# Patient Record
Sex: Female | Born: 1990 | Race: Black or African American | Hispanic: No | Marital: Single | Smoking: Light tobacco smoker
Health system: Southern US, Community
[De-identification: ages and names within clinical notes are randomized; demographics above are authoritative.]

## PROBLEM LIST (undated history)

## (undated) ENCOUNTER — Inpatient Hospital Stay (HOSPITAL_COMMUNITY): Payer: Self-pay

## (undated) DIAGNOSIS — J45909 Unspecified asthma, uncomplicated: Secondary | ICD-10-CM

## (undated) DIAGNOSIS — O1490 Unspecified pre-eclampsia, unspecified trimester: Secondary | ICD-10-CM

## (undated) HISTORY — PX: TONSILLECTOMY: SUR1361

---
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
## 2010-03-16 ENCOUNTER — Ambulatory Visit: Payer: Self-pay | Admitting: Obstetrics and Gynecology

## 2010-03-17 ENCOUNTER — Encounter: Payer: Self-pay | Admitting: Obstetrics and Gynecology

## 2010-06-14 ENCOUNTER — Ambulatory Visit: Payer: Self-pay | Admitting: Obstetrics and Gynecology

## 2010-06-14 ENCOUNTER — Inpatient Hospital Stay (HOSPITAL_COMMUNITY): Admission: AD | Admit: 2010-06-14 | Discharge: 2010-06-14 | Payer: Self-pay | Admitting: Obstetrics & Gynecology

## 2011-02-19 LAB — GC/CHLAMYDIA PROBE AMP, GENITAL
Chlamydia, DNA Probe: NEGATIVE
GC Probe Amp, Genital: NEGATIVE

## 2011-02-19 LAB — URINALYSIS, ROUTINE W REFLEX MICROSCOPIC
Glucose, UA: NEGATIVE mg/dL
Specific Gravity, Urine: <1.005 — ABNORMAL LOW (ref 1.005–1.030)
pH: 5.5 (ref 5.0–8.0)

## 2011-02-19 LAB — POCT PREGNANCY, URINE: Preg Test, Ur: NEGATIVE

## 2011-02-19 LAB — WET PREP, GENITAL: Yeast Wet Prep HPF POC: NONE SEEN

## 2011-03-10 LAB — POCT URINALYSIS DIP (DEVICE)
Ketones, ur: NEGATIVE mg/dL
Protein, ur: 30 mg/dL — AB
Specific Gravity, Urine: 1.015 (ref 1.005–1.030)
Urobilinogen, UA: 1 mg/dL (ref 0.0–1.0)

## 2011-03-10 LAB — POCT I-STAT, CHEM 8
BUN: 7 mg/dL (ref 6–23)
Creatinine, Ser: 0.9 mg/dL (ref 0.4–1.2)
Glucose, Bld: 68 mg/dL — ABNORMAL LOW (ref 70–99)
Hemoglobin: 15.3 g/dL — ABNORMAL HIGH (ref 12.0–15.0)
Potassium: 3.8 mEq/L (ref 3.5–5.1)
TCO2: 28 mmol/L (ref 0–100)

## 2011-03-10 LAB — URINE CULTURE

## 2011-04-10 ENCOUNTER — Inpatient Hospital Stay (INDEPENDENT_AMBULATORY_CARE_PROVIDER_SITE_OTHER)
Admission: RE | Admit: 2011-04-10 | Discharge: 2011-04-10 | Disposition: A | Discharge status: Home / Self Care | Payer: Medicaid Other | Source: Ambulatory Visit | Attending: Emergency Medicine | Admitting: Emergency Medicine

## 2011-04-10 DIAGNOSIS — L989 Disorder of the skin and subcutaneous tissue, unspecified: Secondary | ICD-10-CM

## 2011-04-11 LAB — RPR: RPR Ser Ql: NONREACTIVE

## 2011-04-18 NOTE — Discharge Summary (Signed)
Stacy Christian, Stacy Christian            ACCOUNT NO.:  [id number]   MEDICAL RECORD NO.:  [id number]          PATIENT TYPE:  INP   LOCATION:  9303                          [facility]   PHYSICIAN:  Tanya S. Shawnie Pons, M.D.   DATE OF BIRTH:  12-16-1990   DATE OF ADMISSION:  06/26/2007  DATE OF DISCHARGE:  06/29/2007                               DISCHARGE SUMMARY   ATTENDING AT DISCHARGE:  Tinnie Gens.   REASON FOR ADMISSION:  Complete cervical dilation at 23 and 5/7 weeks  and pregnancy.   COURSE AND TREATMENT RENDERED:  Ms. Stacy Christian is a 20 year old G1 at 22  and 5 weeks admitted for complete cervical dilatation.  She was in town  visiting from [place], [place]. and was noted to have some pink  discharge.  She proceeded to go to [facility] where she was  checked and thought to have open cervical os and thus was transferred to  [facility].  When she arrived she was found to be in  complete dilation with breech presentation.  Baby proceeded to partially  progress into the vagina.  Options for delivery were discussed with  patient including breech delivery vs. primary low vertical C-section.  Patient chose to proceed with primary low vertical C-section.  NICU team  was notified.  Patient delivered by primary low vertical C-section on  June 26, 2007.  A C-section was performed by Dr. Blima Rich.  Patient was found to have a 40% concealed abruptio placenta at delivery.  EBL for the procedure was 600 mL.  The patient was under spinal  anesthesia for the procedure.  The patient delivered a viable female  with Apgars 1 and 6 at one and five minutes respectively.  Cord pH for  the infant was 7.32.  Infant's weight was 1 pound 7 ounces, length 12  inches.   MATERNAL LABS:  Blood type A positive, antibody screen negative, HIV  negative, RPR nonreactive, Rubella immune.  Postpartum hemoglobin and  hematocrit on June 27, 2007, were 8.8 and 24.8 respectively.   FINAL  DIAGNOSIS:  A 20 year old gravida 1 status post primary low  vertical cesarean section on June 26, 2007, with delivery of viable  female.   CONDITION ON DISCHARGE:  Good.   INSTRUCTIONS TO PATIENT:  1. Activity.  No heavy lifting or anything in vagina for the next 6      weeks.  2. Diet.  Routine.   MEDICATIONS AT DISCHARGE:  1. Motrin 600 mg one tab p.o. q.6h. p.r.n. pain.  2. Colace 100 mg, one tab p.o. b.i.d. p.r.n. pain.  3. Percocet 5/325, 1-2 tabs p.o. q.4-6h. p.r.n. pain.   Patient is to follow up in approximately 6 weeks at [facility] in [place], [place] where she received her prenatal care. She is  never to labor again.      Ancil Boozer, MD      Shelbie Proctor. Shawnie Pons, M.D.  Electronically Signed    SA/MEDQ  D:  06/29/2007  T:  06/30/2007  Job:  [id number]

## 2011-04-18 NOTE — Group Therapy Note (Signed)
Stacy Christian, Stacy Christian NO.:  [id number]   MEDICAL RECORD NO.:  [id number]          PATIENT TYPE:  WOC   LOCATION:  [facility]                   FACILITY:  [facility]   PHYSICIAN:  [name], MD        DATE OF BIRTH:  01/09/1991   DATE OF SERVICE:  07/08/2008                                  CLINIC NOTE   REASON FOR VISIT:  Desires birth control.   HISTORY:  This is a 20 year old G1, P 0-1-0-0 who had a preterm birth a  year ago around 66 to 49 weeks, uncertain gestational age.  However, the  weight was 1 pound 7.  There was an early neonatal death at 2 weeks,  transferred from NICU here to [facility].  Of note, she had  spontaneous onset of labor and uncomplicated pregnancy.  History  consistent with cervical weakness.  She did have a C-section.  She did  have a Depo shot after her delivery and had a Pap smear in October of  2008, which was normal.  This was done in [place] where she had gone up to  live with her grandmother.  She is currently not sexually active, does  not have a boyfriend at present.  She did have some lower pelvic  pressure with urination a couple of days ago, which spontaneously  resolved.  She has no irritative vaginal symptoms and has no other  concerns.   ALLERGIES:  None.   IMMUNIZATIONS:  Usual childhood immunizations.   MENSTRUAL HISTORY:  12 x 28 x 5, medium flow, mild dysmenorrhea.  She  did have some intermenstrual bleeding after Depo, but not at present.  She has never been on any other contraception except a Depo shot  postpartum and she is unhappy with that method.  She has had two  partners, never had STIs.   OBSTETRICAL HISTORY:  As above.   GYN HISTORY:  Never had abnormal Pap smear.   SURGERIES:  C-section June 26, 2007.   FAMILY HISTORY:  Is significant for diabetes, hypertension in both  parents.   PAST MEDICAL HISTORY:  She has not had pyelonephritis, but has had what  sounds like asymptomatic bacteriuria during  pregnancy.  She has a  history of asthma.   SOCIAL HISTORY:  Lives with mother.  Is a Chief Strategy Officer at [employer].  Nonsmoker.  Does not drink alcohol.  Drinks 3 caffeinated  beverages a day.  Denies sexual abuse history.   REVIEW OF SYSTEMS:  Negative except as in HPI.  She has had no sudden  change in weight, but has had a longstanding problem of being  overweight.   PHYSICAL EXAM:  Temperature 98.9, pulse 86, blood pressure 127/80,  weight 200 pounds, height 5 feet 4-1/2 inches.  GENERAL:  Well developed, well nourished.  Obese. NAD.  HEENT:  Good dentition.  Normocephalic.  NECK/THYROID:  NSST.  HEART:  Regular rate, rhythm without murmur.  LUNGS:  Clear to auscultation bilateral.  BREASTS:  Soft, symmetric, no discrete masses.  Nipples flat, no nipple  discharge.  No lymphadenopathy.  ABDOMEN:  Protuberant, soft, nontender.  No organomegaly.  Good muscle  tone.  PELVIC:  Normal external female genitalia.  Tanner five.  Vagina pink,  well rugated.  Physiologic discharge.  Cervix parous os.  No lesions.  Mid position.  BIMANUAL:  Uterus NSST.  Adnexa without tenderness sor masses.  EXTREMITIES:  No edema.  Pulses full and equal.   ASSESSMENT:  Needs contraception.  Poor obstetrical history consistent  with cervical weakness,  previous C-section, obesity.   PLAN:  As far as health care maintenance, discussed diet and weight loss  extensively.  She is urged to decrease empty calorie foods and increase  exercise, especially walking as much as possible.  She is given a  prescription today for Sprintec contraception to begin the first Sunday  after menses.  She is also told that if pregnancy occurs in the future,  she needs to come in for very early prenatal care due to her obstetrical  history.  Also discussed seatbelt use and the risks, benefits of  Gardasil.  She will receive Gardasil this visit and return in 2 months  to see how she is doing on the Sprintec and for  her second Gardasil.  She is also given full  SDI testing today including  GC, chlamydia, RPR,  HIV and hep-C.     ______________________________  Caren Griffins, CNM    ______________________________  [name], MD    DP/MEDQ  D:  07/08/2008  T:  07/08/2008  Job:  [id number]

## 2011-04-18 NOTE — Op Note (Signed)
[name], Christian            ACCOUNT NO.:  [id number]   MEDICAL RECORD NO.:  [id number]          PATIENT TYPE:  INP   LOCATION:  9303                          [facility]   PHYSICIAN:  Ginger Carne, MD  DATE OF BIRTH:  1991/02/13   DATE OF PROCEDURE:  06/26/2007  DATE OF DISCHARGE:                               OPERATIVE REPORT   PREOPERATIVE DIAGNOSIS:  23-4/[redacted] weeks gestation, preterm labor, breech  presentation, full dilation.   POSTOPERATIVE DIAGNOSIS:  23-4/[redacted] weeks gestation, preterm labor, breech  presentation, full dilation. 40% concealed abruptio placenta.  Preterm  viable delivery of female infant.   PROCEDURE:  Primary low vertical cesarean section.   SURGEON:  Ginger Carne, M.D.   ASSISTANT:  None.   COMPLICATIONS:  None immediate.   ESTIMATED BLOOD LOSS:  600 mL.   ANESTHESIA:  Spinal.   SPECIMEN:  Cord pH, cord blood and placenta to pathology.   OPERATIVE FINDINGS:  Is a preterm frank breech presentation fetus  female, no gross abnormalities.  Baby did not cry spontaneously at  delivery.  Amniotic fluid was meconium stained.  Apgar 01/06, cord pH  7.32, weight pending.  The placenta revealed 40% concealed abruption.  Three-vessel cord central insertion, complete placenta.  Uterus, tubes  and ovaries showed normal decidual changes of pregnancy.   OPERATIVE PROCEDURE:  The patient prepped and draped in usual fashion  and placed in the left lateral supine position.  Betadine solution used  for antiseptic and the patient was catheterized prior to procedure.  After adequate spinal analgesia a Pfannenstiel incision was made and the  abdomen opened.  A low vertical incision was made in the lower uterine  segment but part extended into the fundal portion of the uterus to  effect safe delivery of the head.  The bladder flap was dissected  appropriately, baby delivered as a full breech extraction.  Cord clamped  and cut and infant given to the  pediatric staff after bulb suctioning.  Placenta removed manually.  Uterus inspected. Closure of the incision  one layer was 0 Vicryl running interlocking suture.  Bleeding points  hemostatically checked.  Blood  clots removed.  Closure of the fascia in one layer with 0 Vicryl running  suture.  The skin staples for the skin edges. Instrument and sponge  count were correct.  The patient tolerated the procedure well, returned  to the post anesthesia recovery room in excellent condition.      Ginger Carne, MD  Electronically Signed     SHB/MEDQ  D:  06/26/2007  T:  06/26/2007  Job:  [id number]

## 2011-09-01 LAB — POCT URINALYSIS DIP (DEVICE)
Bilirubin Urine: NEGATIVE
Hgb urine dipstick: NEGATIVE
Nitrite: NEGATIVE
Urobilinogen, UA: 1
pH: 8.5 — ABNORMAL HIGH

## 2011-09-18 LAB — URINALYSIS, ROUTINE W REFLEX MICROSCOPIC
Bilirubin Urine: NEGATIVE
Ketones, ur: >80 — AB
Nitrite: NEGATIVE
Protein, ur: NEGATIVE
Specific Gravity, Urine: >1.03 — ABNORMAL HIGH
Urobilinogen, UA: 0.2

## 2011-09-18 LAB — CBC
HCT: 24.8 — ABNORMAL LOW
Hemoglobin: 8.8 — ABNORMAL LOW
MCHC: 35.2
MCV: 84.2
Platelets: 264
RBC: 2.93 — ABNORMAL LOW
RBC: 3.71 — ABNORMAL LOW
RDW: 13.6
WBC: 22.2 — ABNORMAL HIGH

## 2011-09-18 LAB — DIFFERENTIAL
Basophils Absolute: 0
Basophils Relative: 0
Eosinophils Absolute: 0
Monocytes Relative: 3
Neutro Abs: 17.6 — ABNORMAL HIGH
Neutrophils Relative %: 92 — ABNORMAL HIGH

## 2011-09-18 LAB — STREP B DNA PROBE

## 2011-09-18 LAB — COMPREHENSIVE METABOLIC PANEL
ALT: 12
AST: 18
Alkaline Phosphatase: 77
CO2: 22
Calcium: 8.5
Potassium: 3.4 — ABNORMAL LOW
Sodium: 134 — ABNORMAL LOW
Total Protein: 6.8

## 2011-09-18 LAB — RAPID URINE DRUG SCREEN, HOSP PERFORMED
Amphetamines: NOT DETECTED
Barbiturates: NOT DETECTED
Benzodiazepines: NOT DETECTED
Cocaine: NOT DETECTED
Opiates: NOT DETECTED

## 2011-09-18 LAB — TYPE AND SCREEN: ABO/RH(D): A POS

## 2011-09-18 LAB — URINE MICROSCOPIC-ADD ON

## 2011-09-18 LAB — RPR: RPR Ser Ql: NONREACTIVE

## 2011-09-18 LAB — ABO/RH: ABO/RH(D): A POS

## 2011-09-18 LAB — RAPID HIV SCREEN (WH-MAU): Rapid HIV Screen: NONREACTIVE

## 2012-09-08 ENCOUNTER — Encounter (HOSPITAL_COMMUNITY): Payer: Self-pay | Admitting: Emergency Medicine

## 2012-09-08 ENCOUNTER — Emergency Department (HOSPITAL_COMMUNITY)
Admission: EM | Admit: 2012-09-08 | Discharge: 2012-09-08 | Disposition: A | Discharge status: Home / Self Care | Payer: Self-pay | Attending: Emergency Medicine | Admitting: Emergency Medicine

## 2012-09-08 DIAGNOSIS — K089 Disorder of teeth and supporting structures, unspecified: Secondary | ICD-10-CM | POA: Insufficient documentation

## 2012-09-08 DIAGNOSIS — K0889 Other specified disorders of teeth and supporting structures: Secondary | ICD-10-CM

## 2012-09-08 DIAGNOSIS — F17200 Nicotine dependence, unspecified, uncomplicated: Secondary | ICD-10-CM | POA: Insufficient documentation

## 2012-09-08 HISTORY — DX: Unspecified asthma, uncomplicated: J45.909

## 2012-09-08 MED ORDER — PENICILLIN V POTASSIUM 500 MG PO TABS: 1000.0000 mg | ORAL_TABLET | Freq: Two times a day (BID) | ORAL | Status: DC

## 2012-09-08 MED ORDER — OXYCODONE-ACETAMINOPHEN 5-325 MG PO TABS: 2.0000 | ORAL_TABLET | Freq: Three times a day (TID) | ORAL | Status: DC | PRN

## 2012-09-08 NOTE — ED Provider Notes (Signed)
History  This chart was scribed for Stacy Horn, MD by Erskine Emery. This patient was seen in room TR07C/TR07C and the patient's care was started at 18:55.   CSN: [id number]  Arrival date & time 09/08/12  1555   None     Chief Complaint  Patient presents with  . Dental Pain    right lower tooth    (Consider location/radiation/quality/duration/timing/severity/associated sxs/prior treatment) The history is provided by the patient. No language interpreter was used.  Stacy Christian is a 21 y.o. female who presents to the Emergency Department complaining of right lower jaw pain that shoots up the side of her face with associated gum swelling for the last several days. Pt has not yet seen someone for the complaint. Pt denies any associated chest pain, abdominal pain, SOB, or extremity pain. Pt has had a temporary filling in that area for about a year since a previous filling in that tooth fell out. Pt has a h/o asthma but is otherwise healthy.  Pt doesn't have a dentist.  Past Medical History  Diagnosis Date  . Asthma     Past Surgical History  Procedure Date  . Cesarean section   . Tonsillectomy     History reviewed. No pertinent family history.  History  Substance Use Topics  . Smoking status: Current Every Day Smoker  . Smokeless tobacco: Not on file  . Alcohol Use: No    OB History    Grav Para Term Preterm Abortions TAB SAB Ect Mult Living                  Review of Systems 10 Systems reviewed and are negative for acute change except as noted in the HPI.   Allergies  Review of patient's allergies indicates no known allergies.  Home Medications   Current Outpatient Rx  Name Route Sig Dispense Refill  . EXCEDRIN PO Oral Take 2 tablets by mouth every 12 (twelve) hours as needed. For pain    . BENZOCAINE 10 % MT GEL Mouth/Throat Use as directed 1 application in the mouth or throat 3 (three) times daily as needed. For pain    . IBUPROFEN 200 MG PO TABS  Oral Take 200 mg by mouth every 6 (six) hours as needed. For pain    . OXYCODONE-ACETAMINOPHEN 5-325 MG PO TABS Oral Take 2 tablets by mouth every 8 (eight) hours as needed for pain. 10 tablet 0  . PENICILLIN V POTASSIUM 500 MG PO TABS Oral Take 2 tablets (1,000 mg total) by mouth 2 (two) times daily. X 7 days 28 tablet 0    BP 136/88  Pulse 62  Temp 99.2 F (37.3 C) (Oral)  Resp 16  SpO2 100%  LMP 09/05/2012  Physical Exam  Nursing note and vitals reviewed. Constitutional:       Awake, alert, nontoxic appearance.  HENT:  Head: Atraumatic.  Right Ear: External ear normal.  Left Ear: External ear normal.  Mouth/Throat: Oropharynx is clear and moist. No oropharyngeal exudate.       Back of throat is normal. Tooth #30: mild tenderness to percussion with mild localized gingival tenderness, no fluctuants or swelling. Tooth #30 has a small temporary filling.  Eyes: Right eye exhibits no discharge. Left eye exhibits no discharge.  Neck: Neck supple.  Cardiovascular: Normal rate and regular rhythm.   Pulmonary/Chest: Effort normal. No respiratory distress. She has no wheezes. She exhibits no tenderness.  Abdominal: Soft. There is no tenderness. There is no rebound.  Musculoskeletal: She exhibits no tenderness.       Baseline ROM, no obvious new focal weakness.  Lymphadenopathy:    She has no cervical adenopathy.  Neurological:       Mental status and motor strength appears baseline for patient and situation.  Skin: No rash noted.  Psychiatric: She has a normal mood and affect.    ED Course  Procedures (including critical care time) DIAGNOSTIC STUDIES: Oxygen Saturation is 100% on room air, normal by my interpretation.    COORDINATION OF CARE: 18:55--Patient / Family / Caregiver informed of clinical course, understand medical decision-making process, and agree with plan.   Labs Reviewed - No data to display No results found.   1. Pain, dental       MDM   Patient /  Family / Caregiver informed of clinical course, understand medical decision-making process, and agree with plan. I personally performed the services described in this documentation, which was scribed in my presence. The recorded information has been reviewed and considered. I doubt any other EMC precluding discharge at this time.  Stacy Horn, MD 09/10/12 [phone]

## 2012-09-08 NOTE — ED Notes (Signed)
Pt c/o right lower back tooth pain with gum swelling. Pt reports has temporary filling in tooth in front.

## 2013-03-10 ENCOUNTER — Encounter (HOSPITAL_COMMUNITY): Payer: Self-pay | Admitting: *Deleted

## 2013-03-10 ENCOUNTER — Emergency Department (INDEPENDENT_AMBULATORY_CARE_PROVIDER_SITE_OTHER)
Admission: EM | Admit: 2013-03-10 | Discharge: 2013-03-10 | Disposition: A | Discharge status: Home / Self Care | Payer: Self-pay | Source: Home / Self Care | Attending: Family Medicine | Admitting: Family Medicine

## 2013-03-10 DIAGNOSIS — K0889 Other specified disorders of teeth and supporting structures: Secondary | ICD-10-CM

## 2013-03-10 DIAGNOSIS — K089 Disorder of teeth and supporting structures, unspecified: Secondary | ICD-10-CM

## 2013-03-10 MED ORDER — OXYCODONE-ACETAMINOPHEN 5-325 MG PO TABS: 1.0000 | ORAL_TABLET | Freq: Four times a day (QID) | ORAL | Status: DC | PRN

## 2013-03-10 MED ORDER — PENICILLIN V POTASSIUM 500 MG PO TABS: 500.0000 mg | ORAL_TABLET | Freq: Four times a day (QID) | ORAL | Status: AC

## 2013-03-10 NOTE — ED Provider Notes (Signed)
History     CSN: [id number]  Arrival date & time 03/10/13  1640   First MD Initiated Contact with Patient 03/10/13 1836      Chief Complaint  Patient presents with  . Dental Pain    (Consider location/radiation/quality/duration/timing/severity/associated sxs/prior treatment) HPI Comments: Pt seen in ER for same last fall.  Has not seen dentist.    Patient is a 22 y.o. female presenting with tooth pain. The history is provided by the patient.  Dental PainPrimary symptoms do not include fever. Primary symptoms comment: R lower tooth pain. Episode onset: 5 days ago. The symptoms are unchanged. The symptoms are recurrent. The symptoms occur constantly.  Additional symptoms include: dental sensitivity to temperature, gum swelling and gum tenderness.    Past Medical History  Diagnosis Date  . Asthma     Past Surgical History  Procedure Laterality Date  . Cesarean section    . Tonsillectomy      History reviewed. No pertinent family history.  History  Substance Use Topics  . Smoking status: Current Every Day Smoker  . Smokeless tobacco: Not on file  . Alcohol Use: No    OB History   Grav Para Term Preterm Abortions TAB SAB Ect Mult Living                  Review of Systems  Constitutional: Negative for fever and chills.  HENT: Positive for dental problem.     Allergies  Review of patient's allergies indicates no known allergies.  Home Medications   Current Outpatient Rx  Name  Route  Sig  Dispense  Refill  . Aspirin-Acetaminophen-Caffeine (EXCEDRIN PO)   Oral   Take 2 tablets by mouth every 12 (twelve) hours as needed. For pain         . benzocaine (ORAJEL) 10 % mucosal gel   Mouth/Throat   Use as directed 1 application in the mouth or throat 3 (three) times daily as needed. For pain         . ibuprofen (ADVIL,MOTRIN) 200 MG tablet   Oral   Take 200 mg by mouth every 6 (six) hours as needed. For pain         . oxyCODONE-acetaminophen  (PERCOCET/ROXICET) 5-325 MG per tablet   Oral   Take 1-2 tablets by mouth every 6 (six) hours as needed for pain.   6 tablet   0   . penicillin v potassium (VEETID) 500 MG tablet   Oral   Take 1 tablet (500 mg total) by mouth 4 (four) times daily.   40 tablet   0     BP 142/80  Pulse 81  Temp(Src) 99.4 F (37.4 C) (Oral)  Resp 16  SpO2 99%  LMP 02/10/2013  Physical Exam  Constitutional: She appears well-developed and well-nourished. No distress.  HENT:  Mouth/Throat:    No swelling under tongue. Mild tenderness to palp gums adjacent to problem tooth. No evidence abscess.   Lymphadenopathy:       Head (right side): No submental, no submandibular and no tonsillar adenopathy present.       Head (left side): No submental, no submandibular and no tonsillar adenopathy present.    ED Course  Procedures (including critical care time)  Labs Reviewed - No data to display No results found.   1. Pain, dental       MDM          Cathlyn Parsons, NP 03/10/13 [phone]

## 2013-03-10 NOTE — ED Notes (Signed)
Patient complains of lower right jaw pain x 5 days. Patient states swelling but has been using goody's powder to reduce swelling.

## 2013-03-12 NOTE — ED Provider Notes (Signed)
Medical screening examination/treatment/procedure(s) were performed by resident physician or non-physician practitioner and as supervising physician I was immediately available for consultation/collaboration.   KINDL,JAMES DOUGLAS MD.   James D Kindl, MD 03/12/13 1524 

## 2014-05-05 ENCOUNTER — Ambulatory Visit: Payer: Self-pay | Admitting: Internal Medicine

## 2014-07-23 ENCOUNTER — Emergency Department (INDEPENDENT_AMBULATORY_CARE_PROVIDER_SITE_OTHER)
Admission: EM | Admit: 2014-07-23 | Discharge: 2014-07-23 | Disposition: A | Discharge status: Home / Self Care | Payer: Self-pay | Source: Home / Self Care | Attending: Family Medicine | Admitting: Family Medicine

## 2014-07-23 ENCOUNTER — Encounter (HOSPITAL_COMMUNITY): Payer: Self-pay | Admitting: Family Medicine

## 2014-07-23 DIAGNOSIS — K089 Disorder of teeth and supporting structures, unspecified: Secondary | ICD-10-CM

## 2014-07-23 DIAGNOSIS — J45901 Unspecified asthma with (acute) exacerbation: Secondary | ICD-10-CM

## 2014-07-23 DIAGNOSIS — K0889 Other specified disorders of teeth and supporting structures: Secondary | ICD-10-CM

## 2014-07-23 DIAGNOSIS — K0530 Chronic periodontitis, unspecified: Secondary | ICD-10-CM

## 2014-07-23 MED ORDER — PREDNISONE 50 MG PO TABS: ORAL_TABLET | ORAL | Status: DC

## 2014-07-23 MED ORDER — METHYLPREDNISOLONE SODIUM SUCC 125 MG IJ SOLR
INTRAMUSCULAR | Status: AC
  Filled 2014-07-23: qty 2

## 2014-07-23 MED ORDER — CLINDAMYCIN HCL 300 MG PO CAPS: 300.0000 mg | ORAL_CAPSULE | Freq: Three times a day (TID) | ORAL | Status: DC

## 2014-07-23 MED ORDER — IPRATROPIUM BROMIDE 0.02 % IN SOLN
RESPIRATORY_TRACT | Status: AC
  Filled 2014-07-23: qty 2.5

## 2014-07-23 MED ORDER — METHYLPREDNISOLONE SODIUM SUCC 125 MG IJ SOLR: 125.0000 mg | Freq: Once | INTRAMUSCULAR | Status: DC

## 2014-07-23 MED ORDER — IPRATROPIUM-ALBUTEROL 0.5-2.5 (3) MG/3ML IN SOLN
3.0000 mL | RESPIRATORY_TRACT | Status: DC
  Administered 2014-07-23: 3 mL via RESPIRATORY_TRACT

## 2014-07-23 MED ORDER — CHLORHEXIDINE GLUCONATE 0.12 % MT SOLN: 15.0000 mL | Freq: Two times a day (BID) | OROMUCOSAL | Status: DC

## 2014-07-23 MED ORDER — ALBUTEROL SULFATE (2.5 MG/3ML) 0.083% IN NEBU
INHALATION_SOLUTION | RESPIRATORY_TRACT | Status: AC
  Filled 2014-07-23: qty 3

## 2014-07-23 MED ORDER — METHYLPREDNISOLONE SODIUM SUCC 125 MG IJ SOLR
125.0000 mg | Freq: Once | INTRAMUSCULAR | Status: AC
  Administered 2014-07-23: 125 mg via INTRAMUSCULAR

## 2014-07-23 NOTE — Discharge Instructions (Signed)
You are experiencing a dental infection that will need antibiotics to cure Please start the clindamycin and take it until it is complete Please start the mouthwash from time to time to prevent this from happening Please follow up with a dentist Please start the prednisone and use your albuterol every 4 hours for the next 1-2 days for your asthma

## 2014-07-23 NOTE — ED Provider Notes (Signed)
CSN: [id number]     Arrival date & time 07/23/14  1054 History   First MD Initiated Contact with Patient 07/23/14 1125     Chief Complaint  Patient presents with  . Dental Problem   (Consider location/radiation/quality/duration/timing/severity/associated sxs/prior Treatment) HPI  R mandible tooth pain. Cavity in tooth came out last year. Pain off and on. But current episode started last night. Woke pt up. Tried goody powder in the tooth w/o benefit. Warm salt water and vanilla extract w/ some benefit. Unable to eat on R side. Jaw feels tight, and facial swelling. Deneis fevers, CP, sob, palpitations, rash, no airway compromise. GUms bleeding easily when brushing.    Past Medical History  Diagnosis Date  . Asthma    Past Surgical History  Procedure Laterality Date  . Cesarean section    . Tonsillectomy     History reviewed. No pertinent family history. History  Substance Use Topics  . Smoking status: Current Every Day Smoker -- 0.15 packs/day  . Smokeless tobacco: Not on file  . Alcohol Use: No   OB History   Grav Para Term Preterm Abortions TAB SAB Ect Mult Living                 Review of Systems Per HPI with all other pertinent systems negative.   Allergies  Review of patient's allergies indicates no known allergies.  Home Medications   Prior to Admission medications   Medication Sig Start Date End Date Taking? Authorizing Provider  Aspirin-Acetaminophen-Caffeine (EXCEDRIN PO) Take 2 tablets by mouth every 12 (twelve) hours as needed. For pain    Historical Provider, MD  benzocaine (ORAJEL) 10 % mucosal gel Use as directed 1 application in the mouth or throat 3 (three) times daily as needed. For pain    Historical Provider, MD  chlorhexidine (PERIDEX) 0.12 % solution Use as directed 15 mLs in the mouth or throat 2 (two) times daily. 07/23/14   Ozella Rocksavid J Elon Lomeli, MD  clindamycin (CLEOCIN) 300 MG capsule Take 1 capsule (300 mg total) by mouth 3 (three) times daily. 07/23/14    Ozella Rocksavid J Elinda Bunten, MD  ibuprofen (ADVIL,MOTRIN) 200 MG tablet Take 200 mg by mouth every 6 (six) hours as needed. For pain    Historical Provider, MD  oxyCODONE-acetaminophen (PERCOCET/ROXICET) 5-325 MG per tablet Take 1-2 tablets by mouth every 6 (six) hours as needed for pain. 03/10/13   Cathlyn ParsonsAngela M Kabbe, NP  predniSONE (DELTASONE) 50 MG tablet Take daily with breakfast 07/23/14   Ozella Rocksavid J Theona Muhs, MD   BP 151/88  Pulse 72  Temp(Src) 99.4 F (37.4 C) (Oral)  Resp 18  SpO2 99% Physical Exam  Constitutional: She is oriented to person, place, and time. She appears well-developed and well-nourished. No distress.  HENT:  Head: Normocephalic and atraumatic.  R mandibular molar (2nd from back) w/ large cavitary lesion on the lateral aspect. No purulence or bloody discharge. No fluctuance to gums but very ttp.   Eyes: EOM are normal. Pupils are equal, round, and reactive to light.  Neck: Normal range of motion.  Cardiovascular: Normal rate, normal heart sounds and intact distal pulses.   Pulmonary/Chest: Effort normal. She has wheezes. She has no rales. She exhibits no tenderness.  Abdominal: She exhibits no distension.  Musculoskeletal: Normal range of motion. She exhibits no edema and no tenderness.  Lymphadenopathy:    She has no cervical adenopathy.  Neurological: She is alert and oriented to person, place, and time. No cranial nerve deficit.  Skin:  Skin is warm and dry. No rash noted. She is not diaphoretic. No erythema. No pallor.  Psychiatric: She has a normal mood and affect. Her behavior is normal. Thought content normal.    ED Course  Procedures (including critical care time) Labs Review Labs Reviewed - No data to display  Imaging Review No results found.   MDM   1. Asthma exacerbation   2. Dentalgia   3. Periodontitis    Asthma: duoneb adn solumedrol 125 in office. Start prednisone at home and albuterol Q4 x 24-48 hrs  Dental infection: start clinda and Peridex. Pt given  dental resources int he community. Deferring dental block - tylneol prn pain, steroids as above    Precautions given and all questions answered  Shelly Flatten, MD Family Medicine 07/23/2014, 11:40 AM   Ozella Rocks, MD 07/23/14 1141

## 2014-07-23 NOTE — ED Notes (Signed)
C/o dental pain since last PM; minimal relief w OTC medications

## 2014-09-02 ENCOUNTER — Ambulatory Visit: Payer: Self-pay

## 2014-12-23 ENCOUNTER — Inpatient Hospital Stay (HOSPITAL_COMMUNITY)
Admission: AD | Admit: 2014-12-23 | Discharge: 2014-12-23 | Disposition: A | Discharge status: Home / Self Care | Payer: Self-pay | Source: Ambulatory Visit | Attending: Obstetrics & Gynecology | Admitting: Obstetrics & Gynecology

## 2014-12-23 ENCOUNTER — Encounter (HOSPITAL_COMMUNITY): Payer: Self-pay | Admitting: *Deleted

## 2014-12-23 ENCOUNTER — Inpatient Hospital Stay (HOSPITAL_COMMUNITY): Payer: Self-pay

## 2014-12-23 DIAGNOSIS — O4690 Antepartum hemorrhage, unspecified, unspecified trimester: Secondary | ICD-10-CM

## 2014-12-23 DIAGNOSIS — O034 Incomplete spontaneous abortion without complication: Secondary | ICD-10-CM

## 2014-12-23 DIAGNOSIS — O99331 Smoking (tobacco) complicating pregnancy, first trimester: Secondary | ICD-10-CM | POA: Insufficient documentation

## 2014-12-23 DIAGNOSIS — Z3A11 11 weeks gestation of pregnancy: Secondary | ICD-10-CM | POA: Insufficient documentation

## 2014-12-23 LAB — URINE MICROSCOPIC-ADD ON

## 2014-12-23 LAB — URINALYSIS, ROUTINE W REFLEX MICROSCOPIC
Bilirubin Urine: NEGATIVE
GLUCOSE, UA: NEGATIVE mg/dL
KETONES UR: NEGATIVE mg/dL
LEUKOCYTES UA: NEGATIVE
NITRITE: NEGATIVE
Protein, ur: NEGATIVE mg/dL
SPECIFIC GRAVITY, URINE: 1.015 (ref 1.005–1.030)
UROBILINOGEN UA: 0.2 mg/dL (ref 0.0–1.0)
pH: 7 (ref 5.0–8.0)

## 2014-12-23 LAB — WET PREP, GENITAL
Trich, Wet Prep: NONE SEEN
Yeast Wet Prep HPF POC: NONE SEEN

## 2014-12-23 LAB — RAPID HIV SCREEN (WH-MAU): SUDS RAPID HIV SCREEN: NONREACTIVE

## 2014-12-23 LAB — POCT PREGNANCY, URINE: Preg Test, Ur: POSITIVE — AB

## 2014-12-23 MED ORDER — PROMETHAZINE HCL 25 MG PO TABS: 12.5000 mg | ORAL_TABLET | Freq: Four times a day (QID) | ORAL | Status: DC | PRN

## 2014-12-23 MED ORDER — OXYCODONE-ACETAMINOPHEN 5-325 MG PO TABS: 1.0000 | ORAL_TABLET | ORAL | Status: DC | PRN

## 2014-12-23 NOTE — MAU Note (Signed)
Pos UPT @ [facility] on 11/17/14.  Started spotting last night, again this a.m.  Was having lower abd pain earlier this morning, not now.  Started having diarrhea this morning - 1 episode.  Denies vomiting.

## 2014-12-23 NOTE — Progress Notes (Signed)
Called to ED by nurse who said pt was told baby did not have a heartbeat and was tearful. She also explained pt was not accepting the news and was still holding out on hope. In addition she explained baby stopped growing at 7 weeks. Pt was lying in bed and very tearful when I arrived. Pt told me part of what nurse told her. I asked pt questions to also verify information nurse had given me and pt articulated additional information based on questions. Pt said she does not want to accept what she has been told. She believes it could still happen and her baby could live. Pt articulated that nurse told her the baby stopped growing at 7 wks according to tests. Pt still decided to wait two weeks. She said she wanted to wait and let her body and nature take its course. Pt's boyfriend arrived and he immediately said his faith was too strong and he was not going to give up. He said a family member was told the same thing and they just couldn't find a heart beat. The nurse again explained the baby stopped growing at 7 wks. The couple still made the decision to wait. Based on their belief and faith, I supported the couple emotionally and told them however God chooses, it is a miracle. It is a miracle if he takes this baby from the mother's womb to Himself and it is a miracle if a heartbeat is found later. They accepted and seemed to appreciate both aspects as miracles. We talked about how God has knitted this child in its mother's womb and it is up to Him as to when the work is done. Pt said our visit was very helpful and her boyfriend was very Adult nurseappreciative. Their faith was very apparent and too strong to be ignored or discounted as were the results of the ultrasound. After prayer, they still wanted to wait two weeks. Pt asked nurse what she might expect over the next two weeks should the baby not come to term and the nurse explained the process as nature takes its course in the loss. Pt and boyfriend were very appreciative.  Had prayer with couple. They left open to accept the will of God vs not accepting a loss at all.  Marjory Liesamela Carrington Holder Chaplain   12/23/14 1700  Clinical Encounter Type  Visited With Patient;Patient and family together

## 2014-12-23 NOTE — MAU Provider Note (Signed)
History     CSN: [id number]  Arrival date and time: 12/23/14 1122   First Provider Initiated Contact with Patient 12/23/14 1244      Chief Complaint  Patient presents with  . Vaginal Bleeding   HPI  CT is a 24 y.o. G2P0100 3121w3d presenting with vaginal bleeding. The vaginal bleeding started last night when she went to wipe after voiding and noticed a light pink color on the toilet tissue. This morning she noticed spotting. She put on a panty liner this morning and has not soaked through the panty liner. Of note, she is also experiencing intermittent abdominal cramping in the suprapubic region. Onset was also last night. When cramping the pain is a 6-7/10. She is concerned because she has a h/o of PTL with her first pregnancy. Furthermore, she was also late to prenatal care with her first pregnancy. Her first pregnancy was at 24yo she went into PTL and gave birth to a viable female at 6 months GA via CS secondary to breech presentation. Daughter expired at 2 weeks and 1 day. H/o of borderline high blood sugars during first pregnancy. Denies h/o cervical insufficiency or incompetence.   Denies fever, chills, night sweat, nausea, vomiting  Sexually active x 1 partner, no protection during intercourse. Prior h/o chlamydia, trichomonas 3 years ago.  Scheduled for her first prenatal visit at the [facility] for Thursday 12/24/14.   H/o HTN no medication.    OB History    Gravida Para Term Preterm AB TAB SAB Ectopic Multiple Living   2 1  1             Past Medical History  Diagnosis Date  . Asthma     Past Surgical History  Procedure Laterality Date  . Cesarean section    . Tonsillectomy      History reviewed. No pertinent family history.  History  Substance Use Topics  . Smoking status: Current Every Day Smoker -- 0.15 packs/day  . Smokeless tobacco: Not on file  . Alcohol Use: No    Allergies: No Known Allergies  Prescriptions prior to admission  Medication Sig Dispense Refill  Last Dose  . Prenatal Vit-Fe Fumarate-FA (PRENATAL MULTIVITAMIN) TABS tablet Take 1 tablet by mouth daily at 12 noon.   12/23/2014 at Unknown time  . benzocaine (ORAJEL) 10 % mucosal gel Use as directed 1 application in the mouth or throat 3 (three) times daily as needed. For pain   Unknown at Unknown time  . chlorhexidine (PERIDEX) 0.12 % solution Use as directed 15 mLs in the mouth or throat 2 (two) times daily. (Patient not taking: Reported on 12/23/2014) 120 mL 0   . clindamycin (CLEOCIN) 300 MG capsule Take 1 capsule (300 mg total) by mouth 3 (three) times daily. (Patient not taking: Reported on 12/23/2014) 30 capsule 0   . oxyCODONE-acetaminophen (PERCOCET/ROXICET) 5-325 MG per tablet Take 1-2 tablets by mouth every 6 (six) hours as needed for pain. (Patient not taking: Reported on 12/23/2014) 6 tablet 0 Unknown at Unknown time  . predniSONE (DELTASONE) 50 MG tablet Take daily with breakfast (Patient not taking: Reported on 12/23/2014) 5 tablet 0     Review of Systems  Constitutional: Positive for malaise/fatigue and diaphoresis. Negative for fever and chills.  Eyes: Negative for blurred vision.  Respiratory: Negative for cough and shortness of breath.   Cardiovascular: Negative for chest pain and palpitations.  Gastrointestinal: Positive for abdominal pain and diarrhea (This morning - consistency between diarrhea and loose stool. No blood. ).  Negative for nausea, vomiting, blood in stool and melena.  Genitourinary: Positive for hematuria. Negative for dysuria and flank pain.       Malodorous  Neurological: Negative for dizziness and headaches.  Psychiatric/Behavioral: Negative for depression. The patient is nervous/anxious.    Physical Exam   Blood pressure 135/74, pulse 84, temperature 99.5 F (37.5 C), temperature source Oral, resp. rate 18, height  (1.676 m), weight 70.761 kg (156 lb), last menstrual period 09/03/2014.  Physical Exam  Constitutional: She is oriented to person,  place, and time. She appears well-developed and well-nourished. No distress.  HENT:  Head: Normocephalic and atraumatic.  Neck: Normal range of motion.  Cardiovascular: Normal heart sounds and intact distal pulses.   Respiratory: Effort normal and breath sounds normal.  GI: Soft. Bowel sounds are normal. There is no tenderness. There is no guarding.  Genitourinary: Pelvic exam was performed with patient prone. There is no rash, tenderness or lesion on the right labia. There is no rash, tenderness or lesion on the left labia. Cervix exhibits discharge (small amount of blood). Cervix exhibits no motion tenderness and no friability. Right adnexum displays no mass and no tenderness. Left adnexum displays no mass and no tenderness. There is bleeding (small amount of blood) in the vagina. No erythema or tenderness in the vagina. No foreign body around the vagina. No signs of injury around the vagina.  Musculoskeletal: Normal range of motion.  Neurological: She is alert and oriented to person, place, and time.  Skin: Skin is warm and dry.  Psychiatric: Her behavior is normal. Judgment normal.   Results for orders placed or performed during the hospital encounter of 12/23/14 (from the past 24 hour(s))  Urinalysis, Routine w reflex microscopic     Status: Abnormal   Collection Time: 12/23/14 11:50 AM  Result Value Ref Range   Color, Urine YELLOW YELLOW   APPearance CLEAR CLEAR   Specific Gravity, Urine 1.015 1.005 - 1.030   pH 7.0 5.0 - 8.0   Glucose, UA NEGATIVE NEGATIVE mg/dL   Hgb urine dipstick SMALL (A) NEGATIVE   Bilirubin Urine NEGATIVE NEGATIVE   Ketones, ur NEGATIVE NEGATIVE mg/dL   Protein, ur NEGATIVE NEGATIVE mg/dL   Urobilinogen, UA 0.2 0.0 - 1.0 mg/dL   Nitrite NEGATIVE NEGATIVE   Leukocytes, UA NEGATIVE NEGATIVE  Urine microscopic-add on     Status: Abnormal   Collection Time: 12/23/14 11:50 AM  Result Value Ref Range   Squamous Epithelial / LPF FEW (A) RARE   WBC, UA 0-2 <3  WBC/hpf   RBC / HPF 3-6 <3 RBC/hpf   Bacteria, UA FEW (A) RARE   Urine-Other AMORPHOUS URATES/PHOSPHATES   Pregnancy, urine POC     Status: Abnormal   Collection Time: 12/23/14 12:02 PM  Result Value Ref Range   Preg Test, Ur POSITIVE (A) NEGATIVE  Rapid HIV screen     Status: None   Collection Time: 12/23/14  1:48 PM  Result Value Ref Range   SUDS Rapid HIV Screen NON REACTIVE NON REACTIVE   CLINICAL DATA: Vaginal bleeding in first trimester pregnancy. Gestational age by LMP of 11 weeks 3 days  EXAM: OBSTETRIC <14 WK [place] AND TRANSVAGINAL OB US  TECHNIQUE: Both transabdominal and transvaginal ultrasound examinations were performed for complete evaluation of the gestation as well as the maternal uterus, adnexal regions, and pelvic cul-de-sac. Transvaginal technique was performed to assess early pregnancy.  COMPARISON: None.  FINDINGS: Intrauterine gestational sac: Visualized/normal in shape.  Yolk sac: Not visualized  Embryo: Visualized  Cardiac Activity: Absent  CRL: 12 mm 7 w 4 d [place] EDC: 08/07/2015  Maternal uterus/adnexae: Both ovaries are normal in appearance. No mass or free fluid identified.  IMPRESSION: Findings meet definitive criteria for failed pregnancy. This follows SRU consensus guidelines: Diagnostic Criteria for Nonviable Pregnancy Early in the First Trimester. [facility] Mis J Med [phone].   Electronically Signed  By: Myles Rosenthal M.D.  On: 12/23/2014 15:07  MAU Course  Procedures  MDM Ordered GC/Chlamydia, HIV, wet prep, and <14wk OB US complete. US showed embryo, but absent fetal cardiac activity and no yolk sac. Impression per radiology was failed pregnancy. Discussed results with patient and the following management options: expectant management, cytotec, and D&C. Patient chose expectant management.   A positive blood type    Assessment and Plan  Assessment: Incomplete SAB  Plan 1. Discharge  home 2. Expectant management per patient decision.  3. Refer to GYN clinic for follow up in 1-2 weeks; referral sent  4. Return to MAU if abdominal cramping worsens or significant bleeding occurs (>2 pads/hr) 5. RX: Percocet, Phenergan  6. Support offered    Deborha Payment PA-S 12/23/2014, 3:27 PM   Evaluation and management procedures were performed by the PA student under my supervision and collaboration. I have reviewed the note and chart, and I agree with the management and plan.  Iona Hansen Rasch, NP 12/23/2014 4:23 PM

## 2014-12-23 NOTE — Discharge Instructions (Signed)
Miscarriage °A miscarriage is the loss of an unborn baby (fetus) before the 20th week of pregnancy. The cause is often unknown.  °HOME CARE °· You may need to stay in bed (bed rest), or you may be able to do light activity. Go about activity as told by your doctor. °· Have help at home. °· Write down how many pads you use each day. Write down how soaked they are. °· Do not use tampons. Do not wash out your vagina (douche) or have sex (intercourse) until your doctor approves. °· Only take medicine as told by your doctor. °· Do not take aspirin. °· Keep all doctor visits as told. °· If you or your partner have problems with grieving, talk to your doctor. You can also try counseling. Give yourself time to grieve before trying to get pregnant again. °GET HELP RIGHT AWAY IF: °· You have bad cramps or pain in your back or belly (abdomen). °· You have a fever. °· You pass large clumps of blood (clots) from your vagina that are walnut-sized or larger. Save the clumps for your doctor to see. °· You pass large amounts of tissue from your vagina. Save the tissue for your doctor to see. °· You have more bleeding. °· You have thick, bad-smelling fluid (discharge) coming from the vagina. °· You get lightheaded, weak, or you pass out (faint). °· You have chills. °MAKE SURE YOU: °· Understand these instructions. °· Will watch your condition. °· Will get help right away if you are not doing well or get worse. °Document Released: 02/12/2012 Document Reviewed: 02/12/2012 °ExitCare® Patient Information ©2015 [employer]. This information is not intended to replace advice given to you by your health care provider. Make sure you discuss any questions you have with your health care provider. ° °

## 2014-12-24 ENCOUNTER — Inpatient Hospital Stay (HOSPITAL_COMMUNITY)
Admission: AD | Admit: 2014-12-24 | Discharge: 2014-12-24 | Disposition: A | Discharge status: Home / Self Care | Payer: Self-pay | Source: Ambulatory Visit | Attending: Obstetrics & Gynecology | Admitting: Obstetrics & Gynecology

## 2014-12-24 DIAGNOSIS — O039 Complete or unspecified spontaneous abortion without complication: Secondary | ICD-10-CM | POA: Insufficient documentation

## 2014-12-24 DIAGNOSIS — Z3A00 Weeks of gestation of pregnancy not specified: Secondary | ICD-10-CM | POA: Insufficient documentation

## 2014-12-24 DIAGNOSIS — F17210 Nicotine dependence, cigarettes, uncomplicated: Secondary | ICD-10-CM | POA: Insufficient documentation

## 2014-12-24 LAB — CBC
HCT: 34.9 % — ABNORMAL LOW (ref 36.0–46.0)
Hemoglobin: 12.4 g/dL (ref 12.0–15.0)
MCH: 29.7 pg (ref 26.0–34.0)
MCHC: 35.5 g/dL (ref 30.0–36.0)
MCV: 83.7 fL (ref 78.0–100.0)
PLATELETS: 246 10*3/uL (ref 150–400)
RBC: 4.17 MIL/uL (ref 3.87–5.11)
RDW: 12.1 % (ref 11.5–15.5)
WBC: 6.8 10*3/uL (ref 4.0–10.5)

## 2014-12-24 LAB — GC/CHLAMYDIA PROBE AMP (~~LOC~~) NOT AT ARMC
CHLAMYDIA, DNA PROBE: NEGATIVE
NEISSERIA GONORRHEA: NEGATIVE

## 2014-12-24 LAB — HCG, QUANTITATIVE, PREGNANCY: hCG, Beta Chain, Quant, S: 4418 m[IU]/mL — ABNORMAL HIGH (ref <5)

## 2014-12-24 NOTE — MAU Provider Note (Signed)
History     CSN: [id number]  Arrival date and time: 12/24/14 1755   First Provider Initiated Contact with Patient 12/24/14 1832      Chief Complaint  Patient presents with  . Vaginal Bleeding  . Abdominal Pain   HPI  Gweneth DimitriCharmya E Hoaglin is a 24 y.o. G2P0100 who presents today with vaginal bleeding. She was seen yesterday and dx with a missed AB. Patient was in denial at the time, and wanted to go home and "see what happened". She was holding out hope that she would be able to continue this pregnancy. This morning she states that she started to have bleeding like a period. She has not passed any clots or tissue at this time. She has had some cramping as well.   Past Medical History  Diagnosis Date  . Asthma     Past Surgical History  Procedure Laterality Date  . Cesarean section    . Tonsillectomy      No family history on file.  History  Substance Use Topics  . Smoking status: Current Every Day Smoker -- 0.15 packs/day  . Smokeless tobacco: Not on file  . Alcohol Use: No    Allergies: No Known Allergies  Prescriptions prior to admission  Medication Sig Dispense Refill Last Dose  . Prenatal Vit-Fe Fumarate-FA (PRENATAL MULTIVITAMIN) TABS tablet Take 1 tablet by mouth daily at 12 noon.   12/24/2014 at Unknown time  . oxyCODONE-acetaminophen (PERCOCET/ROXICET) 5-325 MG per tablet Take 1-2 tablets by mouth every 4 (four) hours as needed for severe pain. (Patient not taking: Reported on 12/24/2014) 6 tablet 0 Not Taking at Unknown time  . promethazine (PHENERGAN) 25 MG tablet Take 0.5 tablets (12.5 mg total) by mouth every 6 (six) hours as needed for nausea or vomiting. (Patient not taking: Reported on 12/24/2014) 10 tablet 0 Not Taking at Unknown time    ROS Physical Exam   Blood pressure 120/81, pulse 80, temperature 99.6 F (37.6 C), temperature source Oral, resp. rate 16, last menstrual period 09/03/2014, unknown if currently breastfeeding.  Physical Exam  Nursing  note and vitals reviewed. Constitutional: She is oriented to person, place, and time. She appears well-developed and well-nourished. No distress.  Cardiovascular: Normal rate.   Respiratory: Effort normal.  GI: Soft. There is no tenderness. There is no rebound.  Genitourinary:   Moderate amount of BRB on pad.   Neurological: She is alert and oriented to person, place, and time.  Skin: Skin is warm and dry.  Psychiatric: She has a normal mood and affect.    MAU Course  Procedures  Results for orders placed or performed during the hospital encounter of 12/24/14 (from the past 24 hour(s))  CBC     Status: Abnormal   Collection Time: 12/24/14  6:30 PM  Result Value Ref Range   WBC 6.8 4.0 - 10.5 K/uL   RBC 4.17 3.87 - 5.11 MIL/uL   Hemoglobin 12.4 12.0 - 15.0 g/dL   HCT 62.134.9 (L) 30.836.0 - 65.746.0 %   MCV 83.7 78.0 - 100.0 fL   MCH 29.7 26.0 - 34.0 pg   MCHC 35.5 30.0 - 36.0 g/dL   RDW 84.612.1 96.211.5 - 95.215.5 %   Platelets 246 150 - 400 K/uL     Assessment and Plan   1. SAB (spontaneous abortion)     D/W patient at length about the bleeding patterns with a SAB, and what to expect.  Reviewed bleeding danger signs  Comfort measures reviewed  Normal grieving  patterns reviewed Encouraged patient to contact heartstrings support group   Tawnya Crook 12/24/2014, 6:48 PM

## 2014-12-24 NOTE — MAU Note (Signed)
Pt  Has had abdominal cramping and bleeding that has increased today x2 day, pt was seen here yesterday and was told that there was" no heart beat  her baby stopped growning at 7wks"

## 2014-12-24 NOTE — Discharge Instructions (Signed)
Miscarriage A miscarriage is the sudden loss of an unborn baby (fetus) before the 20th week of pregnancy. Most miscarriages happen in the first 3 months of pregnancy. Sometimes, it happens before a woman even knows she is pregnant. A miscarriage is also called a "spontaneous miscarriage" or "early pregnancy loss." Having a miscarriage can be an emotional experience. Talk with your caregiver about any questions you may have about miscarrying, the grieving process, and your future pregnancy plans. CAUSES   Problems with the fetal chromosomes that make it impossible for the baby to develop normally. Problems with the baby's genes or chromosomes are most often the result of errors that occur, by chance, as the embryo divides and grows. The problems are not inherited from the parents.  Infection of the cervix or uterus.   Hormone problems.   Problems with the cervix, such as having an incompetent cervix. This is when the tissue in the cervix is not strong enough to hold the pregnancy.   Problems with the uterus, such as an abnormally shaped uterus, uterine fibroids, or congenital abnormalities.   Certain medical conditions.   Smoking, drinking alcohol, or taking illegal drugs.   Trauma.  Often, the cause of a miscarriage is unknown.  SYMPTOMS   Vaginal bleeding or spotting, with or without cramps or pain.  Pain or cramping in the abdomen or lower back.  Passing fluid, tissue, or blood clots from the vagina. DIAGNOSIS  Your caregiver will perform a physical exam. You may also have an ultrasound to confirm the miscarriage. Blood or urine tests may also be ordered. TREATMENT   Sometimes, treatment is not necessary if you naturally pass all the fetal tissue that was in the uterus. If some of the fetus or placenta remains in the body (incomplete miscarriage), tissue left behind may become infected and must be removed. Usually, a dilation and curettage (D and C) procedure is performed.  During a D and C procedure, the cervix is widened (dilated) and any remaining fetal or placental tissue is gently removed from the uterus.  Antibiotic medicines are prescribed if there is an infection. Other medicines may be given to reduce the size of the uterus (contract) if there is a lot of bleeding.  If you have Rh negative blood and your baby was Rh positive, you will need a Rh immunoglobulin shot. This shot will protect any future baby from having Rh blood problems in future pregnancies. HOME CARE INSTRUCTIONS   Your caregiver may order bed rest or may allow you to continue light activity. Resume activity as directed by your caregiver.  Have someone help with home and family responsibilities during this time.   Keep track of the number of sanitary pads you use each day and how soaked (saturated) they are. Write down this information.   Do not use tampons. Do not douche or have sexual intercourse until approved by your caregiver.   Only take over-the-counter or prescription medicines for pain or discomfort as directed by your caregiver.   Do not take aspirin. Aspirin can cause bleeding.   Keep all follow-up appointments with your caregiver.   If you or your partner have problems with grieving, talk to your caregiver or seek counseling to help cope with the pregnancy loss. Allow enough time to grieve before trying to get pregnant again.  SEEK IMMEDIATE MEDICAL CARE IF:   You have severe cramps or pain in your back or abdomen.  You have a fever.  You pass large blood clots (walnut-sized   or larger) ortissue from your vagina. Save any tissue for your caregiver to inspect.   Your bleeding increases.   You have a thick, bad-smelling vaginal discharge.  You become lightheaded, weak, or you faint.   You have chills.  MAKE SURE YOU:  Understand these instructions.  Will watch your condition.  Will get help right away if you are not doing well or get  worse. Document Released: 05/16/2001 Document Revised: 03/17/2013 Document Reviewed: 01/09/2012 ExitCare Patient Information 2015 [employer]. This information is not intended to replace advice given to you by your health care provider. Make sure you discuss any questions you have with your health care provider.  

## 2015-01-04 ENCOUNTER — Ambulatory Visit (INDEPENDENT_AMBULATORY_CARE_PROVIDER_SITE_OTHER): Payer: Medicaid Other | Admitting: Family Medicine

## 2015-01-04 ENCOUNTER — Encounter: Payer: Self-pay | Admitting: Family Medicine

## 2015-01-04 VITALS — BP 132/88 | HR 72 | Ht 66.0 in | Wt 165.4 lb

## 2015-01-04 DIAGNOSIS — O039 Complete or unspecified spontaneous abortion without complication: Secondary | ICD-10-CM

## 2015-01-04 NOTE — Progress Notes (Signed)
   Subjective:    Patient ID: Stacy Christian, female    DOB: 03/06/1991, 24 y.o.   MRN: [id number]  HPI Follow up from MAU for failed pregnancy.  Desired expectant management.  Passed clots and tissue about 10 days ago.  Stopped bleeding.   Review of Systems  Constitutional: Negative for fever, chills and fatigue.  Gastrointestinal: Negative for nausea, vomiting, abdominal pain, diarrhea and constipation.       Objective:   Physical Exam  Constitutional: She is oriented to person, place, and time. She appears well-developed and well-nourished.  Abdominal: Soft. Bowel sounds are normal. She exhibits no distension and no mass. There is no tenderness. There is no rebound and no guarding.  Genitourinary: No labial fusion. There is no rash, tenderness, lesion or injury on the right labia. There is no rash, tenderness, lesion or injury on the left labia. Cervix exhibits no motion tenderness, no discharge and no friability. Right adnexum displays no mass, no tenderness and no fullness. Left adnexum displays no mass, no tenderness and no fullness. No erythema, tenderness or bleeding in the vagina. No foreign body around the vagina. No signs of injury around the vagina. No vaginal discharge found.  Neurological: She is alert and oriented to person, place, and time.  Skin: Skin is warm and dry.  Psychiatric: She has a normal mood and affect. Her behavior is normal. Judgment and thought content normal.      Assessment & Plan:   Problem List Items Addressed This Visit    None    Visit Diagnoses    SAB (spontaneous abortion)    -  Primary    Relevant Orders    hCG, quantitative, pregnancy      Will get bHCG.  If decreasing, no need for followup.

## 2015-01-05 LAB — HCG, QUANTITATIVE, PREGNANCY: hCG, Beta Chain, Quant, S: 215.6 m[IU]/mL

## 2015-01-06 ENCOUNTER — Telehealth: Payer: Self-pay

## 2015-01-06 NOTE — Telephone Encounter (Signed)
-----   Message from Levie HeritageJacob J Stinson, DO sent at 01/06/2015  1:20 PM EST ----- Has had substantial drop.  Only needs to follow up if hasn't had period in 2 months.

## 2015-01-06 NOTE — Telephone Encounter (Signed)
Attempted to contact patient. No answer. Left message stating we are calling with results, please call clinic.  

## 2015-01-07 ENCOUNTER — Ambulatory Visit: Payer: Self-pay | Admitting: Obstetrics and Gynecology

## 2015-01-07 NOTE — Telephone Encounter (Signed)
Called patient and informed her of results and recommendations. Patient asked if she could be intimate with her boyfriend. Informed patient it is recommended that she use protection to prevent pregnancy for a couple of months to allow for her body to heal. Patient verbalized understanding and gratitude. No further questions or concerns.

## 2015-02-05 ENCOUNTER — Telehealth: Payer: Self-pay

## 2015-02-05 NOTE — Telephone Encounter (Signed)
Patient's mother came in today to schedule appointment for her daughter; patient was scheduled for 4:15 for next Friday with Vikki PortsValerie however needs to be rescheduled; at the time I was unaware but patient was scheduled, the limit for new patients had already been reached; please let the patient know I apologize and that they can make the appointment any morning at 9am;

## 2015-02-12 ENCOUNTER — Ambulatory Visit: Payer: Self-pay | Admitting: Internal Medicine

## 2015-05-20 ENCOUNTER — Inpatient Hospital Stay (HOSPITAL_COMMUNITY)
Admission: AD | Admit: 2015-05-20 | Discharge: 2015-05-20 | Disposition: A | Discharge status: Home / Self Care | Payer: Self-pay | Source: Ambulatory Visit | Attending: Obstetrics & Gynecology | Admitting: Obstetrics & Gynecology

## 2015-05-20 ENCOUNTER — Encounter (HOSPITAL_COMMUNITY): Payer: Self-pay | Admitting: *Deleted

## 2015-05-20 DIAGNOSIS — R519 Headache, unspecified: Secondary | ICD-10-CM

## 2015-05-20 DIAGNOSIS — N760 Acute vaginitis: Secondary | ICD-10-CM | POA: Insufficient documentation

## 2015-05-20 DIAGNOSIS — B9689 Other specified bacterial agents as the cause of diseases classified elsewhere: Secondary | ICD-10-CM

## 2015-05-20 DIAGNOSIS — R51 Headache: Secondary | ICD-10-CM | POA: Insufficient documentation

## 2015-05-20 LAB — URINALYSIS, ROUTINE W REFLEX MICROSCOPIC
GLUCOSE, UA: NEGATIVE mg/dL
Ketones, ur: 15 mg/dL — AB
Leukocytes, UA: NEGATIVE
Nitrite: NEGATIVE
PH: 7 (ref 5.0–8.0)
Protein, ur: NEGATIVE mg/dL
SPECIFIC GRAVITY, URINE: 1.015 (ref 1.005–1.030)
Urobilinogen, UA: 1 mg/dL (ref 0.0–1.0)

## 2015-05-20 LAB — URINE MICROSCOPIC-ADD ON

## 2015-05-20 LAB — WET PREP, GENITAL
TRICH WET PREP: NONE SEEN
Yeast Wet Prep HPF POC: NONE SEEN

## 2015-05-20 LAB — POCT PREGNANCY, URINE: PREG TEST UR: NEGATIVE

## 2015-05-20 MED ORDER — BUTALBITAL-APAP-CAFFEINE 50-325-40 MG PO TABS
1.0000 | ORAL_TABLET | Freq: Once | ORAL | Status: AC
  Administered 2015-05-20: 1 via ORAL
  Filled 2015-05-20: qty 1

## 2015-05-20 MED ORDER — METRONIDAZOLE 500 MG PO TABS: 500.0000 mg | ORAL_TABLET | Freq: Two times a day (BID) | ORAL | Status: DC
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
## 2015-05-20 NOTE — MAU Note (Signed)
Pt reports having a headache since yesterday. Had taken several thing for it tylenol, Excedrin and goody powder (not all at once). Worried her b/p migh be elevated. Also c/o spotting yesterday. Not due for her period.

## 2015-05-20 NOTE — Discharge Instructions (Signed)
Acetaminophen; Aspirin, ASA; Caffeine oral powder What is this medicine? ACETAMINOPHEN; ASPIRIN; CAFFEINE (a set a MEE noe fen; AS pir in; KAF een) is a pain reliever. It is used to treat mild aches and pains. This medicine may help with arthritis, colds, headache (including migraine), muscle aches, menstrual cramps, sinusitis, and toothache. This medicine may be used for other purposes; ask your health care provider or pharmacist if you have questions. COMMON BRAND NAME(S): Goody's Cool [employer], Goody's Extra Strength Headache What should I tell my health care provider before I take this medicine? They need to know if you have any of these conditions: -anemia -anxiety or panic attacks -asthma -bleeding problems -child with chickenpox, the flu, or other viral infection -diabetes -gout -heart disease -high blood pressure -if you often drink alcohol -kidney disease -liver disease -low level of vitamin K -lupus -smoke tobacco -stomach ulcers or other problems -trouble sleeping -an unusual or allergic reaction to acetaminophen, aspirin, caffeine, other medicines, foods, dyes, or preservatives -pregnant or trying to get pregnant -breast-feeding How should I use this medicine? Take this medicine by mouth. Place one packet of powder on tongue and follow with plenty of liquid, or stir powder into a glass of water or other liquid. Follow the directions on the package or prescription label. You can take this medicine with or without food. If it upsets your stomach, take it with food. Take your medicine at regular intervals. Do not take your medicine more often than directed. Talk to your pediatrician regarding the use of this medicine in children. Special care may be needed. Patients over 103 years old may have a stronger reaction and need a smaller dose. Overdosage: If you think you have taken too much of this medicine contact a poison control center or emergency room at  once. NOTE: This medicine is only for you. Do not share this medicine with others. What if I miss a dose? If you miss a dose, take it as soon as you can. If it is almost time for your next dose, take only that dose. Do not take double or extra doses. What may interact with this medicine? Do not take this medicine with any of the following medications: -MAOIs like Carbex, Eldepryl, Marplan, Nardil, and Parnate -methotrexate -other medicines with acetaminophen -probenecid This medicine may also interact with the following medications: -alcohol -alendronate -bismuth subsalicylate -clozapine -flavocoxid -grapefruit juice -herbal supplements like feverfew, garlic, ginger, ginkgo biloba, horse chestnut -isoniazid -lithium -medicines for diabetes or glaucoma like acetazolamide, methazolamide -medicines for gout -medicines that stimulate or keep you awake -medicines that treat or prevent blood clots like enoxaparin, heparin, ticlopidine, warfarin -NSAIDs, medicines for pain and inflammation, like ibuprofen or naproxen -other aspirin and aspirin-like medicines -some cough and cold medicines like pseudoephedrine -varicella live vaccine This list may not describe all possible interactions. Give your health care provider a list of all the medicines, herbs, non-prescription drugs, or dietary supplements you use. Also tell them if you smoke, drink alcohol, or use illegal drugs. Some items may interact with your medicine. What should I watch for while using this medicine? Tell your doctor or health care professional if the pain lasts more than 10 days, if it gets worse, or if there is a new or different kind of pain. Tell your doctor if you see redness or swelling. If you are treating a fever, check with your doctor if the fever that lasts for more than 3 days. Do not take Tylenol (acetaminophen) or medicines that  have acetaminophen with this medicine. Too much acetaminophen can be very dangerous.  Always read medicine labels carefully. Report any possible overdose to your doctor or health care professional right away, even if there are no symptoms. The effects of extra doses may not be seen for many days. This medicine can irritate your stomach or cause bleeding problems. Do not smoke cigarettes or drink alcohol. Do not lie down for 30 minutes after taking this medicine to prevent irritation to your throat. If you are scheduled for any medical or dental procedure, tell your healthcare provider that you are taking this medicine. You may need to stop taking this medicine before the procedure. Do not take this medicine close to bedtime. It may prevent you from sleeping. This medicine may be used to treat migraines. If you take migraine medicines for 10 or more days a month, your migraines may get worse. Keep a diary of headache days and medicine use. Contact your healthcare professional if your migraine attacks occur more frequently. What side effects may I notice from receiving this medicine? Side effects that you should report to your doctor or health care professional as soon as possible: -allergic reactions like skin rash, itching or hives, swelling of the face, lips, or tongue -breathing problems -fast, irregular heartbeat -feeling faint or lightheaded, falls -fever or sore throat -pain on swallowing -ringing in the ears or trouble hearing -signs and symptoms of bleeding such as bloody or black, tarry stools; red or dark-brown urine; spitting up blood or brown material that looks like coffee grounds; red spots on the skin; unusual bruising or bleeding from the eye, gums, or nose -trouble passing urine or change in the amount of urine -unusually weak or tired -yellowing of the eyes or skin Side effects that usually do not require medical attention (report to your doctor or health care professional if they continue or are bothersome): -diarrhea -headache -nausea, vomiting -passing urine  more often -trouble sleeping This list may not describe all possible side effects. Call your doctor for medical advice about side effects. You may report side effects to FDA at 1-800-FDA-1088. Where should I keep my medicine? Keep out of the reach of children. Store at room temperature between 15 and 30 degrees C (59 and 86 degrees F). Keep container tightly closed. Throw away any unused medicine after the expiration date. NOTE: This sheet is a summary. It may not cover all possible information. If you have questions about this medicine, talk to your doctor, pharmacist, or health care provider.  2015, Elsevier/Gold Standard. (2013-07-22 11:02:11) Bacterial Vaginosis Bacterial vaginosis is an infection of the vagina. It happens when too many of certain germs (bacteria) grow in the vagina. HOME CARE  Take your medicine as told by your doctor.  Finish your medicine even if you start to feel better.  Do not have sex until you finish your medicine and are better.  Tell your sex partner that you have an infection. They should see their doctor for treatment.  Practice safe sex. Use condoms. Have only one sex partner. GET HELP IF:  You are not getting better after 3 days of treatment.  You have more grey fluid (discharge) coming from your vagina than before.  You have more pain than before.  You have a fever. MAKE SURE YOU:   Understand these instructions.  Will watch your condition.  Will get help right away if you are not doing well or get worse. Document Released: 08/29/2008 Document Revised: 09/10/2013 Document Reviewed: 07/02/2013  ExitCare® Patient Information ©2015 [employer]. This information is not intended to replace advice given to you by your health care provider. Make sure you discuss any questions you have with your health care provider. ° °

## 2015-05-21 LAB — GC/CHLAMYDIA PROBE AMP (~~LOC~~) NOT AT ARMC
CHLAMYDIA, DNA PROBE: NEGATIVE
Neisseria Gonorrhea: NEGATIVE

## 2015-05-21 LAB — HIV ANTIBODY (ROUTINE TESTING W REFLEX): HIV SCREEN 4TH GENERATION: NONREACTIVE

## 2015-05-21 LAB — RPR: RPR Ser Ql: NONREACTIVE

## 2016-09-27 ENCOUNTER — Emergency Department (HOSPITAL_COMMUNITY): Payer: No Typology Code available for payment source

## 2016-09-27 ENCOUNTER — Emergency Department (HOSPITAL_COMMUNITY)
Admission: EM | Admit: 2016-09-27 | Discharge: 2016-09-27 | Disposition: A | Discharge status: Home / Self Care | Payer: No Typology Code available for payment source | Attending: Emergency Medicine | Admitting: Emergency Medicine

## 2016-09-27 ENCOUNTER — Encounter (HOSPITAL_COMMUNITY): Payer: Self-pay

## 2016-09-27 DIAGNOSIS — S39012A Strain of muscle, fascia and tendon of lower back, initial encounter: Secondary | ICD-10-CM | POA: Insufficient documentation

## 2016-09-27 DIAGNOSIS — F17200 Nicotine dependence, unspecified, uncomplicated: Secondary | ICD-10-CM | POA: Insufficient documentation

## 2016-09-27 DIAGNOSIS — J45909 Unspecified asthma, uncomplicated: Secondary | ICD-10-CM | POA: Diagnosis not present

## 2016-09-27 DIAGNOSIS — Y92410 Unspecified street and highway as the place of occurrence of the external cause: Secondary | ICD-10-CM | POA: Insufficient documentation

## 2016-09-27 DIAGNOSIS — S46811A Strain of other muscles, fascia and tendons at shoulder and upper arm level, right arm, initial encounter: Secondary | ICD-10-CM | POA: Insufficient documentation

## 2016-09-27 DIAGNOSIS — Y939 Activity, unspecified: Secondary | ICD-10-CM | POA: Diagnosis not present

## 2016-09-27 DIAGNOSIS — Y999 Unspecified external cause status: Secondary | ICD-10-CM | POA: Diagnosis not present

## 2016-09-27 DIAGNOSIS — S3992XA Unspecified injury of lower back, initial encounter: Secondary | ICD-10-CM | POA: Diagnosis present

## 2016-09-27 LAB — POC URINE PREG, ED: Preg Test, Ur: NEGATIVE

## 2016-09-27 MED ORDER — NAPROXEN 500 MG PO TABS: 500.0000 mg | ORAL_TABLET | Freq: Two times a day (BID) | ORAL | 0 refills | Status: DC

## 2016-09-27 MED ORDER — METHOCARBAMOL 500 MG PO TABS: 500.0000 mg | ORAL_TABLET | Freq: Two times a day (BID) | ORAL | 0 refills | Status: DC

## 2016-09-27 NOTE — ED Notes (Signed)
Pt is in stable condition upon d/c and ambulates from ED. 

## 2016-09-27 NOTE — ED Notes (Signed)
Patient transported to X-ray 

## 2016-09-27 NOTE — Discharge Instructions (Signed)
Naprosyn for pain and inflammation. Robaxin for spasms. Try ice and heat. Follow up with family doctor as needed.
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
## 2017-07-09 ENCOUNTER — Inpatient Hospital Stay (HOSPITAL_COMMUNITY)
Admission: AD | Admit: 2017-07-09 | Discharge: 2017-07-09 | Disposition: A | Discharge status: Home / Self Care | Payer: Medicaid Other | Source: Ambulatory Visit | Attending: Obstetrics & Gynecology | Admitting: Obstetrics & Gynecology

## 2017-07-09 ENCOUNTER — Inpatient Hospital Stay (HOSPITAL_COMMUNITY): Payer: Medicaid Other

## 2017-07-09 ENCOUNTER — Encounter (HOSPITAL_COMMUNITY): Payer: Self-pay | Admitting: Advanced Practice Midwife

## 2017-07-09 DIAGNOSIS — R1030 Lower abdominal pain, unspecified: Secondary | ICD-10-CM | POA: Insufficient documentation

## 2017-07-09 DIAGNOSIS — O23591 Infection of other part of genital tract in pregnancy, first trimester: Secondary | ICD-10-CM | POA: Diagnosis not present

## 2017-07-09 DIAGNOSIS — F17210 Nicotine dependence, cigarettes, uncomplicated: Secondary | ICD-10-CM | POA: Insufficient documentation

## 2017-07-09 DIAGNOSIS — O99331 Smoking (tobacco) complicating pregnancy, first trimester: Secondary | ICD-10-CM | POA: Diagnosis not present

## 2017-07-09 DIAGNOSIS — R102 Pelvic and perineal pain: Secondary | ICD-10-CM

## 2017-07-09 DIAGNOSIS — Z3A12 12 weeks gestation of pregnancy: Secondary | ICD-10-CM | POA: Diagnosis not present

## 2017-07-09 DIAGNOSIS — N760 Acute vaginitis: Secondary | ICD-10-CM

## 2017-07-09 DIAGNOSIS — O26891 Other specified pregnancy related conditions, first trimester: Secondary | ICD-10-CM

## 2017-07-09 DIAGNOSIS — B9689 Other specified bacterial agents as the cause of diseases classified elsewhere: Secondary | ICD-10-CM | POA: Diagnosis not present

## 2017-07-09 LAB — CBC
HEMATOCRIT: 45.4 % (ref 36.0–46.0)
HEMOGLOBIN: 16.2 g/dL — AB (ref 12.0–15.0)
MCH: 30.6 pg (ref 26.0–34.0)
MCHC: 35.7 g/dL (ref 30.0–36.0)
MCV: 85.8 fL (ref 78.0–100.0)
Platelets: 280 10*3/uL (ref 150–400)
RBC: 5.29 MIL/uL — ABNORMAL HIGH (ref 3.87–5.11)
RDW: 12.8 % (ref 11.5–15.5)
WBC: 8 10*3/uL (ref 4.0–10.5)

## 2017-07-09 LAB — URINALYSIS, ROUTINE W REFLEX MICROSCOPIC
BILIRUBIN URINE: NEGATIVE
Glucose, UA: NEGATIVE mg/dL
Hgb urine dipstick: NEGATIVE
Ketones, ur: 20 mg/dL — AB
Leukocytes, UA: NEGATIVE
Nitrite: NEGATIVE
Protein, ur: NEGATIVE mg/dL
Specific Gravity, Urine: 1.008 (ref 1.005–1.030)
pH: 6 (ref 5.0–8.0)

## 2017-07-09 LAB — WET PREP, GENITAL
Sperm: NONE SEEN
Trich, Wet Prep: NONE SEEN
YEAST WET PREP: NONE SEEN

## 2017-07-09 LAB — POCT PREGNANCY, URINE: PREG TEST UR: POSITIVE — AB

## 2017-07-09 LAB — HCG, QUANTITATIVE, PREGNANCY: hCG, Beta Chain, Quant, S: 12895 m[IU]/mL — ABNORMAL HIGH (ref <5)

## 2017-07-09 MED ORDER — METRONIDAZOLE 500 MG PO TABS: 500.0000 mg | ORAL_TABLET | Freq: Two times a day (BID) | ORAL | 0 refills | Status: AC
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
## 2017-07-09 NOTE — Discharge Instructions (Signed)

## 2017-07-09 NOTE — MAU Provider Note (Signed)
Chief Complaint: Abdominal Pain   First Provider Initiated Contact with Patient 07/09/17 1825        SUBJECTIVE HPI: Stacy Christian is a 26 y.o. G3P0110 at [redacted]w[redacted]d by LMP who presents to maternity admissions reporting lower abdominal cramping.  No bleeding. Had a + HPT last week. . She denies vaginal bleeding, vaginal itching/burning, urinary symptoms, h/a, dizziness, n/v, or fever/chills.    Abdominal Pain  This is a new problem. The current episode started yesterday. The onset quality is gradual. The problem occurs intermittently. The problem has been unchanged. The pain is located in the suprapubic region. The quality of the pain is cramping. The abdominal pain does not radiate. Pertinent negatives include no constipation, diarrhea, fever, frequency, myalgias or nausea. Nothing aggravates the pain. The pain is relieved by nothing. She has tried nothing for the symptoms.   RN Note: Pt C/O lower abd cramping, denies bleeding.  Pos HPT last Thursday.  Hx of two prior losses.  Past Medical History:  Diagnosis Date  . Asthma    Past Surgical History:  Procedure Laterality Date  . CESAREAN SECTION    . TONSILLECTOMY     Social History   Social History  . Marital status: Single    Spouse name: N/A  . Number of children: N/A  . Years of education: N/A   Occupational History  . Not on file.   Social History Main Topics  . Smoking status: Current Every Day Smoker    Packs/day: 0.15  . Smokeless tobacco: Never Used  . Alcohol use No  . Drug use: Yes    Frequency: 3.0 times per week    Types: Marijuana  . Sexual activity: Yes   Other Topics Concern  . Not on file   Social History Narrative  . No narrative on file   No current facility-administered medications on file prior to encounter.    Current Outpatient Prescriptions on File Prior to Encounter  Medication Sig Dispense Refill  . bismuth subsalicylate (PEPTO BISMOL) 262 MG/15ML suspension Take 30 mLs by mouth as  needed for indigestion.    . methocarbamol (ROBAXIN) 500 MG tablet Take 1 tablet (500 mg total) by mouth 2 (two) times daily. 20 tablet 0  . metroNIDAZOLE (FLAGYL) 500 MG tablet Take 1 tablet (500 mg total) by mouth 2 (two) times daily. 14 tablet 0  . naproxen (NAPROSYN) 500 MG tablet Take 1 tablet (500 mg total) by mouth 2 (two) times daily. 30 tablet 0  . Prenatal Vit-Fe Fumarate-FA (PRENATAL MULTIVITAMIN) TABS tablet Take 1 tablet by mouth daily at 12 noon.     No Known Allergies  I have reviewed patient's Past Medical Hx, Surgical Hx, Family Hx, Social Hx, medications and allergies.   ROS:  Review of Systems  Constitutional: Negative for fever.  Gastrointestinal: Positive for abdominal pain. Negative for constipation, diarrhea and nausea.  Genitourinary: Negative for frequency.  Musculoskeletal: Negative for myalgias.   Review of Systems  Other systems negative   Physical Exam  Physical Exam Patient Vitals for the past 24 hrs:  BP Temp Temp src Pulse Resp Height Weight  07/09/17 1529 135/84 99.4 F (37.4 C) Oral (!) 104 16 5\' 6"  (1.676 m) 139 lb (63 kg)   Constitutional: Well-developed, well-nourished female in no acute distress.  Cardiovascular: normal rate Respiratory: normal effort GI: Abd soft, non-tender. Pos BS x 4 MS: Extremities nontender, no edema, normal ROM Neurologic: Alert and oriented x 4.  GU: Neg CVAT.  PELVIC EXAM:  Cervix pink, visually closed, without lesion, scant white creamy discharge, vaginal walls and external genitalia normal Bimanual exam: Cervix 0/long/high, firm, anterior, neg CMT, uterus nontender, nonenlarged, adnexa without tenderness, enlargement, or mass   LAB RESULTS Results for orders placed or performed during the hospital encounter of 07/09/17 (from the past 24 hour(s))  Urinalysis, Routine w reflex microscopic     Status: Abnormal   Collection Time: 07/09/17  3:33 PM  Result Value Ref Range   Color, Urine YELLOW YELLOW    APPearance CLEAR CLEAR   Specific Gravity, Urine 1.008 1.005 - 1.030   pH 6.0 5.0 - 8.0   Glucose, UA NEGATIVE NEGATIVE mg/dL   Hgb urine dipstick NEGATIVE NEGATIVE   Bilirubin Urine NEGATIVE NEGATIVE   Ketones, ur 20 (A) NEGATIVE mg/dL   Protein, ur NEGATIVE NEGATIVE mg/dL   Nitrite NEGATIVE NEGATIVE   Leukocytes, UA NEGATIVE NEGATIVE  CBC     Status: Abnormal   Collection Time: 07/09/17  3:34 PM  Result Value Ref Range   WBC 8.0 4.0 - 10.5 K/uL   RBC 5.29 (H) 3.87 - 5.11 MIL/uL   Hemoglobin 16.2 (H) 12.0 - 15.0 g/dL   HCT 16.1 09.6 - 04.5 %   MCV 85.8 78.0 - 100.0 fL   MCH 30.6 26.0 - 34.0 pg   MCHC 35.7 30.0 - 36.0 g/dL   RDW 40.9 81.1 - 91.4 %   Platelets 280 150 - 400 K/uL  hCG, quantitative, pregnancy     Status: Abnormal   Collection Time: 07/09/17  3:34 PM  Result Value Ref Range   hCG, Beta Chain, Quant, S 12,895 (H) <5 mIU/mL  Pregnancy, urine POC     Status: Abnormal   Collection Time: 07/09/17  4:04 PM  Result Value Ref Range   Preg Test, Ur POSITIVE (A) NEGATIVE  Wet prep, genital     Status: Abnormal   Collection Time: 07/09/17  5:05 PM  Result Value Ref Range   Yeast Wet Prep HPF POC NONE SEEN NONE SEEN   Trich, Wet Prep NONE SEEN NONE SEEN   Clue Cells Wet Prep HPF POC PRESENT (A) NONE SEEN   WBC, Wet Prep HPF POC FEW (A) NONE SEEN   Sperm NONE SEEN        IMAGING US Ob Comp Less 14 Wks  Result Date: 07/09/2017 CLINICAL DATA:  26 year old pregnant female with lower abdominal cramping. Quantitative beta HCG 12,895. EDC by LMP: 01/19/2018, projecting to an expected gestational age of [redacted] weeks 2 days. EXAM: OBSTETRIC <14 WK [place] AND TRANSVAGINAL OB US TECHNIQUE: Both transabdominal and transvaginal ultrasound examinations were performed for complete evaluation of the gestation as well as the maternal uterus, adnexal regions, and pelvic cul-de-sac. Transvaginal technique was performed to assess early pregnancy. COMPARISON:  No prior scans from this gestation.  FINDINGS: Intrauterine gestational sac: Single intrauterine gestational sac appears normal in shape and position. Yolk sac:  Visualized. Embryo:  Not Visualized. Embryonic Cardiac Activity: Not Visualized. MSD: 10.1  mm   5 w   6  d Subchorionic hemorrhage:  None visualized. Maternal uterus/adnexae: Anteverted uterus. No uterine fibroids. Right ovary measures 3.5 x 1.9 x 2.7 cm. Left ovary measures 3.4 x 2.2 x 3.4 cm (seen only on transabdominal images). No abnormal ovarian or adnexal masses. No abnormal free fluid in the pelvis. IMPRESSION: 1. Single intrauterine gestational sac with yolk sac at 5 weeks 6 days by mean sac diameter. No embryo visualized at this time, which could be due to  early gestational age. Follow-up obstetric scan advised in 11-14 days. This recommendation follows SRU consensus guidelines: Diagnostic Criteria for Nonviable Pregnancy Early in the First Trimester. Malva Limes Engl J Med 2013; 409:8119-14; 369:1443-51. 2. No ovarian or adnexal abnormalities. Electronically Signed   By: Delbert PhenixJason A Poff M.D.   On: 07/09/2017 17:58   [place] Ob Transvaginal  Result Date: 07/09/2017 CLINICAL DATA:  26 year old pregnant female with lower abdominal cramping. Quantitative beta HCG 12,895. EDC by LMP: 01/19/2018, projecting to an expected gestational age of [redacted] weeks 2 days. EXAM: OBSTETRIC <14 WK US AND TRANSVAGINAL OB US TECHNIQUE: Both transabdominal and transvaginal ultrasound examinations were performed for complete evaluation of the gestation as well as the maternal uterus, adnexal regions, and pelvic cul-de-sac. Transvaginal technique was performed to assess early pregnancy. COMPARISON:  No prior scans from this gestation. FINDINGS: Intrauterine gestational sac: Single intrauterine gestational sac appears normal in shape and position. Yolk sac:  Visualized. Embryo:  Not Visualized. Embryonic Cardiac Activity: Not Visualized. MSD: 10.1  mm   5 w   6  d Subchorionic hemorrhage:  None visualized. Maternal uterus/adnexae:  Anteverted uterus. No uterine fibroids. Right ovary measures 3.5 x 1.9 x 2.7 cm. Left ovary measures 3.4 x 2.2 x 3.4 cm (seen only on transabdominal images). No abnormal ovarian or adnexal masses. No abnormal free fluid in the pelvis. IMPRESSION: 1. Single intrauterine gestational sac with yolk sac at 5 weeks 6 days by mean sac diameter. No embryo visualized at this time, which could be due to early gestational age. Follow-up obstetric scan advised in 11-14 days. This recommendation follows SRU consensus guidelines: Diagnostic Criteria for Nonviable Pregnancy Early in the First Trimester. Malva Limes Engl J Med 2013; 782:9562-13; 369:1443-51. 2. No ovarian or adnexal abnormalities. Electronically Signed   By: Delbert PhenixJason A Poff M.D.   On: 07/09/2017 17:58    MAU Management/MDM: Ordered usual first trimester r/o ectopic labs.   Pelvic exam and cultures done Will check baseline Ultrasound to rule out ectopic.  This bleeding/pain can represent a normal pregnancy with bleeding, spontaneous abortion or even an ectopic which can be life-threatening.  The process as listed above helps to determine which of these is present.  Reviewed results. Discussed yolk sac effectively rules out ectopic Will repeat US for viability in 10 days per recommendation Advised to seek [facility] soon.    ASSESSMENT 1. Pelvic pain affecting pregnancy in first trimester, antepartum   2. Pelvic pain affecting pregnancy in first trimester, antepartum   3.     Intrauterine pregnancy at 4275w6d 4.    Bacterial vaginosis  PLAN Discharge home Rx Flagyl for BV Will repeat  Ultrasound in about 7-10 days  Pt stable at time of discharge. Encouraged to return here or to other Urgent Care/ED if she develops worsening of symptoms, increase in pain, fever, or other concerning symptoms.    Wynelle BourgeoisMarie Williams CNM, MSN Certified Nurse-Midwife 07/09/2017  6:25 PM

## 2017-07-10 ENCOUNTER — Other Ambulatory Visit (HOSPITAL_COMMUNITY): Payer: Self-pay | Admitting: Advanced Practice Midwife

## 2017-07-10 LAB — GC/CHLAMYDIA PROBE AMP (~~LOC~~) NOT AT ARMC
Chlamydia: POSITIVE — AB
NEISSERIA GONORRHEA: NEGATIVE

## 2017-07-10 LAB — HIV ANTIBODY (ROUTINE TESTING W REFLEX): HIV Screen 4th Generation wRfx: NONREACTIVE

## 2017-07-10 MED ORDER — AZITHROMYCIN 500 MG PO TABS: 1000.0000 mg | ORAL_TABLET | Freq: Once | ORAL | 0 refills | Status: AC

## 2017-07-10 NOTE — Progress Notes (Signed)
Rx sent to pharmacy Azithromycin 1gm po once Aviva SignsWilliams, Maximilian Tallo L, CNM

## 2017-07-11 ENCOUNTER — Telehealth: Payer: Self-pay | Admitting: Student

## 2017-07-11 DIAGNOSIS — A749 Chlamydial infection, unspecified: Secondary | ICD-10-CM

## 2017-07-11 MED ORDER — AZITHROMYCIN 500 MG PO TABS: 1000.0000 mg | ORAL_TABLET | Freq: Once | ORAL | 0 refills | Status: AC

## 2017-07-11 NOTE — Telephone Encounter (Addendum)
Stacy Christian tested positive for  Chlamydia. Patient was called by RN and allergies and pharmacy confirmed. Rx sent to pharmacy of choice.   Judeth HornLawrence, Sullivan Jacuinde, NP 07/11/2017 9:13 AM           ----- Message from Kathe BectonLori S Berdik, RN sent at 07/10/2017  5:06 PM EDT ----- This patient tested positive for chlamydia.  She "has NKDA",  I have informed the patient of her results and confirmed her pharmacy is correct in her chart. Please send Rx.   Thank you,   Kathe BectonBerdik, Lori S, RN   [facility]

## 2017-07-18 ENCOUNTER — Ambulatory Visit: Payer: Medicaid Other

## 2017-07-18 ENCOUNTER — Ambulatory Visit (HOSPITAL_COMMUNITY)
Admission: RE | Admit: 2017-07-18 | Discharge: 2017-07-18 | Disposition: A | Discharge status: Home / Self Care | Payer: Medicaid Other | Source: Ambulatory Visit | Attending: Advanced Practice Midwife | Admitting: Advanced Practice Midwife

## 2017-07-18 DIAGNOSIS — Z3A01 Less than 8 weeks gestation of pregnancy: Secondary | ICD-10-CM | POA: Diagnosis not present

## 2017-07-18 DIAGNOSIS — R102 Pelvic and perineal pain: Secondary | ICD-10-CM | POA: Diagnosis not present

## 2017-07-18 DIAGNOSIS — O26891 Other specified pregnancy related conditions, first trimester: Secondary | ICD-10-CM | POA: Diagnosis present

## 2017-07-18 DIAGNOSIS — Z712 Person consulting for explanation of examination or test findings: Secondary | ICD-10-CM

## 2017-07-18 DIAGNOSIS — O283 Abnormal ultrasonic finding on antenatal screening of mother: Secondary | ICD-10-CM

## 2017-07-18 NOTE — Progress Notes (Signed)
Patient presented to the office for U/S results. Explained to patient that per her U/S she is 7w 4d with EDD 03/02/18. Patient states that she is taking one a day prenatal vitamins.Told patient that she should start prenatal care and to check with the front desk to see if we are accepting new patients. Patient verbalized understanding and had no questions.

## 2017-07-24 ENCOUNTER — Encounter: Payer: Self-pay | Admitting: Advanced Practice Midwife

## 2017-07-24 DIAGNOSIS — O98819 Other maternal infectious and parasitic diseases complicating pregnancy, unspecified trimester: Secondary | ICD-10-CM

## 2017-07-24 DIAGNOSIS — A749 Chlamydial infection, unspecified: Secondary | ICD-10-CM | POA: Insufficient documentation

## 2017-08-07 ENCOUNTER — Encounter: Payer: Self-pay | Admitting: Advanced Practice Midwife

## 2017-08-07 ENCOUNTER — Ambulatory Visit (INDEPENDENT_AMBULATORY_CARE_PROVIDER_SITE_OTHER): Payer: Medicaid Other | Admitting: Advanced Practice Midwife

## 2017-08-07 ENCOUNTER — Other Ambulatory Visit (HOSPITAL_COMMUNITY)
Admission: RE | Admit: 2017-08-07 | Discharge: 2017-08-07 | Disposition: A | Discharge status: Home / Self Care | Payer: Medicaid Other | Source: Ambulatory Visit | Attending: Advanced Practice Midwife | Admitting: Advanced Practice Midwife

## 2017-08-07 ENCOUNTER — Ambulatory Visit: Payer: Self-pay

## 2017-08-07 VITALS — BP 127/79 | HR 79 | Wt 148.6 lb

## 2017-08-07 DIAGNOSIS — O3680X0 Pregnancy with inconclusive fetal viability, not applicable or unspecified: Secondary | ICD-10-CM

## 2017-08-07 DIAGNOSIS — O0990 Supervision of high risk pregnancy, unspecified, unspecified trimester: Secondary | ICD-10-CM | POA: Insufficient documentation

## 2017-08-07 DIAGNOSIS — O09899 Supervision of other high risk pregnancies, unspecified trimester: Secondary | ICD-10-CM | POA: Insufficient documentation

## 2017-08-07 DIAGNOSIS — Z1379 Encounter for other screening for genetic and chromosomal anomalies: Secondary | ICD-10-CM

## 2017-08-07 DIAGNOSIS — O09211 Supervision of pregnancy with history of pre-term labor, first trimester: Secondary | ICD-10-CM

## 2017-08-07 DIAGNOSIS — O98811 Other maternal infectious and parasitic diseases complicating pregnancy, first trimester: Secondary | ICD-10-CM

## 2017-08-07 DIAGNOSIS — O34219 Maternal care for unspecified type scar from previous cesarean delivery: Secondary | ICD-10-CM

## 2017-08-07 DIAGNOSIS — O09219 Supervision of pregnancy with history of pre-term labor, unspecified trimester: Secondary | ICD-10-CM

## 2017-08-07 DIAGNOSIS — B373 Candidiasis of vulva and vagina: Secondary | ICD-10-CM

## 2017-08-07 DIAGNOSIS — A749 Chlamydial infection, unspecified: Secondary | ICD-10-CM

## 2017-08-07 DIAGNOSIS — B3731 Acute candidiasis of vulva and vagina: Secondary | ICD-10-CM

## 2017-08-07 DIAGNOSIS — Z98891 History of uterine scar from previous surgery: Secondary | ICD-10-CM | POA: Insufficient documentation

## 2017-08-07 DIAGNOSIS — O0991 Supervision of high risk pregnancy, unspecified, first trimester: Secondary | ICD-10-CM

## 2017-08-07 LAB — POCT URINALYSIS DIP (DEVICE)
BILIRUBIN URINE: NEGATIVE
Glucose, UA: NEGATIVE mg/dL
HGB URINE DIPSTICK: NEGATIVE
KETONES UR: NEGATIVE mg/dL
NITRITE: NEGATIVE
Protein, ur: NEGATIVE mg/dL
SPECIFIC GRAVITY, URINE: 1.02 (ref 1.005–1.030)
Urobilinogen, UA: 0.2 mg/dL (ref 0.0–1.0)
pH: 7 (ref 5.0–8.0)

## 2017-08-07 MED ORDER — TERCONAZOLE 0.4 % VA CREA: 1.0000 | TOPICAL_CREAM | Freq: Every day | VAGINAL | 0 refills | Status: DC

## 2017-08-07 NOTE — Progress Notes (Signed)
Subjective:    Stacy Christian is being seen today for her first obstetrical visit. She is at 4084w3d gestation bu 5.6 week US. Her obstetrical history is significant for spontaneous PTD at 23.6. Baby was breech adn was delivered by C/S w/ extension of incision into fundus. Baby dies of severe prematurity. Relationship with FOB: significant other, not living together. Patient does intend to breast feed. Pregnancy history fully reviewed.  Was seen in MAU July 2018. [place] Showed IUP. Pos Chlamydia. She was Tx'd and is not with partner anymore.  Patient reports Vaginal discharge.                                Review of Systems:   Review of Systems  Constitutional: Negative for chills and fever.  Gastrointestinal: Negative for abdominal pain, nausea and vomiting.  Genitourinary: Positive for vaginal discharge. Negative for vaginal bleeding.    Objective:     BP 127/79   Pulse 79   Wt 148 lb 9.6 oz (67.4 kg)   LMP 04/14/2017 (Approximate) Comment: Can't remember if she had a period in June  BMI 23.98 kg/m  Physical Exam  Nursing note and vitals reviewed. Constitutional: She is oriented to person, place, and time. She appears well-developed and well-nourished. No distress.  Eyes: No scleral icterus.  Neck: No thyromegaly present.  Cardiovascular: Normal rate, regular rhythm and normal heart sounds.   No murmur heard. Respiratory: Effort normal and breath sounds normal. No respiratory distress. Right breast exhibits no inverted nipple, no mass and no nipple discharge. Left breast exhibits no inverted nipple, no mass and no nipple discharge.  GI: Soft. Bowel sounds are normal. There is no tenderness.  Genitourinary: There is breast tenderness. No breast discharge or bleeding. There is no lesion on the right labia. There is no lesion on the left labia. Uterus is enlarged. Cervix exhibits no motion tenderness, no discharge and no friability. Right adnexum displays no tenderness. Left adnexum  displays no tenderness. There is erythema in the vagina. No bleeding in the vagina. Vaginal discharge found.  Musculoskeletal: She exhibits no edema or tenderness.  Neurological: She is alert and oriented to person, place, and time. She has normal reflexes.  Skin: Skin is warm and dry.  Psychiatric: She has a normal mood and affect.    Maternal Exam:  Introitus: Vagina is positive for vaginal discharge.    Pos FHT per US   Assessment:    Pregnancy: G3P0110 Patient Active Problem List   Diagnosis Date Noted  . Supervision of high risk pregnancy, antepartum 08/07/2017  . History of premature delivery, currently pregnant 08/07/2017  . History of cesarean delivery, currently pregnant 08/07/2017  . Chlamydia infection affecting pregnancy 07/24/2017     1. Supervision of high risk pregnancy, antepartum  - Cytology - PAP - Obstetric Panel, Including HIV - Hemoglobinopathy Evaluation - Culture, OB Urine - US OB Limited  2. History of premature delivery, currently pregnant - 17-P 16-36 weeks  3. History of cesarean delivery, currently pregnant--Extension in fundus - Plan repeat C/S at 37 weeks   4. Encounter to determine fetal viability of pregnancy, single or unspecified fetus  - US OB Limited  5. Vaginal yeast infection  - terconazole (TERAZOL 7) 0.4 % vaginal cream; Place 1 applicator vaginally at bedtime.  Dispense: 45 g; Refill: 0  6. Chlamydia pregnancy - TOC today   Plan:     Initial labs drawn. Prenatal  vitamins. Problem list reviewed and updated. AFP3 discussed: ordered. Role of ultrasound in pregnancy discussed; fetal survey: requested. Amniocentesis discussed: not indicated. Follow up in 4 weeks.   Dorathy Kinsman 08/07/2017

## 2017-08-07 NOTE — Patient Instructions (Signed)

## 2017-08-07 NOTE — Progress Notes (Signed)
Pt informed that the ultrasound is considered a limited OB ultrasound and is not intended to be a complete ultrasound exam.  Patient also informed that the ultrasound is not being completed with the intent of assessing for fetal or placental anomalies or any pelvic abnormalities.  Explained that the purpose of today's ultrasound is to assess for  viability.  Patient acknowledges the purpose of the exam and the limitations of the study.    

## 2017-08-08 LAB — OBSTETRIC PANEL, INCLUDING HIV
ANTIBODY SCREEN: NEGATIVE
BASOS: 1 %
Basophils Absolute: 0 10*3/uL (ref 0.0–0.2)
EOS (ABSOLUTE): 0.2 10*3/uL (ref 0.0–0.4)
EOS: 3 %
HEMATOCRIT: 39 % (ref 34.0–46.6)
HEMOGLOBIN: 12.9 g/dL (ref 11.1–15.9)
HIV Screen 4th Generation wRfx: NONREACTIVE
Hepatitis B Surface Ag: NEGATIVE
Immature Grans (Abs): 0 10*3/uL (ref 0.0–0.1)
Immature Granulocytes: 0 %
LYMPHS: 22 %
Lymphocytes Absolute: 1.3 10*3/uL (ref 0.7–3.1)
MCH: 28.9 pg (ref 26.6–33.0)
MCHC: 33.1 g/dL (ref 31.5–35.7)
MCV: 87 fL (ref 79–97)
MONOCYTES: 10 %
Monocytes Absolute: 0.6 10*3/uL (ref 0.1–0.9)
Neutrophils Absolute: 4 10*3/uL (ref 1.4–7.0)
Neutrophils: 64 %
Platelets: 257 10*3/uL (ref 150–379)
RBC: 4.46 x10E6/uL (ref 3.77–5.28)
RDW: 13.3 % (ref 12.3–15.4)
RPR: NONREACTIVE
RUBELLA: 2.15 {index} (ref >0.99)
Rh Factor: POSITIVE
WBC: 6.1 10*3/uL (ref 3.4–10.8)

## 2017-08-08 LAB — HEMOGLOBINOPATHY EVALUATION
Ferritin: 83 ng/mL (ref 15–150)
HGB A: 97.7 % (ref 96.4–98.8)
HGB S: 0 %
HGB SOLUBILITY: NEGATIVE
HGB VARIANT: 0 %
Hgb A2 Quant: 2.3 % (ref 1.8–3.2)
Hgb C: 0 %
Hgb F Quant: 0 % (ref 0.0–2.0)

## 2017-08-09 LAB — CYTOLOGY - PAP
CANDIDA VAGINITIS: POSITIVE — AB
Chlamydia: NEGATIVE
DIAGNOSIS: NEGATIVE
Neisseria Gonorrhea: NEGATIVE
Trichomonas: NEGATIVE

## 2017-08-14 ENCOUNTER — Encounter: Payer: Self-pay | Admitting: Advanced Practice Midwife

## 2017-08-14 DIAGNOSIS — R8271 Bacteriuria: Secondary | ICD-10-CM | POA: Insufficient documentation

## 2017-08-14 LAB — URINE CULTURE, OB REFLEX

## 2017-08-14 LAB — CULTURE, OB URINE

## 2017-08-23 ENCOUNTER — Encounter (HOSPITAL_COMMUNITY): Payer: Self-pay

## 2017-08-23 ENCOUNTER — Ambulatory Visit (HOSPITAL_COMMUNITY)
Admission: RE | Admit: 2017-08-23 | Discharge: 2017-08-23 | Disposition: A | Discharge status: Home / Self Care | Payer: Medicaid Other | Source: Ambulatory Visit | Attending: Advanced Practice Midwife | Admitting: Advanced Practice Midwife

## 2017-08-23 ENCOUNTER — Ambulatory Visit (HOSPITAL_COMMUNITY): Admission: RE | Admit: 2017-08-23 | Payer: Medicaid Other | Source: Ambulatory Visit

## 2017-08-23 ENCOUNTER — Other Ambulatory Visit (HOSPITAL_COMMUNITY): Payer: Self-pay | Admitting: *Deleted

## 2017-08-23 ENCOUNTER — Other Ambulatory Visit: Payer: Self-pay | Admitting: Advanced Practice Midwife

## 2017-08-23 DIAGNOSIS — Z1379 Encounter for other screening for genetic and chromosomal anomalies: Secondary | ICD-10-CM | POA: Diagnosis present

## 2017-08-23 DIAGNOSIS — Z3682 Encounter for antenatal screening for nuchal translucency: Secondary | ICD-10-CM

## 2017-08-23 DIAGNOSIS — Z3A12 12 weeks gestation of pregnancy: Secondary | ICD-10-CM

## 2017-08-23 DIAGNOSIS — O09291 Supervision of pregnancy with other poor reproductive or obstetric history, first trimester: Secondary | ICD-10-CM | POA: Insufficient documentation

## 2017-08-23 DIAGNOSIS — O09891 Supervision of other high risk pregnancies, first trimester: Secondary | ICD-10-CM | POA: Insufficient documentation

## 2017-08-23 DIAGNOSIS — O09299 Supervision of pregnancy with other poor reproductive or obstetric history, unspecified trimester: Secondary | ICD-10-CM

## 2017-08-23 DIAGNOSIS — O34219 Maternal care for unspecified type scar from previous cesarean delivery: Secondary | ICD-10-CM | POA: Diagnosis not present

## 2017-08-30 ENCOUNTER — Other Ambulatory Visit (HOSPITAL_COMMUNITY): Payer: Self-pay | Admitting: Maternal & Fetal Medicine

## 2017-08-30 ENCOUNTER — Encounter (HOSPITAL_COMMUNITY): Payer: Self-pay

## 2017-08-30 ENCOUNTER — Ambulatory Visit (HOSPITAL_COMMUNITY)
Admission: RE | Admit: 2017-08-30 | Discharge: 2017-08-30 | Disposition: A | Discharge status: Home / Self Care | Payer: Medicaid Other | Source: Ambulatory Visit | Attending: Advanced Practice Midwife | Admitting: Advanced Practice Midwife

## 2017-08-30 ENCOUNTER — Other Ambulatory Visit (HOSPITAL_COMMUNITY): Payer: Self-pay | Admitting: *Deleted

## 2017-08-30 ENCOUNTER — Ambulatory Visit (HOSPITAL_COMMUNITY): Admission: RE | Admit: 2017-08-30 | Payer: Medicaid Other | Source: Ambulatory Visit

## 2017-08-30 DIAGNOSIS — O09211 Supervision of pregnancy with history of pre-term labor, first trimester: Secondary | ICD-10-CM

## 2017-08-30 DIAGNOSIS — O98811 Other maternal infectious and parasitic diseases complicating pregnancy, first trimester: Secondary | ICD-10-CM

## 2017-08-30 DIAGNOSIS — A749 Chlamydial infection, unspecified: Secondary | ICD-10-CM

## 2017-08-30 DIAGNOSIS — Z3682 Encounter for antenatal screening for nuchal translucency: Secondary | ICD-10-CM | POA: Insufficient documentation

## 2017-08-30 DIAGNOSIS — R8271 Bacteriuria: Secondary | ICD-10-CM

## 2017-08-30 DIAGNOSIS — O09219 Supervision of pregnancy with history of pre-term labor, unspecified trimester: Principal | ICD-10-CM

## 2017-08-30 DIAGNOSIS — Z3A13 13 weeks gestation of pregnancy: Secondary | ICD-10-CM

## 2017-08-30 DIAGNOSIS — O09899 Supervision of other high risk pregnancies, unspecified trimester: Secondary | ICD-10-CM

## 2017-08-30 DIAGNOSIS — O34211 Maternal care for low transverse scar from previous cesarean delivery: Secondary | ICD-10-CM | POA: Insufficient documentation

## 2017-09-03 ENCOUNTER — Ambulatory Visit (INDEPENDENT_AMBULATORY_CARE_PROVIDER_SITE_OTHER): Payer: Medicaid Other | Admitting: Obstetrics and Gynecology

## 2017-09-03 ENCOUNTER — Encounter: Payer: Self-pay | Admitting: Obstetrics and Gynecology

## 2017-09-03 VITALS — BP 116/67 | HR 63 | Wt 163.4 lb

## 2017-09-03 DIAGNOSIS — O0992 Supervision of high risk pregnancy, unspecified, second trimester: Secondary | ICD-10-CM | POA: Diagnosis not present

## 2017-09-03 DIAGNOSIS — Z23 Encounter for immunization: Secondary | ICD-10-CM

## 2017-09-03 DIAGNOSIS — O09212 Supervision of pregnancy with history of pre-term labor, second trimester: Secondary | ICD-10-CM

## 2017-09-03 DIAGNOSIS — O09219 Supervision of pregnancy with history of pre-term labor, unspecified trimester: Principal | ICD-10-CM

## 2017-09-03 DIAGNOSIS — O0990 Supervision of high risk pregnancy, unspecified, unspecified trimester: Secondary | ICD-10-CM

## 2017-09-03 DIAGNOSIS — O09899 Supervision of other high risk pregnancies, unspecified trimester: Secondary | ICD-10-CM

## 2017-09-03 MED ORDER — PRENATAL VITAMINS 28-0.8 MG PO TABS: 1.0000 | ORAL_TABLET | Freq: Every day | ORAL | 6 refills | Status: DC

## 2017-09-03 MED ORDER — NITROFURANTOIN MONOHYD MACRO 100 MG PO CAPS: 100.0000 mg | ORAL_CAPSULE | Freq: Two times a day (BID) | ORAL | 0 refills | Status: DC

## 2017-09-03 NOTE — Progress Notes (Signed)
Prenatal Visit Note Date: 09/03/2017 Clinic: [facility]  Subjective:  Stacy Christian is a 26 y.o. G3P0110 at [redacted]w[redacted]d being seen today for ongoing prenatal care.  She is currently monitored for the following issues for this high-risk pregnancy and has Chlamydia infection affecting pregnancy; Supervision of high risk pregnancy, antepartum; History of premature delivery, currently pregnant; History of classical cesarean section; and GBS bacteriuria on her problem list.  Patient reports no complaints.   Contractions: Not present. Vag. Bleeding: None.  Movement: Absent. Denies leaking of fluid.   The following portions of the patient's history were reviewed and updated as appropriate: allergies, current medications, past family history, past medical history, past social history, past surgical history and problem list. Problem list updated.  Objective:   Vitals:   09/03/17 0923  BP: 116/67  Pulse: 63  Weight: 163 lb 6.4 oz (74.1 kg)    Fetal Status: Fetal Heart Rate (bpm): 175   Movement: Absent     General:  Alert, oriented and cooperative. Patient is in no acute distress.  Skin: Skin is warm and dry. No rash noted.   Cardiovascular: Normal heart rate noted  Respiratory: Normal respiratory effort, no problems with respiration noted  Abdomen: Soft, gravid, appropriate for gestational age. Pain/Pressure: Absent     Pelvic:  Cervical exam deferred        Extremities: Normal range of motion.  Edema: None  Mental Status: Normal mood and affect. Normal behavior. Normal judgment and thought content.   Urinalysis:      Assessment and Plan:  Pregnancy: G3P0110 at [redacted]w[redacted]d  1. History of premature delivery, currently pregnant 17p ordered already and pt already set up to start that and CLs at 16wks - Prenatal Vit-Fe Fumarate-FA (PRENATAL VITAMINS) 28-0.8 MG TABS; Take 1 tablet by mouth daily. (Patient not taking: Reported on 09/03/2017)  Dispense: 30 tablet; Refill: 6  2.  Supervision of high risk pregnancy, antepartum Quad screen nv. Anatomy u/s already ordered. macrobid sent in for UTI last visit.  - Prenatal Vit-Fe Fumarate-FA (PRENATAL VITAMINS) 28-0.8 MG TABS; Take 1 tablet by mouth daily. (Patient not taking: Reported on 09/03/2017)  Dispense: 30 tablet; Refill: 6  Preterm labor symptoms and general obstetric precautions including but not limited to vaginal bleeding, contractions, leaking of fluid and fetal movement were reviewed in detail with the patient. Please refer to After Visit Summary for other counseling recommendations.  Return in about 2 weeks (around 09/18/2017) for rob and 17p.   [place] Bing, MD

## 2017-09-18 ENCOUNTER — Other Ambulatory Visit (HOSPITAL_COMMUNITY): Payer: Self-pay | Admitting: Obstetrics and Gynecology

## 2017-09-18 ENCOUNTER — Ambulatory Visit (HOSPITAL_COMMUNITY)
Admission: RE | Admit: 2017-09-18 | Discharge: 2017-09-18 | Disposition: A | Discharge status: Home / Self Care | Payer: Medicaid Other | Source: Ambulatory Visit | Attending: Advanced Practice Midwife | Admitting: Advanced Practice Midwife

## 2017-09-18 ENCOUNTER — Encounter: Payer: Medicaid Other | Admitting: Advanced Practice Midwife

## 2017-09-18 DIAGNOSIS — Z3A16 16 weeks gestation of pregnancy: Secondary | ICD-10-CM

## 2017-09-18 DIAGNOSIS — O09292 Supervision of pregnancy with other poor reproductive or obstetric history, second trimester: Secondary | ICD-10-CM | POA: Insufficient documentation

## 2017-09-18 DIAGNOSIS — Z3686 Encounter for antenatal screening for cervical length: Secondary | ICD-10-CM | POA: Insufficient documentation

## 2017-09-18 DIAGNOSIS — O34219 Maternal care for unspecified type scar from previous cesarean delivery: Secondary | ICD-10-CM | POA: Diagnosis not present

## 2017-09-18 DIAGNOSIS — O98811 Other maternal infectious and parasitic diseases complicating pregnancy, first trimester: Secondary | ICD-10-CM

## 2017-09-18 DIAGNOSIS — A749 Chlamydial infection, unspecified: Secondary | ICD-10-CM

## 2017-09-18 DIAGNOSIS — O09212 Supervision of pregnancy with history of pre-term labor, second trimester: Secondary | ICD-10-CM | POA: Insufficient documentation

## 2017-09-18 DIAGNOSIS — O09899 Supervision of other high risk pregnancies, unspecified trimester: Secondary | ICD-10-CM

## 2017-09-18 DIAGNOSIS — O09219 Supervision of pregnancy with history of pre-term labor, unspecified trimester: Secondary | ICD-10-CM

## 2017-09-18 DIAGNOSIS — R8271 Bacteriuria: Secondary | ICD-10-CM

## 2017-09-24 ENCOUNTER — Ambulatory Visit (INDEPENDENT_AMBULATORY_CARE_PROVIDER_SITE_OTHER): Payer: Medicaid Other | Admitting: Obstetrics & Gynecology

## 2017-09-24 ENCOUNTER — Other Ambulatory Visit (HOSPITAL_COMMUNITY)
Admission: RE | Admit: 2017-09-24 | Discharge: 2017-09-24 | Disposition: A | Discharge status: Home / Self Care | Payer: Medicaid Other | Source: Ambulatory Visit | Attending: Obstetrics & Gynecology | Admitting: Obstetrics & Gynecology

## 2017-09-24 VITALS — BP 131/76 | HR 71 | Wt 173.0 lb

## 2017-09-24 DIAGNOSIS — O26892 Other specified pregnancy related conditions, second trimester: Secondary | ICD-10-CM

## 2017-09-24 DIAGNOSIS — Z113 Encounter for screening for infections with a predominantly sexual mode of transmission: Secondary | ICD-10-CM | POA: Diagnosis not present

## 2017-09-24 DIAGNOSIS — O09219 Supervision of pregnancy with history of pre-term labor, unspecified trimester: Secondary | ICD-10-CM

## 2017-09-24 DIAGNOSIS — O09212 Supervision of pregnancy with history of pre-term labor, second trimester: Secondary | ICD-10-CM | POA: Diagnosis not present

## 2017-09-24 DIAGNOSIS — O0990 Supervision of high risk pregnancy, unspecified, unspecified trimester: Secondary | ICD-10-CM

## 2017-09-24 DIAGNOSIS — N898 Other specified noninflammatory disorders of vagina: Secondary | ICD-10-CM | POA: Diagnosis not present

## 2017-09-24 DIAGNOSIS — O0992 Supervision of high risk pregnancy, unspecified, second trimester: Secondary | ICD-10-CM

## 2017-09-24 DIAGNOSIS — O09899 Supervision of other high risk pregnancies, unspecified trimester: Secondary | ICD-10-CM

## 2017-09-24 LAB — POCT URINALYSIS DIP (DEVICE)
Bilirubin Urine: NEGATIVE
Glucose, UA: NEGATIVE mg/dL
HGB URINE DIPSTICK: NEGATIVE
Ketones, ur: NEGATIVE mg/dL
LEUKOCYTES UA: NEGATIVE
Nitrite: NEGATIVE
PH: 7 (ref 5.0–8.0)
Protein, ur: NEGATIVE mg/dL
SPECIFIC GRAVITY, URINE: 1.02 (ref 1.005–1.030)
UROBILINOGEN UA: 0.2 mg/dL (ref 0.0–1.0)

## 2017-09-24 MED ORDER — HYDROXYPROGESTERONE CAPROATE 250 MG/ML IM OIL
250.0000 mg | TOPICAL_OIL | INTRAMUSCULAR | Status: AC
  Administered 2017-09-24 – 2017-11-28 (×9): 250 mg via INTRAMUSCULAR

## 2017-09-24 NOTE — Progress Notes (Signed)
   PRENATAL VISIT NOTE  Subjective:  Stacy Christian is a 26 y.o. G3P0110 at 621w2d being seen today for ongoing prenatal care.  She is currently monitored for the following issues for this high-risk pregnancy and has Chlamydia infection affecting pregnancy; Supervision of high risk pregnancy, antepartum; History of premature delivery, currently pregnant; History of classical cesarean section; and GBS bacteriuria on her problem list.  Patient reports white chunky discharge, no itching, no bleeding.  No burning with urination..  Contractions: Not present. Vag. Bleeding: None.  Movement: Absent. Denies leaking of fluid.   The following portions of the patient's history were reviewed and updated as appropriate: allergies, current medications, past family history, past medical history, past social history, past surgical history and problem list. Problem list updated.  Objective:   Vitals:   09/24/17 1620  BP: 131/76  Pulse: 71  Weight: 173 lb (78.5 kg)    Fetal Status: Fetal Heart Rate (bpm): 159   Movement: Absent     General:  Alert, oriented and cooperative. Patient is in no acute distress.  Skin: Skin is warm and dry. No rash noted.   Cardiovascular: Normal heart rate noted  Respiratory: Normal respiratory effort, no problems with respiration noted  Abdomen: Soft, gravid, appropriate for gestational age.  Pain/Pressure: Present     Pelvic: Cervical exam performed      closed/long  Extremities: Normal range of motion.  Edema: None  Mental Status:  Normal mood and affect. Normal behavior. Normal judgment and thought content.   Assessment and Plan:  Pregnancy: G3P0110 at [redacted]w[redacted]d  1. Supervision of high risk pregnancy, antepartum Having discharge--wet prep done.  Clinically yeast.  Will also send rpt urine culture as patient has not taken antibiotics that were prescribed.  The pharmacist said it was on back order.  2. History of premature delivery, currently pregnant - AFP TETRA -  hydroxyprogesterone caproate (MAKENA) 250 mg/mL injection 250 mg; Inject 1 mL (250 mg total) into the muscle once a week. - serial cervical US.  Preterm labor symptoms and general obstetric precautions including but not limited to vaginal bleeding, contractions, leaking of fluid and fetal movement were reviewed in detail with the patient. Please refer to After Visit Summary for other counseling recommendations.  Return in about 2 weeks (around 10/08/2017).   Elsie LincolnKelly Leggett, MD

## 2017-09-26 LAB — CULTURE, OB URINE

## 2017-09-26 LAB — CERVICOVAGINAL ANCILLARY ONLY: WET PREP (BD AFFIRM): POSITIVE — AB

## 2017-09-26 LAB — URINE CULTURE, OB REFLEX: ORGANISM ID, BACTERIA: NO GROWTH

## 2017-10-01 ENCOUNTER — Other Ambulatory Visit: Payer: Self-pay | Admitting: Obstetrics & Gynecology

## 2017-10-01 MED ORDER — METRONIDAZOLE 500 MG PO TABS: 500.0000 mg | ORAL_TABLET | Freq: Two times a day (BID) | ORAL | 0 refills | Status: DC

## 2017-10-01 NOTE — Progress Notes (Signed)
Wet prep show BV.  Will treat with Flagyl.  RX to pharmacy.  RN to call patient to go pick up Rx.

## 2017-10-02 ENCOUNTER — Encounter (HOSPITAL_COMMUNITY): Payer: Self-pay

## 2017-10-02 ENCOUNTER — Ambulatory Visit (HOSPITAL_COMMUNITY)
Admission: RE | Admit: 2017-10-02 | Discharge: 2017-10-02 | Disposition: A | Discharge status: Home / Self Care | Payer: Medicaid Other | Source: Ambulatory Visit | Attending: Advanced Practice Midwife | Admitting: Advanced Practice Midwife

## 2017-10-02 ENCOUNTER — Other Ambulatory Visit (HOSPITAL_COMMUNITY): Payer: Self-pay | Admitting: Obstetrics and Gynecology

## 2017-10-02 ENCOUNTER — Other Ambulatory Visit (HOSPITAL_COMMUNITY): Payer: Self-pay | Admitting: *Deleted

## 2017-10-02 DIAGNOSIS — Z3A18 18 weeks gestation of pregnancy: Secondary | ICD-10-CM | POA: Diagnosis not present

## 2017-10-02 DIAGNOSIS — O358XX0 Maternal care for other (suspected) fetal abnormality and damage, not applicable or unspecified: Secondary | ICD-10-CM | POA: Diagnosis not present

## 2017-10-02 DIAGNOSIS — O09899 Supervision of other high risk pregnancies, unspecified trimester: Secondary | ICD-10-CM

## 2017-10-02 DIAGNOSIS — O09219 Supervision of pregnancy with history of pre-term labor, unspecified trimester: Principal | ICD-10-CM

## 2017-10-02 DIAGNOSIS — Z3689 Encounter for other specified antenatal screening: Secondary | ICD-10-CM

## 2017-10-02 DIAGNOSIS — O34219 Maternal care for unspecified type scar from previous cesarean delivery: Secondary | ICD-10-CM | POA: Insufficient documentation

## 2017-10-02 DIAGNOSIS — Z3686 Encounter for antenatal screening for cervical length: Secondary | ICD-10-CM | POA: Insufficient documentation

## 2017-10-02 DIAGNOSIS — O09212 Supervision of pregnancy with history of pre-term labor, second trimester: Secondary | ICD-10-CM | POA: Insufficient documentation

## 2017-10-08 ENCOUNTER — Ambulatory Visit (INDEPENDENT_AMBULATORY_CARE_PROVIDER_SITE_OTHER): Payer: Medicaid Other | Admitting: Obstetrics and Gynecology

## 2017-10-08 ENCOUNTER — Encounter: Payer: Self-pay | Admitting: Obstetrics and Gynecology

## 2017-10-08 VITALS — BP 124/83 | HR 91 | Wt 179.0 lb

## 2017-10-08 DIAGNOSIS — R8271 Bacteriuria: Secondary | ICD-10-CM

## 2017-10-08 DIAGNOSIS — O0992 Supervision of high risk pregnancy, unspecified, second trimester: Secondary | ICD-10-CM | POA: Diagnosis present

## 2017-10-08 DIAGNOSIS — O09212 Supervision of pregnancy with history of pre-term labor, second trimester: Secondary | ICD-10-CM | POA: Diagnosis not present

## 2017-10-08 DIAGNOSIS — O35EXX0 Maternal care for other (suspected) fetal abnormality and damage, fetal genitourinary anomalies, not applicable or unspecified: Secondary | ICD-10-CM | POA: Insufficient documentation

## 2017-10-08 DIAGNOSIS — Z98891 History of uterine scar from previous surgery: Secondary | ICD-10-CM | POA: Diagnosis not present

## 2017-10-08 DIAGNOSIS — O358XX0 Maternal care for other (suspected) fetal abnormality and damage, not applicable or unspecified: Secondary | ICD-10-CM

## 2017-10-08 DIAGNOSIS — O09899 Supervision of other high risk pregnancies, unspecified trimester: Secondary | ICD-10-CM

## 2017-10-08 DIAGNOSIS — O09219 Supervision of pregnancy with history of pre-term labor, unspecified trimester: Secondary | ICD-10-CM

## 2017-10-08 DIAGNOSIS — O0990 Supervision of high risk pregnancy, unspecified, unspecified trimester: Secondary | ICD-10-CM

## 2017-10-08 NOTE — Progress Notes (Signed)
Subjective:  Gweneth DimitriCharmya E Saltsman is a 26 y.o. G3P0110 at 7266w2d being seen today for ongoing prenatal care.  She is currently monitored for the following issues for this high-risk pregnancy and has Supervision of high risk pregnancy, antepartum; History of premature delivery, currently pregnant; History of classical cesarean section; GBS bacteriuria; and Fetal renal anomaly, single gestation on their problem list.  Patient reports no complaints.  Contractions: Not present. Vag. Bleeding: None.  Movement: Present. Denies leaking of fluid.   The following portions of the patient's history were reviewed and updated as appropriate: allergies, current medications, past family history, past medical history, past social history, past surgical history and problem list. Problem list updated.  Objective:   Vitals:   10/08/17 1256  BP: 124/83  Pulse: 91  Weight: 81.2 kg (179 lb)    Fetal Status: Fetal Heart Rate (bpm): 154   Movement: Present     General:  Alert, oriented and cooperative. Patient is in no acute distress.  Skin: Skin is warm and dry. No rash noted.   Cardiovascular: Normal heart rate noted  Respiratory: Normal respiratory effort, no problems with respiration noted  Abdomen: Soft, gravid, appropriate for gestational age. Pain/Pressure: Absent     Pelvic:  Cervical exam deferred        Extremities: Normal range of motion.  Edema: None  Mental Status: Normal mood and affect. Normal behavior. Normal judgment and thought content.   Urinalysis:      Assessment and Plan:  Pregnancy: G3P0110 at 2466w2d  1. Supervision of high risk pregnancy, antepartum Stable AFP today  2. History of premature delivery, currently pregnant Continue with q weekly CL and 17 OHP  3. GBS bacteriuria Tx while in labor  4. History of classical cesarean section  5. Fetal renal anomaly, single gestation F/U U/S scheduled  Preterm labor symptoms and general obstetric precautions including but not  limited to vaginal bleeding, contractions, leaking of fluid and fetal movement were reviewed in detail with the patient. Please refer to After Visit Summary for other counseling recommendations.  Return in about 4 weeks (around 11/05/2017) for OB visit.   Hermina StaggersErvin, Karolyna Bianchini L, MD

## 2017-10-08 NOTE — Patient Instructions (Signed)
AREA PEDIATRIC/FAMILY PRACTICE PHYSICIANS  [facility] [address], [address] [place], [place]  [address] Phone - [phone]   Fax - [phone]  [facility] [address] [place], [place] [address] Phone - [phone]   Fax - [phone]  [facility] AM[facility] [address] [place], [place]  [address] Phone - [phone]   Fax - [phone]  [facility] [address] [place], [place]  [address] Phone - [phone]   Fax - [phone]  [facility] [address] [place], [place]  [address] Phone - [phone]   Fax - [phone]  [facility] [address] [place], [place]  [address] Phone - [phone]   Fax - [phone]  [facility] [address] [place], [place]  [address] Phone - [phone]   Fax - [phone]  [facility] [address], [address] [place], [place]  [address] Phone - [phone]   Fax - [phone]  [facility] [address] [place], [place]  [address] Phone - [phone]   Fax - [phone] [facility] [address] [place], [place]  [address] Phone - [phone]   Fax - [phone]  [facility] [address] [place], [place]  [address] Phone - [phone]   Fax - [phone]  [facility] [address] [place], [place]  [address] Phone - [phone]   Fax - [phone]  [facility] [address] [place], [place]  [address] Phone - [phone]   Fax - [phone]  SHILPA GOSRANI [address] [place], [place]  [address] Phone - [phone]  [facility] [address] [place], [place]  [address] Phone - [phone]   Fax - [phone]  [facility] [address] [place], [place]  [address] Phone - [phone]   Fax - [phone]  [facility] [address] [place], [place]  [address] Phone - [phone]   Fax - [phone]  [facility] [address] [place], [place]  [address] Phone - [phone]   Fax - [phone]   [facility] [address] [place], [place]  [address] Phone - [phone]   Fax - [phone]  [facility] [address] [place], [place]  [address] Phone - [phone]   Fax - [phone]  DAVID RUBIN [address] [place], [place]  [address] Phone - [phone]   Fax - [phone]  [facility] [address] [place], [place]  [address] Phone - [phone]   Fax - [phone]  [facility] [address] , [place]  [address] Phone - [phone]   Fax - [phone] [facility] [address] [place], [place]  [address] Phone - [phone]   Fax - [phone]  [facility] [address] [place], [place]  [address] Phone - [phone]   Fax - [phone]   [facility] [address] [place], [place]  [address] Phone - [phone]   Fax - [phone]  [facility] Charlene Flemming MD [address] [place]  [address] Phone [phone]  Fax [phone]   

## 2017-10-10 LAB — AFP TETRA
DIA MOM VALUE: 3.8
DIA Value (EIA): 615.08 pg/mL
DSR (BY AGE) 1 IN: 924
DSR (SECOND TRIMESTER) 1 IN: 596
Gestational Age: 19.2 WEEKS
MATERNAL AGE AT EDD: 27 a
MSAFP Mom: 2.18
MSAFP: 107.5 ng/mL
MSHCG Mom: 1.62
MSHCG: 35631 m[IU]/mL
OSB RISK: 1092
T18 (By Age): 1:3600 {titer}
TEST RESULTS AFP: NEGATIVE
WEIGHT: 179 [lb_av]
uE3 Mom: 0.68
uE3 Value: 1.02 ng/mL

## 2017-10-12 ENCOUNTER — Encounter (HOSPITAL_COMMUNITY): Payer: Self-pay

## 2017-10-15 ENCOUNTER — Ambulatory Visit (INDEPENDENT_AMBULATORY_CARE_PROVIDER_SITE_OTHER): Payer: Medicaid Other | Admitting: *Deleted

## 2017-10-15 VITALS — BP 129/73 | HR 74

## 2017-10-15 DIAGNOSIS — O09212 Supervision of pregnancy with history of pre-term labor, second trimester: Secondary | ICD-10-CM | POA: Diagnosis present

## 2017-10-15 DIAGNOSIS — O09899 Supervision of other high risk pregnancies, unspecified trimester: Secondary | ICD-10-CM

## 2017-10-15 DIAGNOSIS — O09219 Supervision of pregnancy with history of pre-term labor, unspecified trimester: Principal | ICD-10-CM

## 2017-10-15 NOTE — Progress Notes (Signed)
Presents to clinic for 17-p injection. Patient concerned, says she hasn't been feeling the baby as much, requests fht. FHT=160, baby active. 17-p given, tolerated well.

## 2017-10-15 NOTE — Progress Notes (Signed)
I have reviewed the nurses note, and agree with the plan. Continue 17-P injections  Thressa ShellerHeather Alishia Lebo 12:37 PM 10/15/17

## 2017-10-16 ENCOUNTER — Encounter (HOSPITAL_COMMUNITY): Payer: Self-pay

## 2017-10-16 ENCOUNTER — Ambulatory Visit (HOSPITAL_COMMUNITY)
Admission: RE | Admit: 2017-10-16 | Discharge: 2017-10-16 | Disposition: A | Discharge status: Home / Self Care | Payer: Medicaid Other | Source: Ambulatory Visit | Attending: Advanced Practice Midwife | Admitting: Advanced Practice Midwife

## 2017-10-16 DIAGNOSIS — O09212 Supervision of pregnancy with history of pre-term labor, second trimester: Secondary | ICD-10-CM | POA: Diagnosis present

## 2017-10-16 DIAGNOSIS — Z3A20 20 weeks gestation of pregnancy: Secondary | ICD-10-CM | POA: Diagnosis not present

## 2017-10-16 DIAGNOSIS — O09219 Supervision of pregnancy with history of pre-term labor, unspecified trimester: Secondary | ICD-10-CM

## 2017-10-16 DIAGNOSIS — O09899 Supervision of other high risk pregnancies, unspecified trimester: Secondary | ICD-10-CM

## 2017-10-22 ENCOUNTER — Ambulatory Visit (INDEPENDENT_AMBULATORY_CARE_PROVIDER_SITE_OTHER): Payer: Medicaid Other | Admitting: General Practice

## 2017-10-22 DIAGNOSIS — O09219 Supervision of pregnancy with history of pre-term labor, unspecified trimester: Principal | ICD-10-CM

## 2017-10-22 DIAGNOSIS — O09899 Supervision of other high risk pregnancies, unspecified trimester: Secondary | ICD-10-CM

## 2017-10-22 DIAGNOSIS — O09212 Supervision of pregnancy with history of pre-term labor, second trimester: Secondary | ICD-10-CM | POA: Diagnosis not present

## 2017-10-22 NOTE — Progress Notes (Signed)
Patient here for 17p today. Patient requests to hear the baby's heart beat because she worries about the baby. Patient states she is feeling the baby move but she thought she would be feeling big constant movements. Told patient she is still early but should be noticing slight movements from baby. FHR 159 and patient reassured.

## 2017-10-26 ENCOUNTER — Encounter (HOSPITAL_COMMUNITY): Payer: Self-pay

## 2017-10-29 ENCOUNTER — Ambulatory Visit (INDEPENDENT_AMBULATORY_CARE_PROVIDER_SITE_OTHER): Payer: Medicaid Other | Admitting: General Practice

## 2017-10-29 DIAGNOSIS — O09212 Supervision of pregnancy with history of pre-term labor, second trimester: Secondary | ICD-10-CM

## 2017-10-29 DIAGNOSIS — O09899 Supervision of other high risk pregnancies, unspecified trimester: Secondary | ICD-10-CM

## 2017-10-29 DIAGNOSIS — O09219 Supervision of pregnancy with history of pre-term labor, unspecified trimester: Principal | ICD-10-CM

## 2017-10-30 ENCOUNTER — Ambulatory Visit (HOSPITAL_COMMUNITY)
Admission: RE | Admit: 2017-10-30 | Discharge: 2017-10-30 | Disposition: A | Discharge status: Home / Self Care | Payer: Medicaid Other | Source: Ambulatory Visit | Attending: Advanced Practice Midwife | Admitting: Advanced Practice Midwife

## 2017-10-30 ENCOUNTER — Other Ambulatory Visit (HOSPITAL_COMMUNITY): Payer: Self-pay | Admitting: Obstetrics and Gynecology

## 2017-10-30 ENCOUNTER — Encounter (HOSPITAL_COMMUNITY): Payer: Self-pay

## 2017-10-30 DIAGNOSIS — O09899 Supervision of other high risk pregnancies, unspecified trimester: Secondary | ICD-10-CM

## 2017-10-30 DIAGNOSIS — Z3A22 22 weeks gestation of pregnancy: Secondary | ICD-10-CM | POA: Insufficient documentation

## 2017-10-30 DIAGNOSIS — O09219 Supervision of pregnancy with history of pre-term labor, unspecified trimester: Principal | ICD-10-CM

## 2017-10-30 DIAGNOSIS — O34219 Maternal care for unspecified type scar from previous cesarean delivery: Secondary | ICD-10-CM

## 2017-10-30 DIAGNOSIS — O358XX0 Maternal care for other (suspected) fetal abnormality and damage, not applicable or unspecified: Secondary | ICD-10-CM | POA: Insufficient documentation

## 2017-10-30 DIAGNOSIS — O09299 Supervision of pregnancy with other poor reproductive or obstetric history, unspecified trimester: Secondary | ICD-10-CM | POA: Diagnosis not present

## 2017-10-30 NOTE — Progress Notes (Signed)
I have reviewed this chart and agree with the RN assessment and management.    K. Meryl Jerianne Anselmo, M.D. Attending Obstetrician & Gynecologist, [facility], [facility]  

## 2017-11-05 ENCOUNTER — Encounter: Payer: Self-pay | Admitting: Obstetrics and Gynecology

## 2017-11-05 ENCOUNTER — Ambulatory Visit (INDEPENDENT_AMBULATORY_CARE_PROVIDER_SITE_OTHER): Payer: Medicaid Other | Admitting: Obstetrics and Gynecology

## 2017-11-05 VITALS — BP 124/77 | HR 90 | Wt 193.4 lb

## 2017-11-05 DIAGNOSIS — R8271 Bacteriuria: Secondary | ICD-10-CM

## 2017-11-05 DIAGNOSIS — Z98891 History of uterine scar from previous surgery: Secondary | ICD-10-CM

## 2017-11-05 DIAGNOSIS — O09219 Supervision of pregnancy with history of pre-term labor, unspecified trimester: Secondary | ICD-10-CM

## 2017-11-05 DIAGNOSIS — O358XX0 Maternal care for other (suspected) fetal abnormality and damage, not applicable or unspecified: Secondary | ICD-10-CM

## 2017-11-05 DIAGNOSIS — O35EXX0 Maternal care for other (suspected) fetal abnormality and damage, fetal genitourinary anomalies, not applicable or unspecified: Secondary | ICD-10-CM

## 2017-11-05 DIAGNOSIS — O09899 Supervision of other high risk pregnancies, unspecified trimester: Secondary | ICD-10-CM

## 2017-11-05 DIAGNOSIS — O0990 Supervision of high risk pregnancy, unspecified, unspecified trimester: Secondary | ICD-10-CM

## 2017-11-05 DIAGNOSIS — O0992 Supervision of high risk pregnancy, unspecified, second trimester: Secondary | ICD-10-CM

## 2017-11-05 DIAGNOSIS — O09212 Supervision of pregnancy with history of pre-term labor, second trimester: Secondary | ICD-10-CM

## 2017-11-05 NOTE — Patient Instructions (Signed)
Contraception Choices Contraception (birth control) is the use of any methods or devices to prevent pregnancy. Below are some methods to help avoid pregnancy. Hormonal methods  Contraceptive implant. This is a thin, plastic tube containing progesterone hormone. It does not contain estrogen hormone. Your health care provider inserts the tube in the inner part of the upper arm. The tube can remain in place for up to 3 years. After 3 years, the implant must be removed. The implant prevents the ovaries from releasing an egg (ovulation), thickens the cervical mucus to prevent sperm from entering the uterus, and thins the lining of the inside of the uterus.  Progesterone-only injections. These injections are given every 3 months by your health care provider to prevent pregnancy. This synthetic progesterone hormone stops the ovaries from releasing eggs. It also thickens cervical mucus and changes the uterine lining. This makes it harder for sperm to survive in the uterus.  Birth control pills. These pills contain estrogen and progesterone hormone. They work by preventing the ovaries from releasing eggs (ovulation). They also cause the cervical mucus to thicken, preventing the sperm from entering the uterus. Birth control pills are prescribed by a health care provider.Birth control pills can also be used to treat heavy periods.  Minipill. This type of birth control pill contains only the progesterone hormone. They are taken every day of each month and must be prescribed by your health care provider.  Birth control patch. The patch contains hormones similar to those in birth control pills. It must be changed once a week and is prescribed by a health care provider.  Vaginal ring. The ring contains hormones similar to those in birth control pills. It is left in the vagina for 3 weeks, removed for 1 week, and then a new one is put back in place. The patient must be comfortable inserting and removing the ring from  the vagina.A health care provider's prescription is necessary.  Emergency contraception. Emergency contraceptives prevent pregnancy after unprotected sexual intercourse. This pill can be taken right after sex or up to 5 days after unprotected sex. It is most effective the sooner you take the pills after having sexual intercourse. Most emergency contraceptive pills are available without a prescription. Check with your pharmacist. Do not use emergency contraception as your only form of birth control. Barrier methods  Female condom. This is a thin sheath (latex or rubber) that is worn over the penis during sexual intercourse. It can be used with spermicide to increase effectiveness.  Female condom. This is a soft, loose-fitting sheath that is put into the vagina before sexual intercourse.  Diaphragm. This is a soft, latex, dome-shaped barrier that must be fitted by a health care provider. It is inserted into the vagina, along with a spermicidal jelly. It is inserted before intercourse. The diaphragm should be left in the vagina for 6 to 8 hours after intercourse.  Cervical cap. This is a round, soft, latex or plastic cup that fits over the cervix and must be fitted by a health care provider. The cap can be left in place for up to 48 hours after intercourse.  Sponge. This is a soft, circular piece of polyurethane foam. The sponge has spermicide in it. It is inserted into the vagina after wetting it and before sexual intercourse.  Spermicides. These are chemicals that kill or block sperm from entering the cervix and uterus. They come in the form of creams, jellies, suppositories, foam, or tablets. They do not require a prescription. They   are inserted into the vagina with an applicator before having sexual intercourse. The process must be repeated every time you have sexual intercourse. Intrauterine contraception  Intrauterine device (IUD). This is a T-shaped device that is put in a woman's uterus during  a menstrual period to prevent pregnancy. There are 2 types: ? Copper IUD. This type of IUD is wrapped in copper wire and is placed inside the uterus. Copper makes the uterus and fallopian tubes produce a fluid that kills sperm. It can stay in place for 10 years. ? Hormone IUD. This type of IUD contains the hormone progestin (synthetic progesterone). The hormone thickens the cervical mucus and prevents sperm from entering the uterus, and it also thins the uterine lining to prevent implantation of a fertilized egg. The hormone can weaken or kill the sperm that get into the uterus. It can stay in place for 3-5 years, depending on which type of IUD is used. Permanent methods of contraception  Female tubal ligation. This is when the woman's fallopian tubes are surgically sealed, tied, or blocked to prevent the egg from traveling to the uterus.  Hysteroscopic sterilization. This involves placing a small coil or insert into each fallopian tube. Your doctor uses a technique called hysteroscopy to do the procedure. The device causes scar tissue to form. This results in permanent blockage of the fallopian tubes, so the sperm cannot fertilize the egg. It takes about 3 months after the procedure for the tubes to become blocked. You must use another form of birth control for these 3 months.  Female sterilization. This is when the female has the tubes that carry sperm tied off (vasectomy).This blocks sperm from entering the vagina during sexual intercourse. After the procedure, the man can still ejaculate fluid (semen). Natural planning methods  Natural family planning. This is not having sexual intercourse or using a barrier method (condom, diaphragm, cervical cap) on days the woman could become pregnant.  Calendar method. This is keeping track of the length of each menstrual cycle and identifying when you are fertile.  Ovulation method. This is avoiding sexual intercourse during ovulation.  Symptothermal method.  This is avoiding sexual intercourse during ovulation, using a thermometer and ovulation symptoms.  Post-ovulation method. This is timing sexual intercourse after you have ovulated. Regardless of which type or method of contraception you choose, it is important that you use condoms to protect against the transmission of sexually transmitted infections (STIs). Talk with your health care provider about which form of contraception is most appropriate for you. This information is not intended to replace advice given to you by your health care provider. Make sure you discuss any questions you have with your health care provider. Document Released: 11/20/2005 Document Revised: 04/27/2016 Document Reviewed: 05/15/2013 Elsevier Interactive Patient Education  2017 Elsevier Inc.  

## 2017-11-05 NOTE — Progress Notes (Signed)
   PRENATAL VISIT NOTE  Subjective:  Stacy Christian is a 26 y.o. G3P0110 at 677w2d being seen today for ongoing prenatal care.  She is currently monitored for the following issues for this high-risk pregnancy and has Supervision of high risk pregnancy, antepartum; History of premature delivery, currently pregnant; History of classical cesarean section; GBS bacteriuria; and Fetal renal anomaly, single gestation on their problem list.  Patient reports no complaints.  Contractions: Not present. Vag. Bleeding: None.  Movement: Present. Denies leaking of fluid.   The following portions of the patient's history were reviewed and updated as appropriate: allergies, current medications, past family history, past medical history, past social history, past surgical history and problem list. Problem list updated.  Objective:   Vitals:   11/05/17 1110  BP: 124/77  Pulse: 90  Weight: 193 lb 6.4 oz (87.7 kg)    Fetal Status: Fetal Heart Rate (bpm): 142   Movement: Present     General:  Alert, oriented and cooperative. Patient is in no acute distress.  Skin: Skin is warm and dry. No rash noted.   Cardiovascular: Normal heart rate noted  Respiratory: Normal respiratory effort, no problems with respiration noted  Abdomen: Soft, gravid, appropriate for gestational age.  Pain/Pressure: Absent     Pelvic: Cervical exam deferred        Extremities: Normal range of motion.  Edema: Mild pitting, slight indentation  Mental Status:  Normal mood and affect. Normal behavior. Normal judgment and thought content.   Assessment and Plan:  Pregnancy: G3P0110 at 117w2d  1. Fetal renal anomaly, single gestation F/u US scheduled for 11/14/17  2. Supervision of high risk pregnancy, antepartum  3. History of premature delivery, currently pregnant 17P, not in office as it was sent to wrong location, staff is getting it brought here for patient to receive injection, patient aware to call us if she does not hear  from us in next 2 days, patient will return as soon as it comes in CL next 12/11  4. History of classical cesarean section RCS 37 weeks  5. GBS bacteriuria   Preterm labor symptoms and general obstetric precautions including but not limited to vaginal bleeding, contractions, leaking of fluid and fetal movement were reviewed in detail with the patient. Please refer to After Visit Summary for other counseling recommendations.  Return in about 3 weeks (around 11/26/2017) for OB visit (MD).   Conan BowensKelly M Raequan Vanschaick, MD

## 2017-11-05 NOTE — Progress Notes (Addendum)
Addendum:  12:54  17p not availble -Informed patient we will to get new shipment- call us in 2 days to see if available if we have not called her. I  called makena to find out which pharmacy is dispensing- called [facility] at [phone] and med was sent to [facility]. Called [facility] and will have provider bring it over tomorrow.

## 2017-11-06 ENCOUNTER — Telehealth: Payer: Self-pay | Admitting: *Deleted

## 2017-11-06 NOTE — Telephone Encounter (Signed)
Received 17p . Called Stacy Christian and notified her we received her 17p and asked if she can come in today or at the latest tomorrow . She agreed to tomorrow 11/07/17 10:00.

## 2017-11-07 ENCOUNTER — Ambulatory Visit (INDEPENDENT_AMBULATORY_CARE_PROVIDER_SITE_OTHER): Payer: Medicaid Other | Admitting: General Practice

## 2017-11-07 DIAGNOSIS — O09899 Supervision of other high risk pregnancies, unspecified trimester: Secondary | ICD-10-CM

## 2017-11-07 DIAGNOSIS — O09219 Supervision of pregnancy with history of pre-term labor, unspecified trimester: Secondary | ICD-10-CM | POA: Diagnosis present

## 2017-11-08 NOTE — Progress Notes (Signed)
Patient for 17P injection.   I have reviewed this chart and agree with the RN/CMA assessment and management.    Baldemar LenisK. Meryl Hadeel Hillebrand, M.D. Attending Obstetrician & Gynecologist, [facility], [facility]

## 2017-11-12 ENCOUNTER — Encounter (HOSPITAL_COMMUNITY): Payer: Self-pay

## 2017-11-13 ENCOUNTER — Ambulatory Visit (HOSPITAL_COMMUNITY): Admission: RE | Admit: 2017-11-13 | Payer: Medicaid Other | Source: Ambulatory Visit

## 2017-11-14 ENCOUNTER — Ambulatory Visit (HOSPITAL_COMMUNITY): Payer: Medicaid Other

## 2017-11-14 ENCOUNTER — Ambulatory Visit (HOSPITAL_COMMUNITY)
Admission: RE | Admit: 2017-11-14 | Discharge: 2017-11-14 | Disposition: A | Discharge status: Home / Self Care | Payer: Medicaid Other | Source: Ambulatory Visit | Attending: Advanced Practice Midwife | Admitting: Advanced Practice Midwife

## 2017-11-14 ENCOUNTER — Other Ambulatory Visit (HOSPITAL_COMMUNITY): Payer: Self-pay | Admitting: Obstetrics and Gynecology

## 2017-11-14 ENCOUNTER — Encounter (HOSPITAL_COMMUNITY): Payer: Self-pay

## 2017-11-14 ENCOUNTER — Ambulatory Visit (INDEPENDENT_AMBULATORY_CARE_PROVIDER_SITE_OTHER): Payer: Medicaid Other | Admitting: *Deleted

## 2017-11-14 VITALS — BP 127/85 | HR 66

## 2017-11-14 DIAGNOSIS — O09212 Supervision of pregnancy with history of pre-term labor, second trimester: Secondary | ICD-10-CM | POA: Diagnosis present

## 2017-11-14 DIAGNOSIS — O09899 Supervision of other high risk pregnancies, unspecified trimester: Secondary | ICD-10-CM

## 2017-11-14 DIAGNOSIS — O09219 Supervision of pregnancy with history of pre-term labor, unspecified trimester: Principal | ICD-10-CM

## 2017-11-14 DIAGNOSIS — Z3689 Encounter for other specified antenatal screening: Secondary | ICD-10-CM | POA: Insufficient documentation

## 2017-11-14 DIAGNOSIS — Z362 Encounter for other antenatal screening follow-up: Secondary | ICD-10-CM

## 2017-11-14 DIAGNOSIS — Z3686 Encounter for antenatal screening for cervical length: Secondary | ICD-10-CM | POA: Insufficient documentation

## 2017-11-14 DIAGNOSIS — Z3A24 24 weeks gestation of pregnancy: Secondary | ICD-10-CM | POA: Diagnosis not present

## 2017-11-14 DIAGNOSIS — O34219 Maternal care for unspecified type scar from previous cesarean delivery: Secondary | ICD-10-CM

## 2017-11-14 NOTE — Progress Notes (Signed)
Patient presents to clinic for 17-p injection. Tolerated well.

## 2017-11-14 NOTE — Addendum Note (Signed)
Encounter addended by: Vivien RotaSmall, Keymon Mcelroy H, RT on: 11/14/2017 3:58 PM  Actions taken: Imaging Exam ended

## 2017-11-15 ENCOUNTER — Other Ambulatory Visit (HOSPITAL_COMMUNITY): Payer: Self-pay | Admitting: *Deleted

## 2017-11-15 DIAGNOSIS — O365920 Maternal care for other known or suspected poor fetal growth, second trimester, not applicable or unspecified: Secondary | ICD-10-CM

## 2017-11-15 NOTE — Progress Notes (Signed)
I was present in clinic at time of visit. Agree with RN's documentation.  Raynelle FanningJulie P. Rayan Dyal, MD

## 2017-11-19 ENCOUNTER — Encounter (HOSPITAL_COMMUNITY): Payer: Self-pay

## 2017-11-21 ENCOUNTER — Ambulatory Visit: Payer: Medicaid Other

## 2017-11-21 ENCOUNTER — Ambulatory Visit (HOSPITAL_COMMUNITY)
Admission: RE | Admit: 2017-11-21 | Discharge: 2017-11-21 | Disposition: A | Discharge status: Home / Self Care | Payer: Medicaid Other | Source: Ambulatory Visit | Attending: Advanced Practice Midwife | Admitting: Advanced Practice Midwife

## 2017-11-21 ENCOUNTER — Ambulatory Visit (INDEPENDENT_AMBULATORY_CARE_PROVIDER_SITE_OTHER): Payer: Medicaid Other | Admitting: General Practice

## 2017-11-21 ENCOUNTER — Other Ambulatory Visit (HOSPITAL_COMMUNITY): Payer: Self-pay | Admitting: Obstetrics and Gynecology

## 2017-11-21 ENCOUNTER — Encounter (HOSPITAL_COMMUNITY): Payer: Self-pay

## 2017-11-21 VITALS — BP 131/85 | HR 63 | Ht 66.0 in | Wt 199.0 lb

## 2017-11-21 DIAGNOSIS — O365920 Maternal care for other known or suspected poor fetal growth, second trimester, not applicable or unspecified: Secondary | ICD-10-CM

## 2017-11-21 DIAGNOSIS — Z98891 History of uterine scar from previous surgery: Secondary | ICD-10-CM | POA: Insufficient documentation

## 2017-11-21 DIAGNOSIS — Z8751 Personal history of pre-term labor: Secondary | ICD-10-CM

## 2017-11-21 DIAGNOSIS — Z8759 Personal history of other complications of pregnancy, childbirth and the puerperium: Secondary | ICD-10-CM

## 2017-11-21 DIAGNOSIS — O09212 Supervision of pregnancy with history of pre-term labor, second trimester: Secondary | ICD-10-CM | POA: Diagnosis not present

## 2017-11-21 DIAGNOSIS — Q625 Duplication of ureter: Secondary | ICD-10-CM

## 2017-11-21 DIAGNOSIS — O09899 Supervision of other high risk pregnancies, unspecified trimester: Secondary | ICD-10-CM

## 2017-11-21 DIAGNOSIS — Z3A25 25 weeks gestation of pregnancy: Secondary | ICD-10-CM | POA: Diagnosis not present

## 2017-11-21 DIAGNOSIS — O09219 Supervision of pregnancy with history of pre-term labor, unspecified trimester: Principal | ICD-10-CM

## 2017-11-21 NOTE — Addendum Note (Signed)
Encounter addended by: Lenise ArenaBazemore, Teddy Rebstock, RDMS on: 11/21/2017 1:52 PM  Actions taken: Imaging Exam ended

## 2017-11-21 NOTE — Progress Notes (Signed)
17p given 

## 2017-11-24 NOTE — Progress Notes (Signed)
Noted 17P injection

## 2017-11-28 ENCOUNTER — Ambulatory Visit (HOSPITAL_COMMUNITY)
Admission: RE | Admit: 2017-11-28 | Discharge: 2017-11-28 | Disposition: A | Discharge status: Home / Self Care | Payer: Medicaid Other | Source: Ambulatory Visit | Attending: Advanced Practice Midwife | Admitting: Advanced Practice Midwife

## 2017-11-28 ENCOUNTER — Encounter (HOSPITAL_COMMUNITY): Payer: Self-pay

## 2017-11-28 ENCOUNTER — Other Ambulatory Visit: Payer: Self-pay

## 2017-11-28 ENCOUNTER — Encounter: Payer: Medicaid Other | Admitting: Obstetrics and Gynecology

## 2017-11-28 ENCOUNTER — Inpatient Hospital Stay (HOSPITAL_COMMUNITY)
Admission: AD | Admit: 2017-11-28 | Discharge: 2017-11-28 | Disposition: A | Discharge status: Home / Self Care | Payer: Medicaid Other | Source: Ambulatory Visit | Attending: Family Medicine | Admitting: Family Medicine

## 2017-11-28 ENCOUNTER — Other Ambulatory Visit (HOSPITAL_COMMUNITY): Payer: Self-pay | Admitting: Obstetrics and Gynecology

## 2017-11-28 ENCOUNTER — Ambulatory Visit (INDEPENDENT_AMBULATORY_CARE_PROVIDER_SITE_OTHER): Payer: Medicaid Other | Admitting: *Deleted

## 2017-11-28 VITALS — BP 125/81 | HR 89

## 2017-11-28 DIAGNOSIS — O34219 Maternal care for unspecified type scar from previous cesarean delivery: Secondary | ICD-10-CM | POA: Diagnosis not present

## 2017-11-28 DIAGNOSIS — O365920 Maternal care for other known or suspected poor fetal growth, second trimester, not applicable or unspecified: Secondary | ICD-10-CM

## 2017-11-28 DIAGNOSIS — Z3A26 26 weeks gestation of pregnancy: Secondary | ICD-10-CM | POA: Diagnosis not present

## 2017-11-28 DIAGNOSIS — O132 Gestational [pregnancy-induced] hypertension without significant proteinuria, second trimester: Secondary | ICD-10-CM

## 2017-11-28 DIAGNOSIS — O365929 Maternal care for other known or suspected poor fetal growth, second trimester, other fetus: Secondary | ICD-10-CM | POA: Diagnosis present

## 2017-11-28 DIAGNOSIS — R030 Elevated blood-pressure reading, without diagnosis of hypertension: Secondary | ICD-10-CM

## 2017-11-28 DIAGNOSIS — O09212 Supervision of pregnancy with history of pre-term labor, second trimester: Secondary | ICD-10-CM | POA: Diagnosis not present

## 2017-11-28 DIAGNOSIS — O09219 Supervision of pregnancy with history of pre-term labor, unspecified trimester: Secondary | ICD-10-CM | POA: Diagnosis not present

## 2017-11-28 DIAGNOSIS — O09899 Supervision of other high risk pregnancies, unspecified trimester: Secondary | ICD-10-CM

## 2017-11-28 DIAGNOSIS — O358XX0 Maternal care for other (suspected) fetal abnormality and damage, not applicable or unspecified: Secondary | ICD-10-CM | POA: Diagnosis not present

## 2017-11-28 DIAGNOSIS — O09892 Supervision of other high risk pregnancies, second trimester: Secondary | ICD-10-CM | POA: Diagnosis not present

## 2017-11-28 DIAGNOSIS — O283 Abnormal ultrasonic finding on antenatal screening of mother: Secondary | ICD-10-CM

## 2017-11-28 LAB — COMPREHENSIVE METABOLIC PANEL
ALBUMIN: 2.9 g/dL — AB (ref 3.5–5.0)
ALK PHOS: 105 U/L (ref 38–126)
ALT: 15 U/L (ref 14–54)
AST: 23 U/L (ref 15–41)
Anion gap: 6 (ref 5–15)
BUN: 6 mg/dL (ref 6–20)
CALCIUM: 8.4 mg/dL — AB (ref 8.9–10.3)
CO2: 22 mmol/L (ref 22–32)
CREATININE: 0.54 mg/dL (ref 0.44–1.00)
Chloride: 106 mmol/L (ref 101–111)
GFR calc Af Amer: >60 mL/min (ref >60)
GFR calc non Af Amer: >60 mL/min (ref >60)
GLUCOSE: 73 mg/dL (ref 65–99)
Potassium: 3.9 mmol/L (ref 3.5–5.1)
SODIUM: 134 mmol/L — AB (ref 135–145)
Total Bilirubin: 0.2 mg/dL — ABNORMAL LOW (ref 0.3–1.2)
Total Protein: 7 g/dL (ref 6.5–8.1)

## 2017-11-28 LAB — CBC
HCT: 36.1 % (ref 36.0–46.0)
Hemoglobin: 12.5 g/dL (ref 12.0–15.0)
MCH: 30.3 pg (ref 26.0–34.0)
MCHC: 34.6 g/dL (ref 30.0–36.0)
MCV: 87.4 fL (ref 78.0–100.0)
PLATELETS: 198 10*3/uL (ref 150–400)
RBC: 4.13 MIL/uL (ref 3.87–5.11)
RDW: 13.3 % (ref 11.5–15.5)
WBC: 8.8 10*3/uL (ref 4.0–10.5)

## 2017-11-28 LAB — URINALYSIS, ROUTINE W REFLEX MICROSCOPIC
BILIRUBIN URINE: NEGATIVE
GLUCOSE, UA: NEGATIVE mg/dL
HGB URINE DIPSTICK: NEGATIVE
Ketones, ur: NEGATIVE mg/dL
LEUKOCYTES UA: NEGATIVE
NITRITE: NEGATIVE
PH: 7 (ref 5.0–8.0)
Protein, ur: NEGATIVE mg/dL
SPECIFIC GRAVITY, URINE: 1.009 (ref 1.005–1.030)

## 2017-11-28 LAB — PROTEIN / CREATININE RATIO, URINE
Creatinine, Urine: 55 mg/dL
Total Protein, Urine: <6 mg/dL

## 2017-11-28 MED ORDER — BETAMETHASONE SOD PHOS & ACET 6 (3-3) MG/ML IJ SUSP
12.0000 mg | Freq: Once | INTRAMUSCULAR | Status: AC
  Administered 2017-11-28: 12 mg via INTRAMUSCULAR
  Filled 2017-11-28: qty 2

## 2017-11-28 NOTE — Progress Notes (Signed)
D/c instructions discussed to include HTN with pregnancy and signs to come back to [facility] for.  Pt aware to return tomorrow for 2nd betamethasone shot & [facility]

## 2017-11-28 NOTE — Progress Notes (Signed)
Report called to Colin MuldersBrianna, RN, Press photographerCharge Nurse on OB HR. Given room 306 for patient.

## 2017-11-28 NOTE — ED Notes (Signed)
Report called to J. Rana SnareLowe, RN.  Pt to MAU for evaluation.

## 2017-11-28 NOTE — MAU Note (Signed)
Pt sent from [facility]FM with elevated BP, hx of IUGR, also dx'd with absent end diastolic flow on U/S today.  Denies HA or visual changes, states her fingertips feel numb.  Denies contractions, bleeding or LOF.

## 2017-11-28 NOTE — MAU Provider Note (Signed)
Chief Complaint:  Hypertension   First Provider Initiated Contact with Patient 11/28/17 1336     HPI  HPI: Stacy Christian is a 26 y.o. G3P0110 at 56w4dwho presents to maternity admissions from MFM for BP evaluation. 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 Had elevated BP while at MFM.  Denies history of hypertension. Denies headache, visual disturbance, or epigastric pain. Denies abdominal pain or vaginal bleeding. Positive fetal movement.   Past Medical History: Past Medical History:  Diagnosis Date  . Asthma     Past obstetric history: OB History  Gravida Para Term Preterm AB Living  3 1   1 1  0  SAB TAB Ectopic Multiple Live Births  1       1    # Outcome Date GA Lbr Len/2nd Weight Sex Delivery Anes PTL Lv  3 Current           2 SAB 12/23/14 [redacted]w[redacted]d         1 Preterm 06/26/07 [redacted]w[redacted]d   F   Y ND      Past Surgical History: Past Surgical History:  Procedure Laterality Date  . CESAREAN SECTION    . TONSILLECTOMY      Family History: Family History  Problem Relation Age of Onset  . Diabetes Mother   . Hypertension Mother   . Heart disease Mother   . Diabetes Father   . Hypertension Father   . Heart disease Father   . Diabetes Maternal Grandmother   . Cancer Maternal Grandfather   . Heart disease Paternal Grandmother   . Heart disease Paternal Grandfather     Social History: Social History   Tobacco Use  . Smoking status: Former Smoker    Packs/day: 0.15    Last attempt to quit: 08/04/2017    Years since quitting: 0.3  . Smokeless tobacco: Never Used  Substance Use Topics  . Alcohol use: No  . Drug use: Yes    Frequency: 3.0 times per week    Types: Marijuana    Comment: none with pregnancy    Allergies: No Known Allergies  Meds:  No medications prior to admission.    I have reviewed patient's Past Medical Hx, Surgical Hx, Family Hx, Social Hx, medications and allergies.   ROS:  Review of  Systems  Constitutional: Negative.   Eyes: Negative for visual disturbance.  Gastrointestinal: Negative.   Genitourinary: Negative.   Neurological: Negative for headaches.   Other systems negative  Physical Exam   Patient Vitals for the past 24 hrs:  BP Temp Temp src Pulse Resp  11/28/17 1537 131/85 98.7 F (37.1 C) Oral 85 20  11/28/17 1446 137/90 - - 78 -  11/28/17 1431 133/85 - - 81 -  11/28/17 1416 137/86 - - 72 -  11/28/17 1401 133/90 - - 73 -  11/28/17 1346 134/87 - - 71 -  11/28/17 1331 128/72 - - 76 -  11/28/17 1318 (!) 136/95 - - 90 -  11/28/17 1244 126/86 - - 77 18   Constitutional: Well-developed, well-nourished female in no acute distress.  Cardiovascular: normal rate and rhythm Respiratory: normal effort, clear to auscultation bilaterally GI: Abd soft, non-tender, gravid appropriate for gestational age.   No rebound or guarding. MS: Extremities nontender, no edema, normal ROM Neurologic: Alert and oriented x 4. Bilateral patellar DTR 2+, no clonus     FHT:  Baseline 150 , moderate variability, accelerations present, no decelerations Contractions:  Labs: Results for orders placed or performed during the hospital encounter of 11/28/17 (from the past 24 hour(s))  Urinalysis, Routine w reflex microscopic     Status: Abnormal   Collection Time: 11/28/17 12:48 PM  Result Value Ref Range   Color, Urine YELLOW YELLOW   APPearance CLEAR CLEAR   Specific Gravity, Urine 1.009 1.005 - 1.030   pH 7.0 5.0 - 8.0   Glucose, UA NEGATIVE NEGATIVE mg/dL   Hgb urine dipstick NEGATIVE NEGATIVE   Bilirubin Urine NEGATIVE NEGATIVE   Ketones, ur NEGATIVE NEGATIVE mg/dL   Protein, ur NEGATIVE NEGATIVE mg/dL   Nitrite NEGATIVE NEGATIVE   Leukocytes, UA NEGATIVE NEGATIVE   RBC / HPF 0-5 0 - 5 RBC/hpf   WBC, UA 0-5 0 - 5 WBC/hpf   Bacteria, UA FEW (A) NONE SEEN   Squamous Epithelial / LPF 0-5 (A) NONE SEEN   Mucus PRESENT   Protein / creatinine ratio, urine     Status:  None   Collection Time: 11/28/17 12:48 PM  Result Value Ref Range   Creatinine, Urine 55.00 mg/dL   Total Protein, Urine <6.0 mg/dL   Protein Creatinine Ratio        0.00 - 0.15 mg/mg[Cre]  CBC     Status: None   Collection Time: 11/28/17  2:24 PM  Result Value Ref Range   WBC 8.8 4.0 - 10.5 K/uL   RBC 4.13 3.87 - 5.11 MIL/uL   Hemoglobin 12.5 12.0 - 15.0 g/dL   HCT 59.536.1 63.836.0 - 75.646.0 %   MCV 87.4 78.0 - 100.0 fL   MCH 30.3 26.0 - 34.0 pg   MCHC 34.6 30.0 - 36.0 g/dL   RDW 43.313.3 29.511.5 - 18.815.5 %   Platelets 198 150 - 400 K/uL  Comprehensive metabolic panel     Status: Abnormal   Collection Time: 11/28/17  2:24 PM  Result Value Ref Range   Sodium 134 (L) 135 - 145 mmol/L   Potassium 3.9 3.5 - 5.1 mmol/L   Chloride 106 101 - 111 mmol/L   CO2 22 22 - 32 mmol/L   Glucose, Bld 73 65 - 99 mg/dL   BUN 6 6 - 20 mg/dL   Creatinine, Ser 4.160.54 0.44 - 1.00 mg/dL   Calcium 8.4 (L) 8.9 - 10.3 mg/dL   Total Protein 7.0 6.5 - 8.1 g/dL   Albumin 2.9 (L) 3.5 - 5.0 g/dL   AST 23 15 - 41 U/L   ALT 15 14 - 54 U/L   Alkaline Phosphatase 105 38 - 126 U/L   Total Bilirubin 0.2 (L) 0.3 - 1.2 mg/dL   GFR calc non Af Amer >60 >60 mL/min   GFR calc Af Amer >60 >60 mL/min   Anion gap 6 5 - 15   A/Positive/-- (09/04 1126)  Imaging:  [place] Mfm Ua Cord Doppler  Result Date: 11/28/2017 ----------------------------------------------------------------------  OBSTETRICS REPORT                      (Signed Final 11/28/2017 01:32 pm) ---------------------------------------------------------------------- Patient Info  ID #:       [id number]                          D.O.B.:  05-11-1991 (26 yrs)  Name:       Stacy Christian              Visit Date: 11/28/2017 10:35 am ---------------------------------------------------------------------- Performed By  Performed By:  Marcellina Millin          Ref. Address:     [facility]                                                             OB/Gyn Clinic                                                              [address]                                                             [place], [place]                                                             [address]  Attending:        Particia Nearing MD       Location:         [facility]  Referred By:      [facility] for                    [facility]                    Healthcare ---------------------------------------------------------------------- Orders   #  Description                                 Code   1  [place] MFM UA CORD DOPPLER                      [phone]  ----------------------------------------------------------------------   #  Ordered By               Order #        Accession #    Episode #   1  MARK Ezzard Standing              [id number]      [id number]     [id number]  ---------------------------------------------------------------------- Indications   [redacted] weeks gestation of pregnancy  Z3A.26   Fetal renal anomaly, unspecified fetus;        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   History of cesarean delivery, currently        O34.219   pregnant; classical   Maternal care for known or suspected poor      O36.5920   fetal growth, second trimester, not applicable   or unspecified; AC at the 3rd %tile   Poor obstetric history: Previous preterm       O09.219   delivery, antepartum; abruption at 24 weeks   with neonatal demise; 17P  ---------------------------------------------------------------------- OB History  Blood Type:   A+       Height:  5'6"   Weight (lb):  153       BMI:  24.69  Gravidity:    3         Term:   0        Prem:   1        SAB:   1  Living:       0 ---------------------------------------------------------------------- Fetal Evaluation  Num Of Fetuses:     1  Fetal Heart         148  Rate(bpm):  Cardiac Activity:   Observed  Presentation:       Cephalic  Amniotic Fluid  AFI  FV:      Subjectively within normal limits                              Largest Pocket(cm)                              3.8 ---------------------------------------------------------------------- Gestational Age  LMP:           32w 4d        Date:  04/14/17                 EDD:   01/19/18  Best:          Altamese Cabal 4d     Det. By:  Marcella Dubs         EDD:   03/02/18                                      (07/18/17) ---------------------------------------------------------------------- Anatomy  Kidneys:               Appear normal ---------------------------------------------------------------------- Doppler - Fetal Vessels  Umbilical Artery   S/D     %tile                                     PSV    ADFV                                                   (cm/s)  10.6    > 97.5                                    36.16     Yes  4 ---------------------------------------------------------------------- Impression  SIUP at 26+4 weeks  Cephalic presentation  Kidneys: ? appeared normal today  Normal amniotic fluid volume  UA dopplers: absent end diastolic flow  Good fetal movement and breathing movement noted ---------------------------------------------------------------------- Recommendations  Admission for course of BMZ and evaluation of elevated BPs ----------------------------------------------------------------------                 Particia NearingMartha Decker, MD Electronically Signed Final Report   11/28/2017 01:32 pm ----------------------------------------------------------------------     MAU Course/MDM: Fetal tracing appropriate for gestation Some elevated BPs, none severe range PreE labs normal BMZ given C/w Dr. Debroah LoopArnold who c/w MFM. Will complete antenatal steroids & recheck BP tomorrow in office. Pt to return to MFM on Friday for f/u ultrasound    Assessment: 1. Elevated BP without diagnosis of hypertension   2. [redacted] weeks gestation of pregnancy   3. Abnormal fetal ultrasound     Plan: Discharge home PTL &  preeclampsia precuations Follow up in Office for prenatal visits and recheck Follow-up Information    [facility] Go on 11/30/2017.   Specialty:  Maternal and Fetal Medicine Contact information: [address] [id number] [place] [phone]       [facility] Go on 11/29/2017.   Specialty:  Obstetrics and Gynecology Why:  at 11 am Contact information: [address] [place] [phone]          Pt stable at time of discharge.  Judeth HornErin Jovontae Banko, FNP 11/28/2017 5:09 PM

## 2017-11-28 NOTE — Progress Notes (Signed)
17 P given by RN.  Agree with nursing staff's documentation of this patient's clinic encounter.  Jaynie CollinsUgonna Nocholas Damaso, MD 11/28/2017 4:22 PM

## 2017-11-28 NOTE — Discharge Instructions (Signed)
Hypertension During Pregnancy °Hypertension, commonly called high blood pressure, is when the force of blood pumping through your arteries is too strong. Arteries are blood vessels that carry blood from the heart throughout the body. Hypertension during pregnancy can cause problems for you and your baby. Your baby may be born early (prematurely) or may not weigh as much as he or she should at birth. Very bad cases of hypertension during pregnancy can be life-threatening. °Different types of hypertension can occur during pregnancy. These include: °· Chronic hypertension. This happens when: °? You have hypertension before pregnancy and it continues during pregnancy. °? You develop hypertension before you are [redacted] weeks pregnant, and it continues during pregnancy. °· Gestational hypertension. This is hypertension that develops after the 20th week of pregnancy. °· Preeclampsia, also called toxemia of pregnancy. This is a very serious type of hypertension that develops only during pregnancy. It affects the whole body, and it can be very dangerous for you and your baby. ° °Gestational hypertension and preeclampsia usually go away within 6 weeks after your baby is born. Women who have hypertension during pregnancy have a greater chance of developing hypertension later in life or during future pregnancies. °What are the causes? °The exact cause of hypertension is not known. °What increases the risk? °There are certain factors that make it more likely for you to develop hypertension during pregnancy. These include: °· Having hypertension during a previous pregnancy or prior to pregnancy. °· Being overweight. °· Being older than age 40. °· Being pregnant for the first time or being pregnant with more than one baby. °· Becoming pregnant using fertilization methods such as IVF (in vitro fertilization). °· Having diabetes, kidney problems, or systemic lupus erythematosus. °· Having a family history of hypertension. ° °What are the  signs or symptoms? °Chronic hypertension and gestational hypertension rarely cause symptoms. Preeclampsia causes symptoms, which may include: °· Increased protein in your urine. Your health care provider will check for this at every visit before you give birth (prenatal visit). °· Severe headaches. °· Sudden weight gain. °· Swelling of the hands, face, legs, and feet. °· Nausea and vomiting. °· Vision problems, such as blurred or double vision. °· Numbness in the face, arms, legs, and feet. °· Dizziness. °· Slurred speech. °· Sensitivity to bright lights. °· Abdominal pain. °· Convulsions. ° °How is this diagnosed? °You may be diagnosed with hypertension during a routine prenatal exam. At each prenatal visit, you may: °· Have a urine test to check for high amounts of protein in your urine. °· Have your blood pressure checked. A blood pressure reading is recorded as two numbers, such as "120 over 80" (or 120/80). The first ("top") number is called the systolic pressure. It is a measure of the pressure in your arteries when your heart beats. The second ("bottom") number is called the diastolic pressure. It is a measure of the pressure in your arteries as your heart relaxes between beats. Blood pressure is measured in a unit called mm Hg. A normal blood pressure reading is: °? Systolic: below 120. °? Diastolic: below 80. ° °The type of hypertension that you are diagnosed with depends on your test results and when your symptoms developed. °· Chronic hypertension is usually diagnosed before 20 weeks of pregnancy. °· Gestational hypertension is usually diagnosed after 20 weeks of pregnancy. °· Hypertension with high amounts of protein in the urine is diagnosed as preeclampsia. °· Blood pressure measurements that stay above 160 systolic, or above 110 diastolic, are   signs of severe preeclampsia. ° °How is this treated? °Treatment for hypertension during pregnancy varies depending on the type of hypertension you have and how  serious it is. °· If you take medicines called ACE inhibitors to treat chronic hypertension, you may need to switch medicines. ACE inhibitors should not be taken during pregnancy. °· If you have gestational hypertension, you may need to take blood pressure medicine. °· If you are at risk for preeclampsia, your health care provider may recommend that you take a low-dose aspirin every day to prevent high blood pressure during your pregnancy. °· If you have severe preeclampsia, you may need to be hospitalized so you and your baby can be monitored closely. You may also need to take medicine (magnesium sulfate) to prevent seizures and to lower blood pressure. This medicine may be given as an injection or through an IV tube. °· In some cases, if your condition gets worse, you may need to deliver your baby early. ° °Follow these instructions at home: °Eating and drinking °· Drink enough fluid to keep your urine clear or pale yellow. °· Eat a healthy diet that is low in salt (sodium). Do not add salt to your food. Check food labels to see how much sodium a food or beverage contains. °Lifestyle °· Do not use any products that contain nicotine or tobacco, such as cigarettes and e-cigarettes. If you need help quitting, ask your health care provider. °· Do not use alcohol. °· Avoid caffeine. °· Avoid stress as much as possible. Rest and get plenty of sleep. °General instructions °· Take over-the-counter and prescription medicines only as told by your health care provider. °· While lying down, lie on your left side. This keeps pressure off your baby. °· While sitting or lying down, raise (elevate) your feet. Try putting some pillows under your lower legs. °· Exercise regularly. Ask your health care provider what kinds of exercise are best for you. °· Keep all prenatal and follow-up visits as told by your health care provider. This is important. °Contact a health care provider if: °· You have symptoms that your health care  provider told you may require more treatment or monitoring, such as: °? Fever. °? Vomiting. °? Headache. °Get help right away if: °· You have severe abdominal pain or vomiting that does not get better with treatment. °· You suddenly develop swelling in your hands, ankles, or face. °· You gain 4 lbs (1.8 kg) or more in 1 week. °· You develop vaginal bleeding, or you have blood in your urine. °· You do not feel your baby moving as much as usual. °· You have blurred or double vision. °· You have muscle twitching or sudden tightening (spasms). °· You have shortness of breath. °· Your lips or fingernails turn blue. °This information is not intended to replace advice given to you by your health care provider. Make sure you discuss any questions you have with your health care provider. °Document Released: 08/08/2011 Document Revised: 06/09/2016 Document Reviewed: 05/05/2016 °Elsevier Interactive Patient Education © 2018 [employer] Inc[employer] ° °

## 2017-11-29 ENCOUNTER — Telehealth: Payer: Self-pay | Admitting: General Practice

## 2017-11-29 ENCOUNTER — Ambulatory Visit (INDEPENDENT_AMBULATORY_CARE_PROVIDER_SITE_OTHER): Payer: Medicaid Other | Admitting: General Practice

## 2017-11-29 VITALS — BP 153/98 | HR 81 | Ht 66.0 in | Wt 205.0 lb

## 2017-11-29 DIAGNOSIS — O09212 Supervision of pregnancy with history of pre-term labor, second trimester: Secondary | ICD-10-CM | POA: Diagnosis present

## 2017-11-29 DIAGNOSIS — O09219 Supervision of pregnancy with history of pre-term labor, unspecified trimester: Principal | ICD-10-CM

## 2017-11-29 DIAGNOSIS — O09899 Supervision of other high risk pregnancies, unspecified trimester: Secondary | ICD-10-CM

## 2017-11-29 MED ORDER — BETAMETHASONE SOD PHOS & ACET 6 (3-3) MG/ML IJ SUSP
12.0000 mg | Freq: Once | INTRAMUSCULAR | Status: AC
  Administered 2017-11-29: 12 mg via INTRAMUSCULAR

## 2017-11-29 NOTE — Progress Notes (Signed)
BMZ second dose given. Spoke to Dr Vergie LivingPickens regarding patient's BPs, who states we can continue to monitor patient for now.  Discussed with patient returning to MAU for HAs, dizziness or blurry vision. Patient verbalized understanding

## 2017-11-29 NOTE — Telephone Encounter (Signed)
Called patient to notify her of follow up OB appointment that is scheduled on 12/03/17 at 10:20am.  Unable to reach via phone and unable to leave message for patient.  Will send Appointment Reminder and try calling her again.

## 2017-11-30 ENCOUNTER — Encounter (HOSPITAL_COMMUNITY): Payer: Self-pay

## 2017-11-30 ENCOUNTER — Inpatient Hospital Stay (HOSPITAL_COMMUNITY)
Admission: AD | Admit: 2017-11-30 | Discharge: 2017-11-30 | Payer: Medicaid Other | Source: Ambulatory Visit | Attending: Obstetrics & Gynecology | Admitting: Obstetrics & Gynecology

## 2017-11-30 ENCOUNTER — Other Ambulatory Visit (HOSPITAL_COMMUNITY): Payer: Self-pay | Admitting: Obstetrics and Gynecology

## 2017-11-30 ENCOUNTER — Encounter (HOSPITAL_COMMUNITY): Payer: Self-pay | Admitting: *Deleted

## 2017-11-30 ENCOUNTER — Ambulatory Visit (HOSPITAL_COMMUNITY)
Admit: 2017-11-30 | Discharge: 2017-11-30 | Disposition: A | Discharge status: Home / Self Care | Payer: Medicaid Other | Attending: Obstetrics & Gynecology | Admitting: Obstetrics & Gynecology

## 2017-11-30 DIAGNOSIS — O358XX0 Maternal care for other (suspected) fetal abnormality and damage, not applicable or unspecified: Secondary | ICD-10-CM | POA: Insufficient documentation

## 2017-11-30 DIAGNOSIS — Z3A26 26 weeks gestation of pregnancy: Secondary | ICD-10-CM | POA: Diagnosis present

## 2017-11-30 DIAGNOSIS — Z87891 Personal history of nicotine dependence: Secondary | ICD-10-CM | POA: Diagnosis not present

## 2017-11-30 DIAGNOSIS — O09892 Supervision of other high risk pregnancies, second trimester: Secondary | ICD-10-CM | POA: Diagnosis not present

## 2017-11-30 DIAGNOSIS — O365929 Maternal care for other known or suspected poor fetal growth, second trimester, other fetus: Secondary | ICD-10-CM

## 2017-11-30 DIAGNOSIS — O99512 Diseases of the respiratory system complicating pregnancy, second trimester: Secondary | ICD-10-CM | POA: Diagnosis not present

## 2017-11-30 DIAGNOSIS — J45909 Unspecified asthma, uncomplicated: Secondary | ICD-10-CM | POA: Insufficient documentation

## 2017-11-30 DIAGNOSIS — O35EXX0 Maternal care for other (suspected) fetal abnormality and damage, fetal genitourinary anomalies, not applicable or unspecified: Secondary | ICD-10-CM

## 2017-11-30 DIAGNOSIS — Q639 Congenital malformation of kidney, unspecified: Secondary | ICD-10-CM | POA: Diagnosis not present

## 2017-11-30 DIAGNOSIS — R8271 Bacteriuria: Secondary | ICD-10-CM

## 2017-11-30 NOTE — MAU Note (Signed)
Pt sent from u/s for EFM and transfer to [facility]

## 2017-11-30 NOTE — MAU Provider Note (Signed)
History     CSN: [id number]  Arrival date and time: 11/30/17 1001   First Provider Initiated Contact with Patient 11/30/17 1105      Chief Complaint  Patient presents with  . Fetal Monitoring   HPI  Ms.  Stacy Christian is a 26 y.o. year old 223P0110 female at 5915w6d weeks gestation who presents to MAU from MFM for CEFM. Her fetus has renal anomaly and was diagnosed with absent end diastolic flow on 40/98/119112/26/2018. Per Dr. Marjo Bickerenney with MFM, the patient needs to be admitted and monitored continuously.  Past Medical History:  Diagnosis Date  . Asthma     Past Surgical History:  Procedure Laterality Date  . CESAREAN SECTION    . TONSILLECTOMY      Family History  Problem Relation Age of Onset  . Diabetes Mother   . Hypertension Mother   . Heart disease Mother   . Diabetes Father   . Hypertension Father   . Heart disease Father   . Diabetes Maternal Grandmother   . Cancer Maternal Grandfather   . Heart disease Paternal Grandmother   . Heart disease Paternal Grandfather     Social History   Tobacco Use  . Smoking status: Former Smoker    Packs/day: 0.15    Last attempt to quit: 08/04/2017    Years since quitting: 0.3  . Smokeless tobacco: Never Used  Substance Use Topics  . Alcohol use: No  . Drug use: Yes    Frequency: 3.0 times per week    Types: Marijuana    Comment: none with pregnancy    Allergies: No Known Allergies  Facility-Administered Medications Prior to Admission  Medication Dose Route Frequency Provider Last Rate Last Dose  . hydroxyprogesterone caproate (MAKENA) 250 mg/mL injection 250 mg  250 mg Intramuscular Weekly Lesly DukesLeggett, Kelly H, MD   250 mg at 11/28/17 1608   Medications Prior to Admission  Medication Sig Dispense Refill Last Dose  . Prenatal Vit-Fe Fumarate-FA (PRENATAL VITAMINS) 28-0.8 MG TABS Take 1 tablet by mouth daily. 30 tablet 6 Taking    Review of Systems  Constitutional: Negative.   HENT: Negative.   Eyes: Negative.    Respiratory: Negative.   Cardiovascular: Negative.   Gastrointestinal: Negative.   Endocrine: Negative.   Genitourinary: Negative.   Musculoskeletal: Negative.   Skin: Negative.   Allergic/Immunologic: Negative.   Neurological: Negative.   Hematological: Negative.   Psychiatric/Behavioral: Negative.    Physical Exam   Blood pressure (!) 138/91, pulse 90, temperature 98.7 F (37.1 C), last menstrual period 04/14/2017, SpO2 98 %.  Physical Exam  Nursing note and vitals reviewed. Constitutional: She is oriented to person, place, and time. She appears well-developed and well-nourished.  HENT:  Head: Normocephalic.  Eyes: Pupils are equal, round, and reactive to light.  Neck: Normal range of motion.  Cardiovascular: Normal rate, regular rhythm and normal heart sounds.  Respiratory: Effort normal and breath sounds normal.  GI: Soft. Bowel sounds are normal.  Genitourinary:  Genitourinary Comments: Pelvic deferred  Musculoskeletal: Normal range of motion.  Neurological: She is alert and oriented to person, place, and time.  Skin: Skin is warm and dry.  Psychiatric: She has a normal mood and affect. Her behavior is normal. Judgment and thought content normal.    MAU Course  Procedures  MDM NST - FHR: 145 bpm / moderate variability / accels present / decels absent / TOCO: none  *Consult with Dr. Erin FullingHarraway-Smith - orders received [facility];  no need to have CEFM  *Discussed with Dr. Barbee CoughAndrew Bognanno @ 1145 - notified of patient's status and Dr. Reeves Forthenney's recommendation for admission, notified that [facility] and [facility].  Dr. Erin FullingHarraway-Smith discussed patient and necessity for transport with Dr. Drue FlirtBognanno as well.  Assessment and Plan  Fetal renal anomaly, single gestation - [facility]    Per Beagley, MSN, CNM 11/30/2017, 11:05 AM

## 2017-12-03 ENCOUNTER — Encounter: Payer: Medicaid Other | Admitting: Obstetrics and Gynecology

## 2017-12-03 MED ORDER — ASPIRIN EC 81 MG PO TBEC
81.00 | DELAYED_RELEASE_TABLET | ORAL | Status: DC
Start: 2017-12-06 — End: 2017-12-03

## 2017-12-03 MED ORDER — MAGNESIUM SULFATE 40 GM/1000ML IV SOLN
INTRAVENOUS | Status: DC
Start: ? — End: 2017-12-03

## 2017-12-03 MED ORDER — LACTATED RINGERS IV SOLN
500.00 | INTRAVENOUS | Status: DC
Start: ? — End: 2017-12-03

## 2017-12-03 MED ORDER — HYDROCODONE-ACETAMINOPHEN 5-325 MG PO TABS
ORAL_TABLET | ORAL | Status: DC
Start: ? — End: 2017-12-03

## 2017-12-03 MED ORDER — IBUPROFEN 800 MG PO TABS
800.00 | ORAL_TABLET | ORAL | Status: DC
Start: ? — End: 2017-12-03

## 2017-12-03 MED ORDER — SOD CITRATE-CITRIC ACID 500-334 MG/5ML PO SOLN
30.00 | ORAL | Status: DC
Start: ? — End: 2017-12-03

## 2017-12-03 MED ORDER — FAMOTIDINE 20 MG/2ML IV SOLN
20.00 | INTRAVENOUS | Status: DC
Start: ? — End: 2017-12-03

## 2017-12-03 MED ORDER — MORPHINE SULFATE (PF) 10 MG/ML IV SOLN
10.00 | INTRAVENOUS | Status: DC
Start: ? — End: 2017-12-03

## 2017-12-03 MED ORDER — PRENATAL VITAMINS 28-0.8 MG PO TABS
ORAL_TABLET | ORAL | Status: DC
Start: 2017-12-03 — End: 2017-12-03

## 2017-12-03 MED ORDER — GENERIC EXTERNAL MEDICATION
Status: DC
Start: ? — End: 2017-12-03

## 2017-12-03 MED ORDER — MORPHINE SULFATE (PF) 4 MG/ML IV SOLN
4.00 | INTRAVENOUS | Status: DC
Start: ? — End: 2017-12-03

## 2017-12-03 MED ORDER — SODIUM CHLORIDE 0.9 % IV SOLN
INTRAVENOUS | Status: DC
Start: ? — End: 2017-12-03

## 2017-12-03 MED ORDER — ACETAMINOPHEN 325 MG PO TABS
650.00 | ORAL_TABLET | ORAL | Status: DC
Start: ? — End: 2017-12-03

## 2017-12-03 MED ORDER — LACTATED RINGERS IV SOLN
INTRAVENOUS | Status: DC
Start: ? — End: 2017-12-03

## 2017-12-03 MED ORDER — CALCIUM GLUCONATE 10 % IV SOLN
1.00 | INTRAVENOUS | Status: DC
Start: ? — End: 2017-12-03

## 2017-12-03 MED ORDER — HYDROCODONE-ACETAMINOPHEN 10-325 MG PO TABS
ORAL_TABLET | ORAL | Status: DC
Start: ? — End: 2017-12-03

## 2017-12-03 MED ORDER — HYDROXYPROGESTERONE CAPROATE 250 MG/ML IM OIL
250.00 | TOPICAL_OIL | INTRAMUSCULAR | Status: DC
Start: 2017-12-12 — End: 2017-12-03

## 2017-12-05 ENCOUNTER — Encounter: Payer: Self-pay | Admitting: Obstetrics and Gynecology

## 2017-12-05 ENCOUNTER — Ambulatory Visit (HOSPITAL_COMMUNITY): Admission: RE | Admit: 2017-12-05 | Payer: Medicaid Other | Source: Ambulatory Visit

## 2017-12-05 MED ORDER — GUAIFENESIN 100 MG/5ML PO LIQD
200.00 | ORAL | Status: DC
Start: ? — End: 2017-12-05

## 2017-12-05 MED ORDER — SALINE NASAL SPRAY 0.65 % NA SOLN
NASAL | Status: DC
Start: ? — End: 2017-12-05

## 2017-12-05 MED ORDER — PSYLLIUM FIBER 0.52 G PO CAPS
ORAL_CAPSULE | ORAL | Status: DC
Start: ? — End: 2017-12-05

## 2017-12-05 MED ORDER — TETANUS-DIPHTH-ACELL PERTUSSIS 5-2-15.5 LF-MCG/0.5 IM SUSP
0.50 | INTRAMUSCULAR | Status: DC
Start: ? — End: 2017-12-05

## 2017-12-05 MED ORDER — MAGNESIUM HYDROXIDE 400 MG/5ML PO SUSP
30.00 | ORAL | Status: DC
Start: ? — End: 2017-12-05

## 2017-12-05 MED ORDER — PROMETHAZINE HCL 25 MG/ML IJ SOLN
12.50 | INTRAMUSCULAR | Status: DC
Start: ? — End: 2017-12-05

## 2017-12-05 MED ORDER — DOCUSATE SODIUM 100 MG PO CAPS
100.00 | ORAL_CAPSULE | ORAL | Status: DC
Start: 2017-12-05 — End: 2017-12-05

## 2017-12-05 MED ORDER — FAMOTIDINE 20 MG/2ML IV SOLN
20.00 | INTRAVENOUS | Status: DC
Start: ? — End: 2017-12-05

## 2017-12-05 MED ORDER — LACTATED RINGERS IV SOLN
INTRAVENOUS | Status: DC
Start: ? — End: 2017-12-05

## 2017-12-05 MED ORDER — BENZOCAINE-MENTHOL 15-3.6 MG MT LOZG
LOZENGE | OROMUCOSAL | Status: DC
Start: ? — End: 2017-12-05

## 2017-12-05 MED ORDER — PRENATAL VITAMINS 28-0.8 MG PO TABS
ORAL_TABLET | ORAL | Status: DC
Start: 2017-12-06 — End: 2017-12-05

## 2017-12-05 MED ORDER — ZOLPIDEM TARTRATE 5 MG PO TABS
5.00 | ORAL_TABLET | ORAL | Status: DC
Start: ? — End: 2017-12-05

## 2017-12-05 MED ORDER — LACTATED RINGERS IV SOLN
500.00 | INTRAVENOUS | Status: DC
Start: ? — End: 2017-12-05

## 2017-12-05 MED ORDER — ACETAMINOPHEN 325 MG PO TABS
650.00 | ORAL_TABLET | ORAL | Status: DC
Start: ? — End: 2017-12-05

## 2017-12-05 MED ORDER — GENERIC EXTERNAL MEDICATION
Status: DC
Start: ? — End: 2017-12-05

## 2017-12-05 MED ORDER — SODIUM CHLORIDE 0.9 % IV SOLN
INTRAVENOUS | Status: DC
Start: ? — End: 2017-12-05

## 2017-12-05 MED ORDER — FAMOTIDINE 20 MG PO TABS
20.00 | ORAL_TABLET | ORAL | Status: DC
Start: ? — End: 2017-12-05

## 2017-12-05 NOTE — Progress Notes (Signed)
Patient did not keep OB appointment for 12/03/2017.  Love Milbourne, Jr MD Attending [facility]   

## 2017-12-07 DIAGNOSIS — O365990 Maternal care for other known or suspected poor fetal growth, unspecified trimester, not applicable or unspecified: Secondary | ICD-10-CM

## 2017-12-07 DIAGNOSIS — O139 Gestational [pregnancy-induced] hypertension without significant proteinuria, unspecified trimester: Secondary | ICD-10-CM

## 2017-12-10 ENCOUNTER — Encounter: Payer: Medicaid Other | Admitting: Family Medicine

## 2017-12-12 ENCOUNTER — Ambulatory Visit (HOSPITAL_COMMUNITY): Admission: RE | Admit: 2017-12-12 | Payer: Medicaid Other | Source: Ambulatory Visit

## 2017-12-12 MED ORDER — HYDROMORPHONE HCL 1 MG/ML IJ SOLN
1.00 | INTRAMUSCULAR | Status: DC
Start: ? — End: 2017-12-12

## 2017-12-12 MED ORDER — HYDROCODONE-ACETAMINOPHEN 10-325 MG PO TABS
ORAL_TABLET | ORAL | Status: DC
Start: ? — End: 2017-12-12

## 2017-12-12 MED ORDER — BENZOCAINE-MENTHOL 20-0.5 % EX AERO
INHALATION_SPRAY | CUTANEOUS | Status: DC
Start: ? — End: 2017-12-12

## 2017-12-12 MED ORDER — OXYCODONE HCL 10 MG PO TABS
10.00 | ORAL_TABLET | ORAL | Status: DC
Start: ? — End: 2017-12-12

## 2017-12-12 MED ORDER — PRENATAL VITAMINS 28-0.8 MG PO TABS
ORAL_TABLET | ORAL | Status: DC
Start: 2017-12-10 — End: 2017-12-12

## 2017-12-12 MED ORDER — ANTACID & ANTIGAS 200-200-20 MG/5ML PO SUSP
30.00 | ORAL | Status: DC
Start: ? — End: 2017-12-12

## 2017-12-12 MED ORDER — GENERIC EXTERNAL MEDICATION
Status: DC
Start: ? — End: 2017-12-12

## 2017-12-12 MED ORDER — SIMETHICONE 80 MG PO CHEW
80.00 | CHEWABLE_TABLET | ORAL | Status: DC
Start: ? — End: 2017-12-12

## 2017-12-12 MED ORDER — BISACODYL 10 MG RE SUPP
10.00 | RECTAL | Status: DC
Start: ? — End: 2017-12-12

## 2017-12-12 MED ORDER — HYDROCODONE-ACETAMINOPHEN 5-325 MG PO TABS
ORAL_TABLET | ORAL | Status: DC
Start: ? — End: 2017-12-12

## 2017-12-12 MED ORDER — GUAIFENESIN 100 MG/5ML PO LIQD
200.00 | ORAL | Status: DC
Start: ? — End: 2017-12-12

## 2017-12-12 MED ORDER — DOCUSATE SODIUM 100 MG PO CAPS
100.00 | ORAL_CAPSULE | ORAL | Status: DC
Start: 2017-12-10 — End: 2017-12-12

## 2017-12-12 MED ORDER — TETANUS-DIPHTH-ACELL PERTUSSIS 5-2-15.5 LF-MCG/0.5 IM SUSP
0.50 | INTRAMUSCULAR | Status: DC
Start: ? — End: 2017-12-12

## 2017-12-12 MED ORDER — MAGNESIUM HYDROXIDE 400 MG/5ML PO SUSP
30.00 | ORAL | Status: DC
Start: ? — End: 2017-12-12

## 2017-12-12 MED ORDER — ACETAMINOPHEN 325 MG PO TABS
650.00 | ORAL_TABLET | ORAL | Status: DC
Start: ? — End: 2017-12-12

## 2017-12-12 MED ORDER — MEASLES, MUMPS & RUBELLA VAC ~~LOC~~ INJ
0.50 | INJECTION | SUBCUTANEOUS | Status: DC
Start: ? — End: 2017-12-12

## 2017-12-12 MED ORDER — MORPHINE SULFATE (PF) 4 MG/ML IV SOLN
4.00 | INTRAVENOUS | Status: DC
Start: ? — End: 2017-12-12

## 2017-12-12 MED ORDER — IBUPROFEN 800 MG PO TABS
800.00 | ORAL_TABLET | ORAL | Status: DC
Start: 2017-12-10 — End: 2017-12-12

## 2017-12-12 MED ORDER — LANOLIN EX OINT
TOPICAL_OINTMENT | CUTANEOUS | Status: DC
Start: ? — End: 2017-12-12

## 2017-12-12 MED ORDER — NIFEDIPINE ER 30 MG PO TB24
30.00 | ORAL_TABLET | ORAL | Status: DC
Start: 2017-12-11 — End: 2017-12-12

## 2017-12-12 MED ORDER — MORPHINE SULFATE (PF) 10 MG/ML IV SOLN
10.00 | INTRAVENOUS | Status: DC
Start: ? — End: 2017-12-12

## 2017-12-12 MED ORDER — BENZOCAINE-MENTHOL 15-3.6 MG MT LOZG
LOZENGE | OROMUCOSAL | Status: DC
Start: ? — End: 2017-12-12

## 2017-12-12 MED ORDER — SALINE NASAL SPRAY 0.65 % NA SOLN
NASAL | Status: DC
Start: ? — End: 2017-12-12

## 2017-12-14 ENCOUNTER — Telehealth: Payer: Self-pay | Admitting: General Practice

## 2017-12-14 ENCOUNTER — Encounter: Payer: Self-pay | Admitting: *Deleted

## 2017-12-14 NOTE — Telephone Encounter (Signed)
Called and notified patient of Postpartum appointment on 01/21/18 at 3:55pm.  Patient stated that she will not be able to come for her incision check that was scheduled for 12/19/17 b/c she didn't not won't to [place].  I asked the patient about the incision and she said that it had an odor, but not draining or anything.  Advised patient to have it checked and she said that she will with [facility].

## 2017-12-19 ENCOUNTER — Ambulatory Visit: Payer: Medicaid Other

## 2018-01-21 ENCOUNTER — Ambulatory Visit: Payer: Medicaid Other | Admitting: Obstetrics & Gynecology

## 2018-04-21 IMAGING — US US MFM OB LIMITED
1 series · 15 of 24 positions shown · non-contrast
Comparison: none

[Series 1: us mfm ob limited · 15 of 24 slices shown]
[im 1/24]
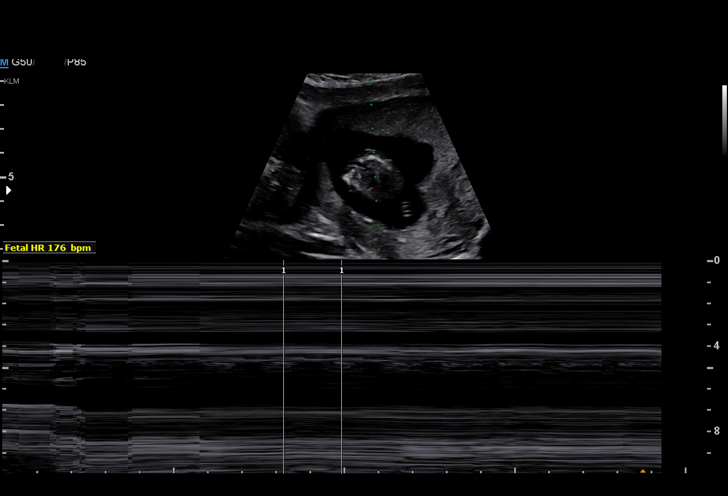
[im 3/24]
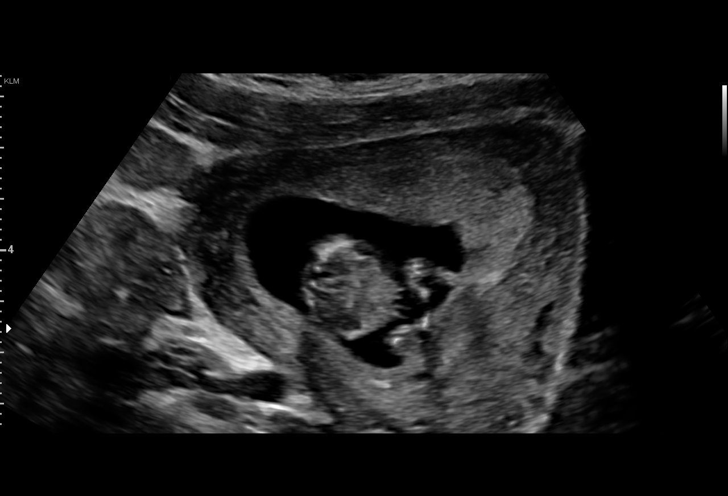
[im 5/24]
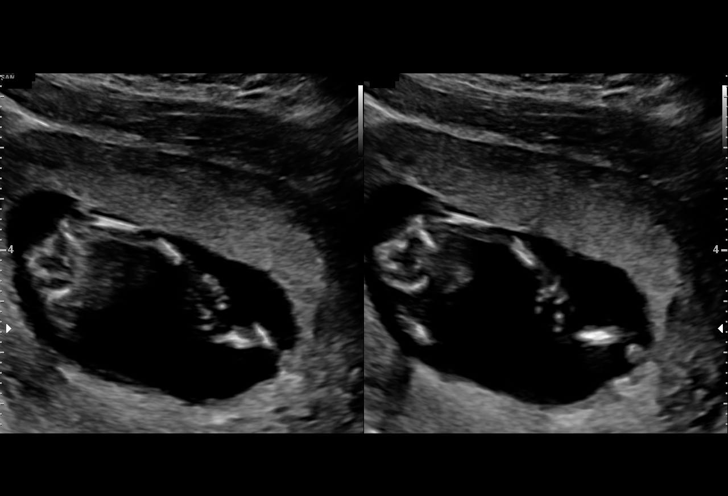
[im 6/24]
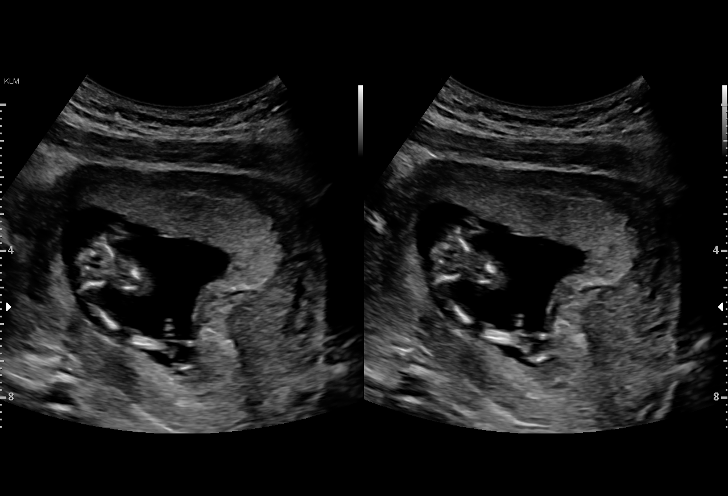
[im 8/24]
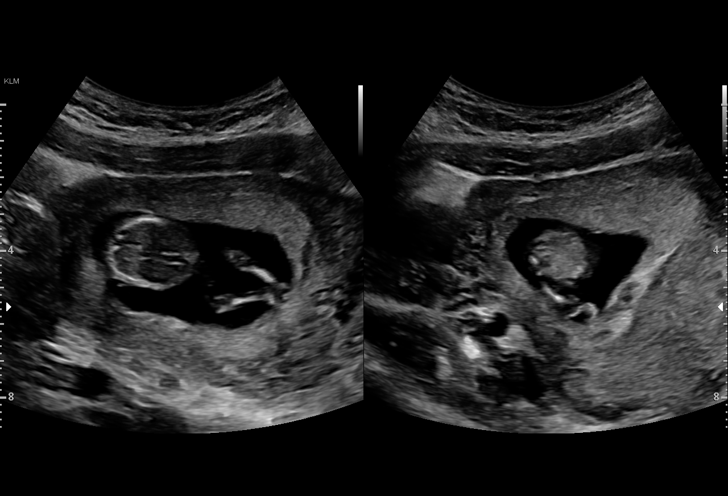
[im 9/24]
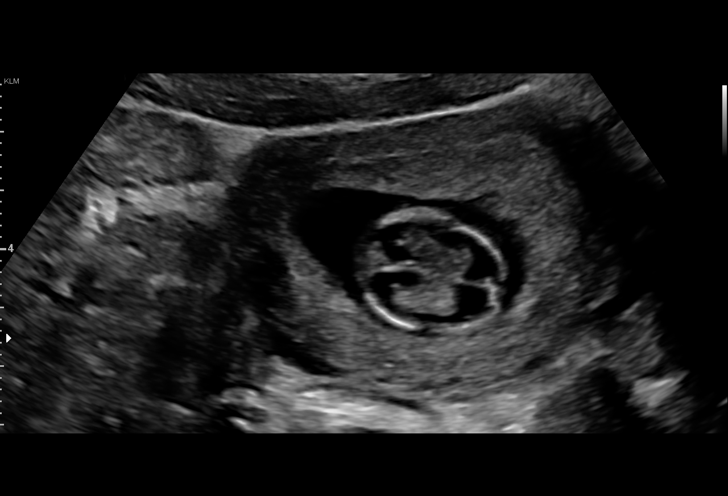
[im 11/24]
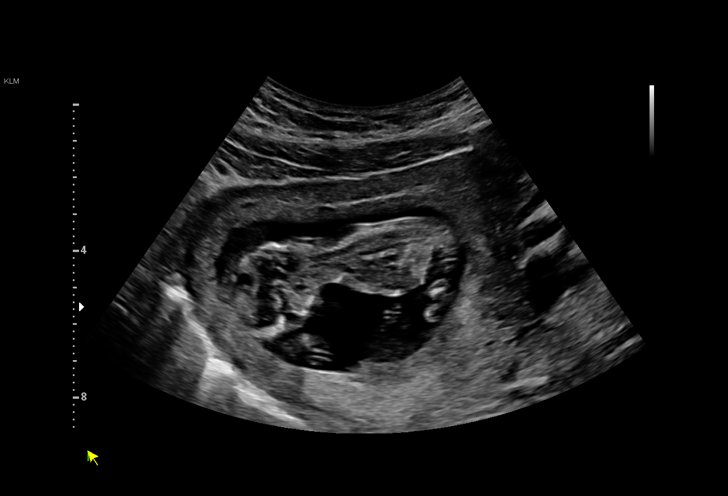
[im 13/24]
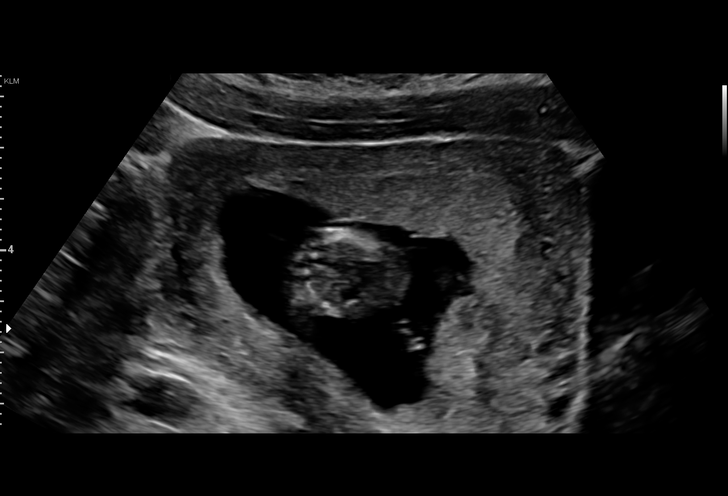
[im 14/24]
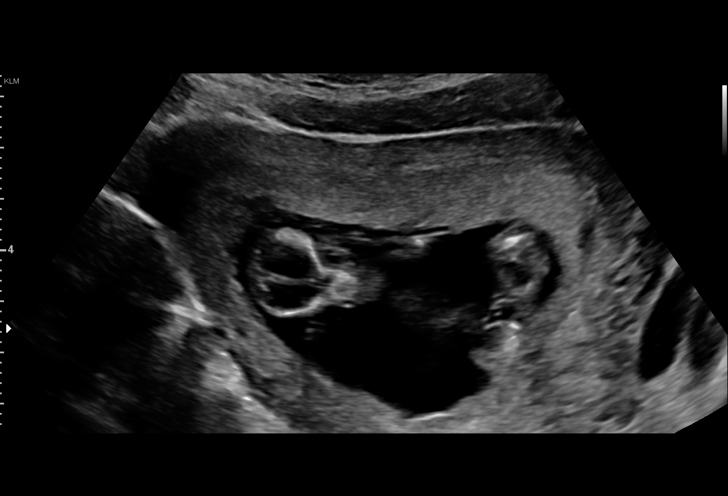
[im 16/24]
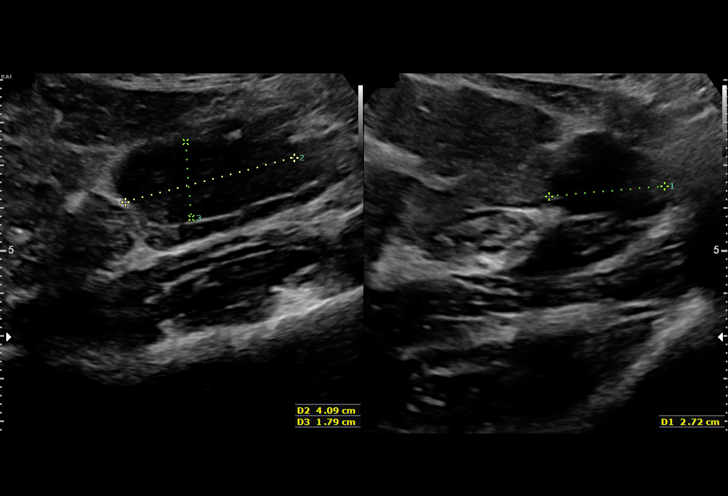
[im 17/24]
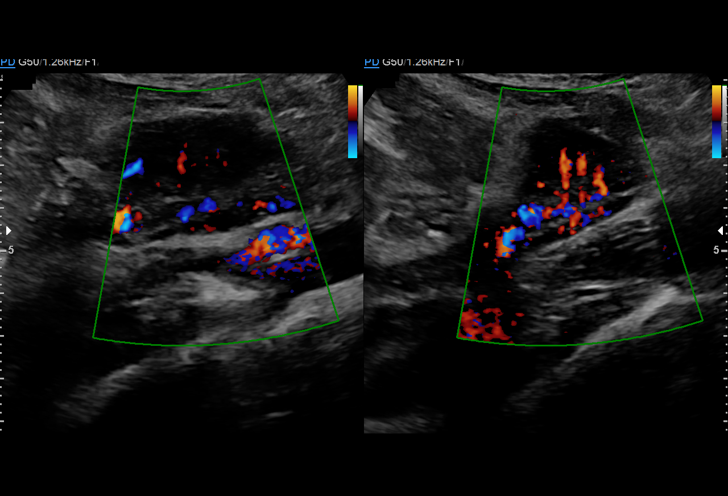
[im 19/24]
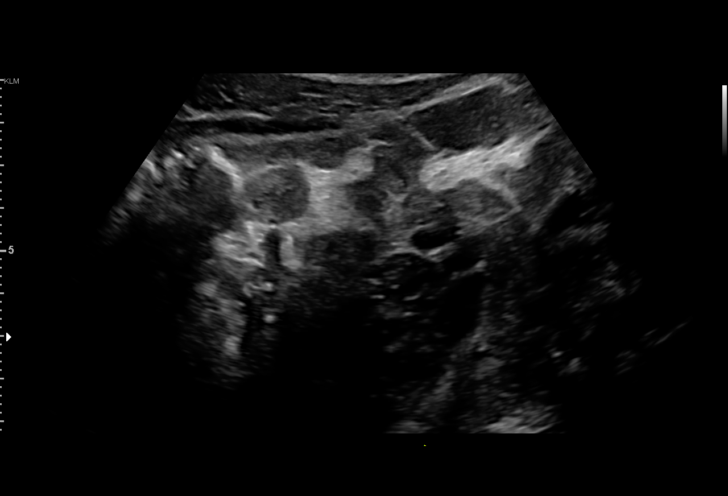
[im 21/24]
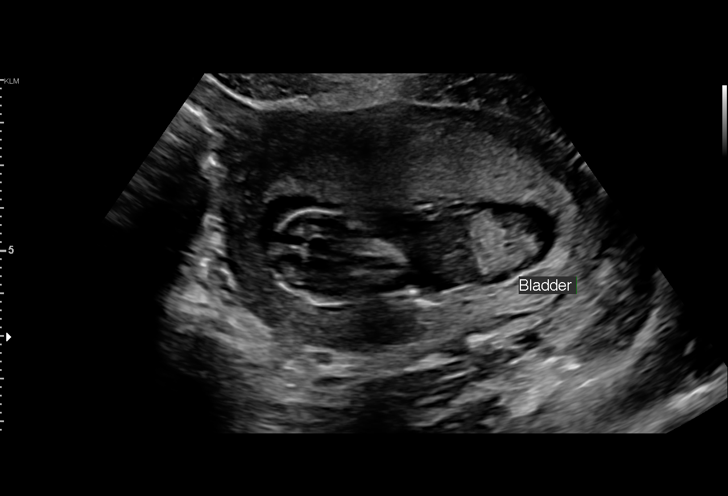
[im 22/24]
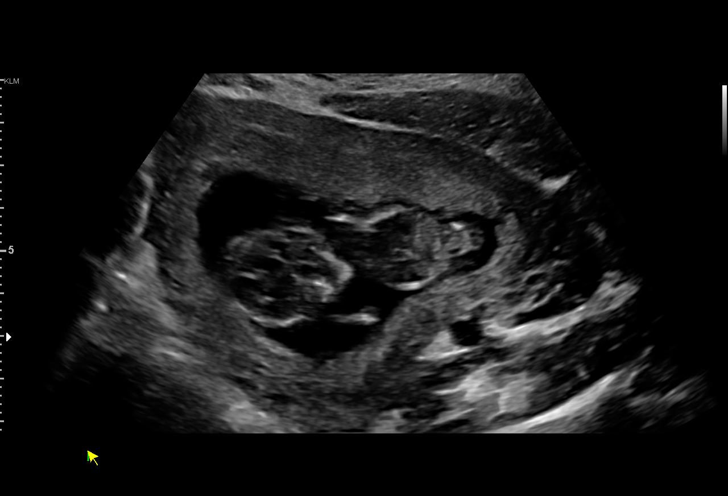
[im 24/24]
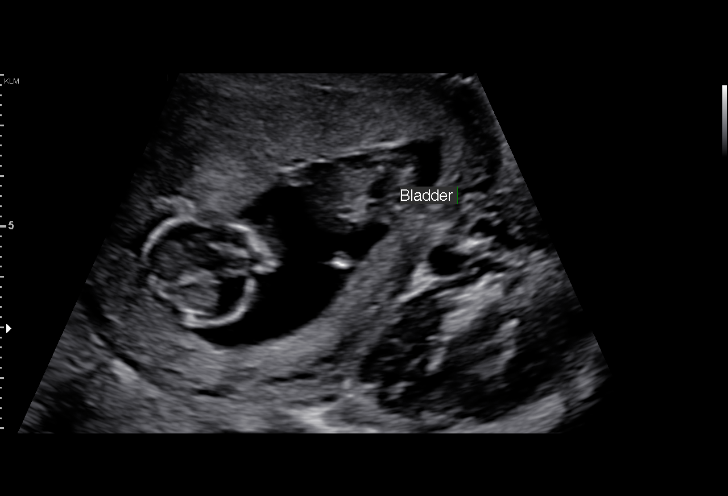

[15 of 24 positions shown; findings below may reference images not displayed]

pm)

OB/Gyn Clinic

1  FRANCINALVA PECCIN           [id number]      [id number]     [id number]
Indications

12 weeks gestation of pregnancy
Encounter for nuchal translucency
History of cesarean delivery, currently
pregnant
Poor obstetric history: Previous IUFD (24
weeks)
OB History

Blood Type:   A+       Height:  5'6"   Weight (lb):  153       BMI:
Gravidity:    3         Term:   0        Prem:   1        SAB:   1
Living:       0
Fetal Evaluation

Num Of Fetuses:     1
Fetal Heart         176
Rate(bpm):
Cardiac Activity:   Observed
Presentation:       Variable
Placenta:           Anterior, above cervical os
P. Cord Insertion:  Visualized

Amniotic Fluid
AFI FV:      Subjectively within normal limits
Gestational Age

LMP:           18w 5d        Date:  04/14/17                 EDD:   01/19/18
Best:          12w 5d     Det. By:  Early Ultrasound         EDD:   03/02/18
(07/18/17)
1st Trimester Genetic Sonogram Screening

CRL:              69  mm    G. Age:   13w 0d                 EDD:   02/28/18
Cervix Uterus Adnexa

Cervix
Closed

Uterus
No abnormality visualized.

Left Ovary
Within normal limits.

Right Ovary
Not visualized. No adnexal mass visualized.

Cul De Sac:   No free fluid seen.

Adnexa:       No abnormality visualized.
Impression

Single living intrauterine pregnancy at 12w 5d.
Unable to complete nuchal translucency measurement due to
persistent fetal position.
Recommendations

Follow up in one week to reattempt NT.

## 2018-04-28 IMAGING — US US MFM OB LIMITED
1 series · 14 of 14 positions shown · non-contrast
Comparison: none

OB/Gyn Clinic

1  BOOKER THAKKER              [id number]      [id number]     [id number]
Indications
13 weeks gestation of pregnancy
Encounter for nuchal translucency
History of cesarean delivery, currently
pregnant
Poor obstetric history: Previous preterm
delivery, antepartum (24 week, preterm
labor)
OB History
Blood Type:   A+       Height:  5'6"   Weight (lb):  153      BMI:
Gravidity:    3         Term:   0        Prem:   1        SAB:   1
Living:       0
Fetal Evaluation
Num Of Fetuses:     1
Fetal Heart         180
Rate(bpm):
Cardiac Activity:   Observed
Presentation:       Variable
Placenta:           Anterior, above cervical os
P. Cord Insertion:  Previously Visualized
Amniotic Fluid
AFI FV:      Subjectively within normal limits
Biometry
CRL:      68.7  mm     G. Age:  13w 0d                  EDD:   03/07/18
Gestational Age
LMP:           19w 5d       Date:   04/14/17                 EDD:   01/19/18
Best:          13w 5d    Det. By:   Early Ultrasound         EDD:   03/02/18
(07/18/17)
Cervix Uterus Adnexa
Cervix
Closed.
Uterus
No abnormality visualized.
Left Ovary
Within normal limits.
Right Ovary
Cul De Sac:   No free fluid seen.
Adnexa:       No abnormality visualized.
Impression
INDICATION: 26 yr old [id number] at 71w9d with history of
preterm delivery for first trimester screen.

[Series 1: us mfm ob limited · 14 of 14 slices shown]
[im 1/14]
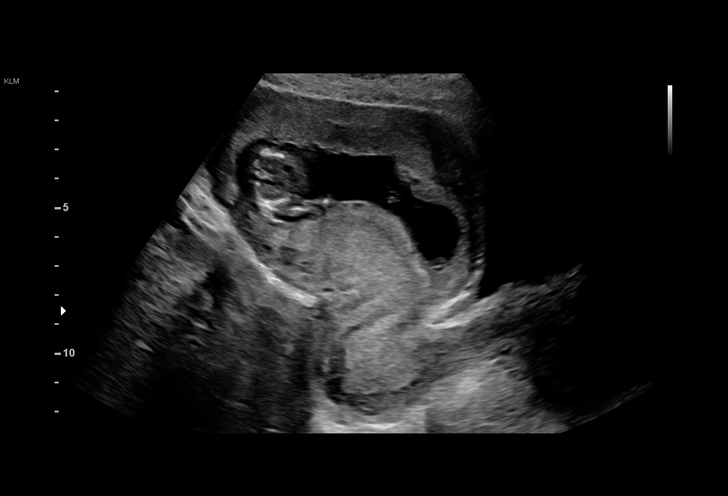
[im 2/14]
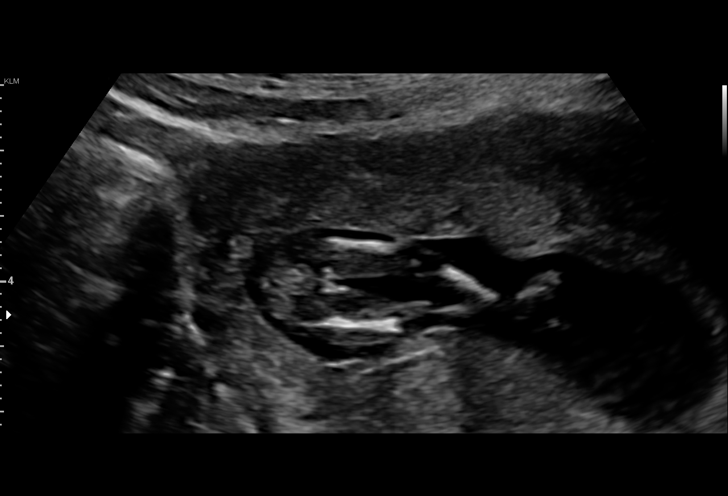
[im 3/14]
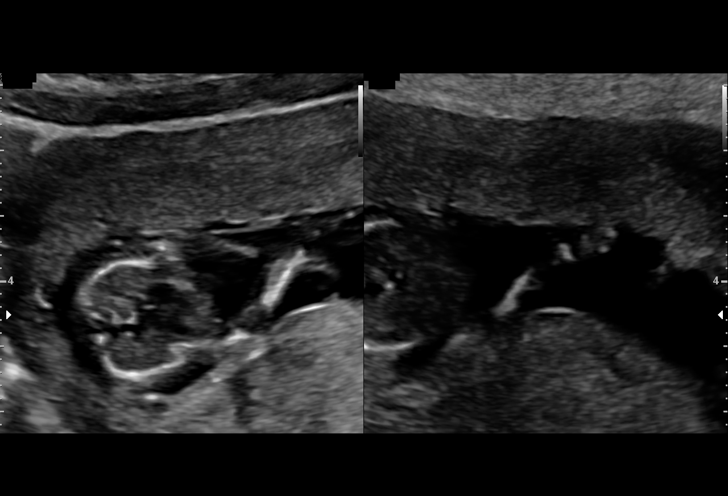
[im 4/14]
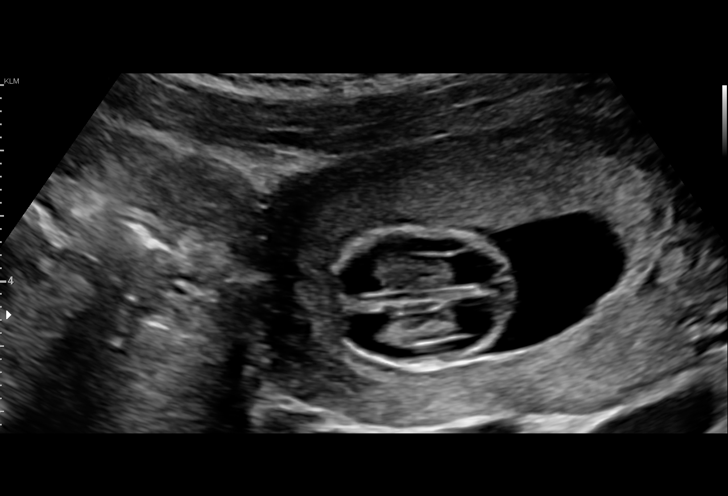
[im 5/14]
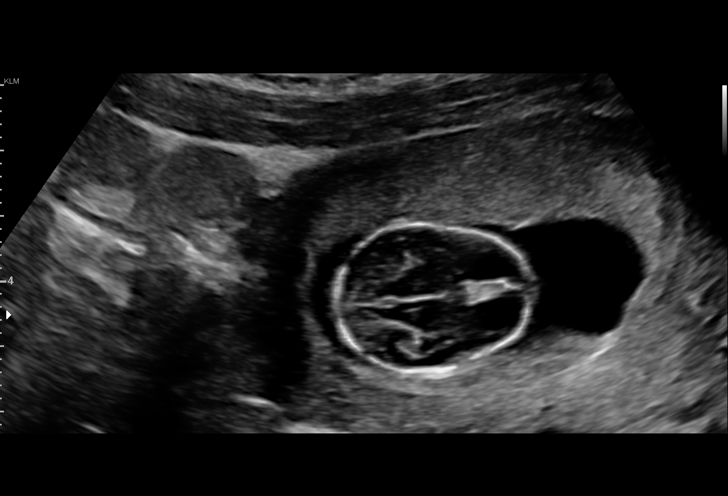
[im 6/14]
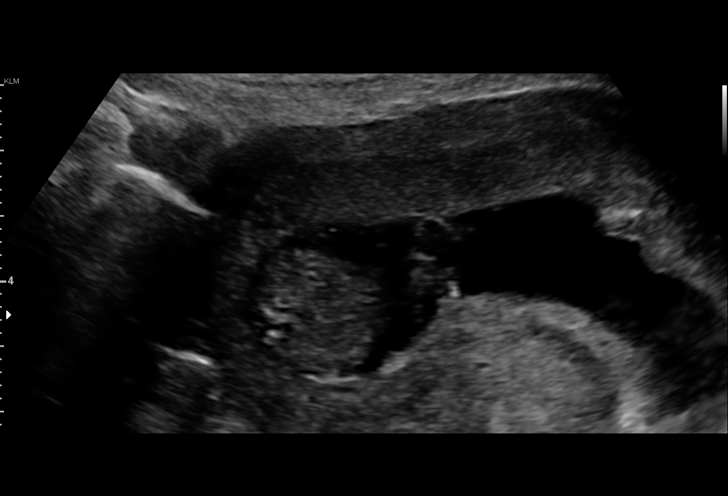
[im 7/14]
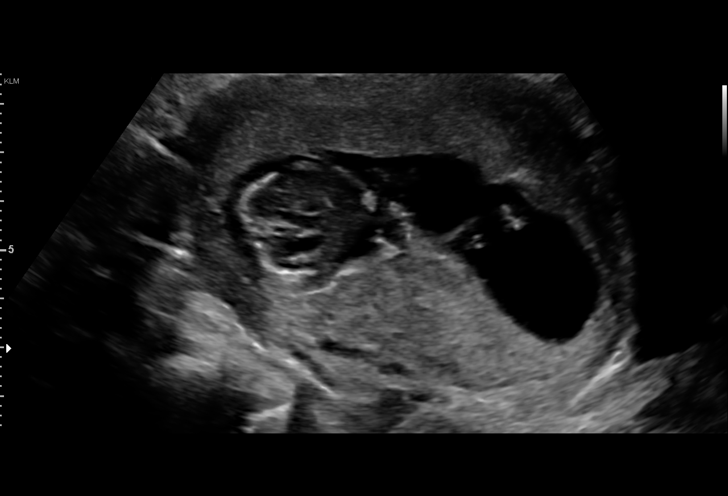
[im 8/14]
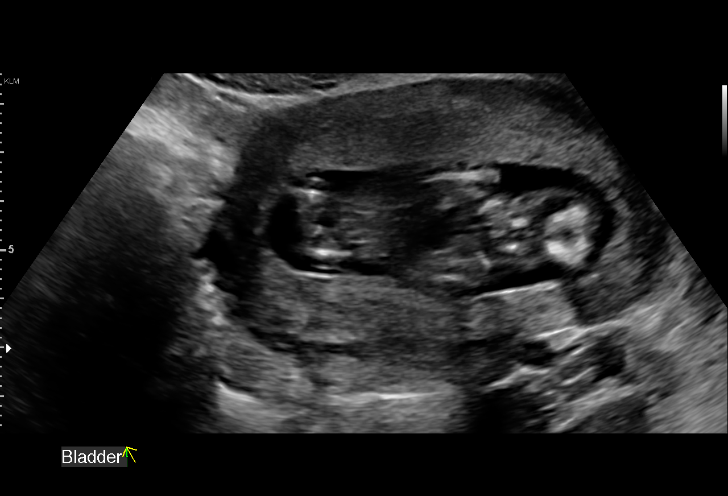
[im 9/14]
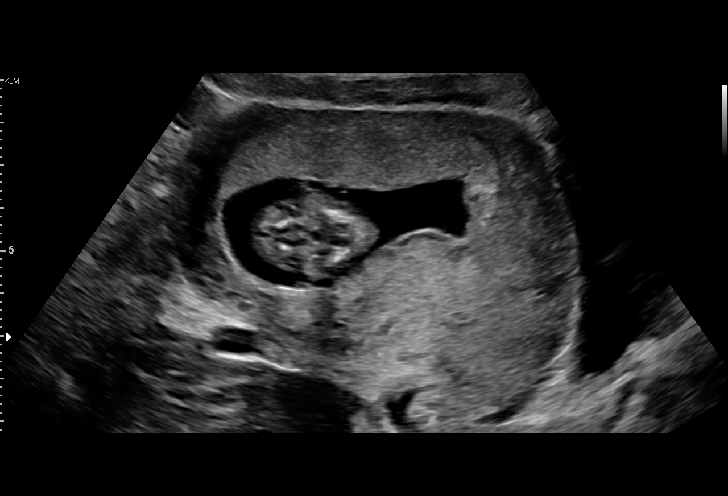
[im 10/14]
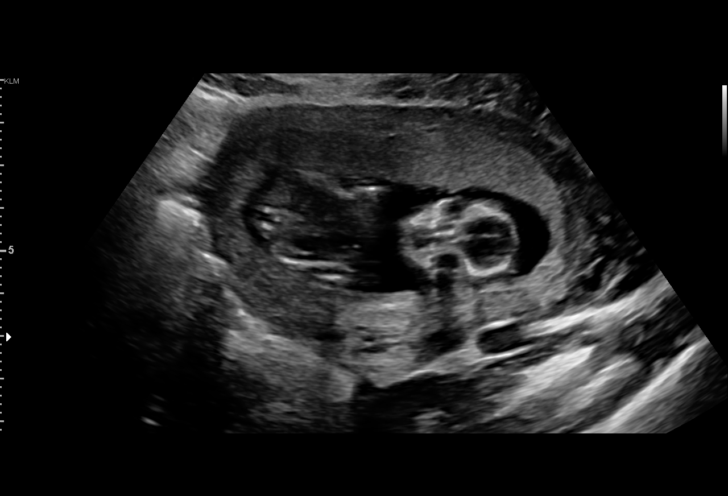
[im 11/14]
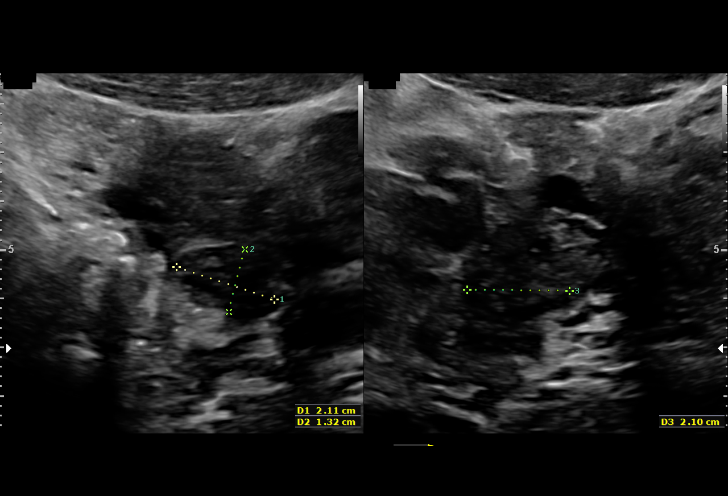
[im 12/14]
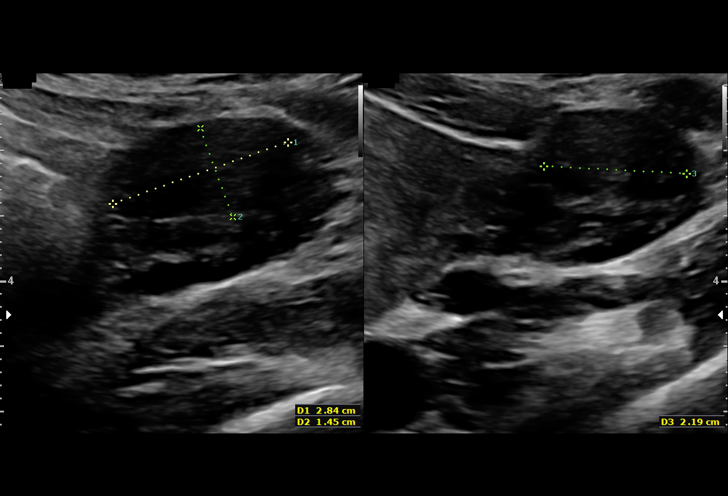
[im 13/14]
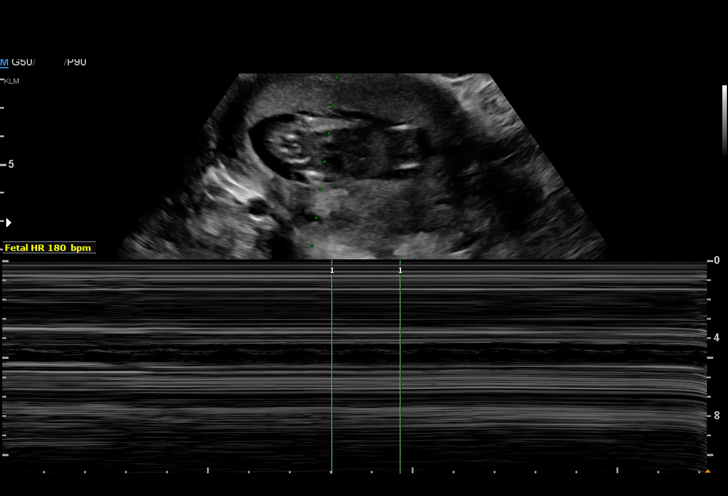
[im 14/14]
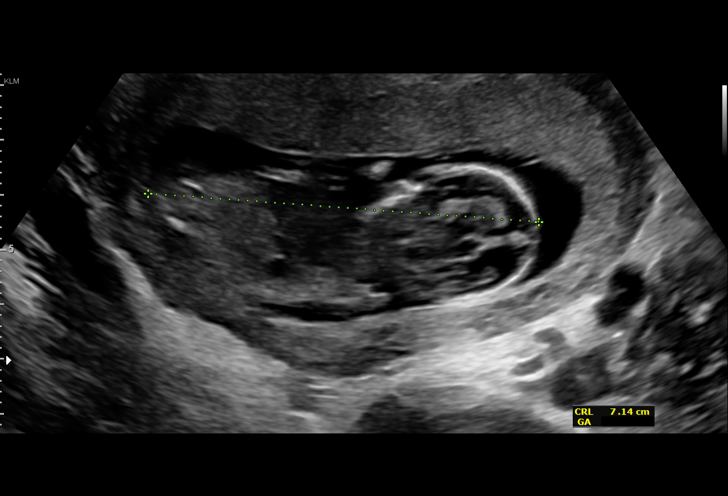

[14 of 14 positions shown; findings below may reference images not displayed]

FINDINGS: 1. Single intrauterine pregnancy.
2. Fetal crown rump length is consistent with dating.
3. Normal uterus; no adnexal masses seen.
4. Evaluation of fetal anatomy is limited by early gestational
age.
5. The nuchal translucency could not be obtained secondary
to fetal position.
6. The nasal bone is indeterminate.
Recommendations

1. Appropriate fetal growth.
2. Discussed we could not obtain the nuchal translucency
after 2 attempts. Recommend quad screen from 15-20
weeks. Discussed limitations of screening tests in detecting
fetal aneuploidy- patient wishes to have quad done when
presents for next ultrasound
3. Previous preterm delivery:
- no consult requested
- patient reports she Eli Eliyev Edene at 16 weeks
- recommend serial cervical lengths from 16-24/26 weeks; if
cervical shortening is seen consider offering cerclage
- recommend preterm labor precautions
4. Recommend fetal anatomic survey at 18-20 weeks

## 2018-05-25 ENCOUNTER — Encounter (HOSPITAL_COMMUNITY): Payer: Self-pay

## 2018-11-01 ENCOUNTER — Other Ambulatory Visit: Payer: Self-pay

## 2018-11-01 ENCOUNTER — Encounter (HOSPITAL_COMMUNITY): Payer: Self-pay

## 2018-11-01 ENCOUNTER — Emergency Department (HOSPITAL_COMMUNITY)
Admission: EM | Admit: 2018-11-01 | Discharge: 2018-11-01 | Disposition: A | Discharge status: Home / Self Care | Payer: Medicaid Other | Attending: Emergency Medicine | Admitting: Emergency Medicine

## 2018-11-01 DIAGNOSIS — Z87891 Personal history of nicotine dependence: Secondary | ICD-10-CM | POA: Insufficient documentation

## 2018-11-01 DIAGNOSIS — Z79899 Other long term (current) drug therapy: Secondary | ICD-10-CM | POA: Diagnosis not present

## 2018-11-01 DIAGNOSIS — J45909 Unspecified asthma, uncomplicated: Secondary | ICD-10-CM | POA: Diagnosis not present

## 2018-11-01 DIAGNOSIS — K029 Dental caries, unspecified: Secondary | ICD-10-CM | POA: Diagnosis not present

## 2018-11-01 DIAGNOSIS — K0889 Other specified disorders of teeth and supporting structures: Secondary | ICD-10-CM | POA: Diagnosis present

## 2018-11-01 MED ORDER — PENICILLIN V POTASSIUM 500 MG PO TABS: 500.0000 mg | ORAL_TABLET | Freq: Four times a day (QID) | ORAL | 0 refills | Status: AC

## 2018-11-01 MED ORDER — IBUPROFEN 800 MG PO TABS: 800.0000 mg | ORAL_TABLET | Freq: Three times a day (TID) | ORAL | 0 refills | Status: DC

## 2018-11-01 NOTE — ED Provider Notes (Signed)
[facility]   CSN: [id number] Arrival date & time: 11/01/18  2146     History   Chief Complaint Chief Complaint  Patient presents with  . Dental Pain    HPI Stacy Christian is a 27 y.o. female.  The history is provided by the patient. No language interpreter was used.  Dental Pain     Stacy Christian is a 27 y.o. female who presents to the Emergency Department complaining of dental pain. She presents to the emergency department for evaluation of lower left dental pain that began several days ago. Pain is located in the left lower molar region and is worse with chewing as well as drinking cold liquids or when cold air hits her face. Pain is located from the jaw and radiates up to her temple and down to her neck at times. Currently she has no pain. She feels like she has some swelling in her face on the left side. No fevers, difficulty swallowing, difficulty breathing. She works in a [employer] in the cold air hitting her face makes her symptoms worse. She has no medical problems and takes no medications. She is not currently pregnant and she is not breast-feeding. Past Medical History:  Diagnosis Date  . Asthma     Patient Active Problem List   Diagnosis Date Noted  . Fetal renal anomaly, single gestation 10/08/2017  . GBS bacteriuria 08/14/2017  . Supervision of high risk pregnancy, antepartum 08/07/2017  . History of premature delivery, currently pregnant 08/07/2017  . History of classical cesarean section 08/07/2017    Past Surgical History:  Procedure Laterality Date  . CESAREAN SECTION    . TONSILLECTOMY       OB History    Gravida  3   Para  1   Term      Preterm  1   AB  1   Living  0     SAB  1   TAB      Ectopic      Multiple      Live Births  1            Home Medications    Prior to Admission medications   Medication Sig Start Date End Date Taking? Authorizing Provider    ibuprofen (ADVIL,MOTRIN) 800 MG tablet Take 1 tablet (800 mg total) by mouth 3 (three) times daily. 11/01/18   Tilden Fossaees, Janayla Marik, MD  penicillin v potassium (VEETID) 500 MG tablet Take 1 tablet (500 mg total) by mouth 4 (four) times daily for 7 days. 11/01/18 11/08/18  Tilden Fossaees, Maydelin Deming, MD  Prenatal Vit-Fe Fumarate-FA (PRENATAL VITAMINS) 28-0.8 MG TABS Take 1 tablet by mouth daily. 09/03/17   [place] BingPickens, Charlie, MD    Family History Family History  Problem Relation Age of Onset  . Diabetes Mother   . Hypertension Mother   . Heart disease Mother   . Diabetes Father   . Hypertension Father   . Heart disease Father   . Diabetes Maternal Grandmother   . Cancer Maternal Grandfather   . Heart disease Paternal Grandmother   . Heart disease Paternal Grandfather     Social History Social History   Tobacco Use  . Smoking status: Former Smoker    Packs/day: 0.15    Last attempt to quit: 08/04/2017    Years since quitting: 1.2  . Smokeless tobacco: Never Used  Substance Use Topics  . Alcohol use: No  . Drug use: Yes  Frequency: 3.0 times per week    Types: Marijuana    Comment: none with pregnancy     Allergies   Patient has no known allergies.   Review of Systems Review of Systems  All other systems reviewed and are negative.    Physical Exam Updated Vital Signs BP (!) 152/99 (BP Location: Right Arm)   Pulse 71   Temp 98 F (36.7 C) (Oral)   Resp 16   SpO2 100%   Physical Exam  Constitutional: She is oriented to person, place, and time. She appears well-developed and well-nourished. No distress.  HENT:  Head: Normocephalic and atraumatic.  TMs clear bilaterally. Tooth number 19 fractured at the gum line without significant gingival induration or erythema.  Eyes: Pupils are equal, round, and reactive to light. EOM are normal.  Neck: Neck supple. No thyromegaly present.  Cardiovascular: Normal rate and regular rhythm.  No murmur heard. Pulmonary/Chest: Effort normal  and breath sounds normal. No respiratory distress.  Musculoskeletal: Normal range of motion.  Lymphadenopathy:    She has no cervical adenopathy.  Neurological: She is alert and oriented to person, place, and time. No cranial nerve deficit.  Skin: Skin is warm and dry. Capillary refill takes less than 2 seconds.  Psychiatric: She has a normal mood and affect. Her behavior is normal.  Nursing note and vitals reviewed.    ED Treatments / Results  Labs (all labs ordered are listed, but only abnormal results are displayed) Labs Reviewed - No data to display  EKG None  Radiology No results found.  Procedures Procedures (including critical care time)  Medications Ordered in ED Medications - No data to display   Initial Impression / Assessment and Plan / ED Course  I have reviewed the triage vital signs and the nursing notes.  Pertinent labs & imaging results that were available during my care of the patient were reviewed by me and considered in my medical decision making (see chart for details).     Patient here for evaluation of dental pain. She is non-toxic appearing on examination. She does have dental decay on examination. No clear evidence of abscess on eval. Discussed with patient home care for dental pain.  Presentation is not consistent with trigeminal neuralgia, temporal arteritis.  Discussed outpatient follow up and return precautions.    Final Clinical Impressions(s) / ED Diagnoses   Final diagnoses:  Dental caries    ED Discharge Orders         Ordered    penicillin v potassium (VEETID) 500 MG tablet  4 times daily     11/01/18 2323    ibuprofen (ADVIL,MOTRIN) 800 MG tablet  3 times daily     11/01/18 2323           Tilden Fossa, MD 11/01/18 2328

## 2018-11-01 NOTE — ED Triage Notes (Signed)
t reports LL dental pain. She states that this is an intermittent chronic problem that has continued since she had bilateral abscesses drained years ago. Pt reports that she called out of work today and her boss said that she needs to get a doctors note. A&Ox4.

## 2019-04-21 ENCOUNTER — Encounter (HOSPITAL_COMMUNITY): Payer: Self-pay

## 2019-04-21 ENCOUNTER — Other Ambulatory Visit: Payer: Self-pay

## 2019-04-21 ENCOUNTER — Inpatient Hospital Stay (HOSPITAL_COMMUNITY)
Admission: AD | Admit: 2019-04-21 | Discharge: 2019-04-21 | Disposition: A | Discharge status: Home / Self Care | Payer: Medicaid Other | Attending: Family Medicine | Admitting: Family Medicine

## 2019-04-21 DIAGNOSIS — Z5321 Procedure and treatment not carried out due to patient leaving prior to being seen by health care provider: Secondary | ICD-10-CM | POA: Diagnosis not present

## 2019-04-21 DIAGNOSIS — R109 Unspecified abdominal pain: Secondary | ICD-10-CM | POA: Insufficient documentation

## 2019-04-21 LAB — URINALYSIS, ROUTINE W REFLEX MICROSCOPIC
Bilirubin Urine: NEGATIVE
Glucose, UA: NEGATIVE mg/dL
Hgb urine dipstick: NEGATIVE
Ketones, ur: NEGATIVE mg/dL
Leukocytes,Ua: NEGATIVE
Nitrite: NEGATIVE
Protein, ur: NEGATIVE mg/dL
Specific Gravity, Urine: 1.012 (ref 1.005–1.030)
pH: 7 (ref 5.0–8.0)

## 2019-04-21 LAB — POCT PREGNANCY, URINE: Preg Test, Ur: POSITIVE — AB

## 2019-04-21 NOTE — MAU Note (Addendum)
Cramping hp test was positive yesterday. Verify pregnancy and find out how many weeks.  first child: went into labor at 24 weeks lived for 2 weeks and 1 day. Second child miscarried at 68 weeks. Third child living. This will be forth pregnancy.

## 2019-04-21 NOTE — MAU Note (Addendum)
Pts went to [facility] and are eating in lobby. No rooms available.

## 2019-04-21 NOTE — MAU Note (Signed)
Not in lobby

## 2019-04-21 NOTE — MAU Note (Signed)
Not in Lobby 

## 2019-05-12 ENCOUNTER — Inpatient Hospital Stay (HOSPITAL_COMMUNITY)
Admission: AD | Admit: 2019-05-12 | Discharge: 2019-05-12 | Disposition: A | Discharge status: Home / Self Care | Payer: Medicaid Other | Attending: Obstetrics & Gynecology | Admitting: Obstetrics & Gynecology

## 2019-05-12 ENCOUNTER — Other Ambulatory Visit: Payer: Self-pay

## 2019-05-12 ENCOUNTER — Encounter (HOSPITAL_COMMUNITY): Payer: Self-pay

## 2019-05-12 ENCOUNTER — Inpatient Hospital Stay (HOSPITAL_COMMUNITY): Payer: Medicaid Other

## 2019-05-12 DIAGNOSIS — Z87891 Personal history of nicotine dependence: Secondary | ICD-10-CM | POA: Diagnosis not present

## 2019-05-12 DIAGNOSIS — Z3A08 8 weeks gestation of pregnancy: Secondary | ICD-10-CM

## 2019-05-12 DIAGNOSIS — O26891 Other specified pregnancy related conditions, first trimester: Secondary | ICD-10-CM | POA: Diagnosis not present

## 2019-05-12 DIAGNOSIS — A5901 Trichomonal vulvovaginitis: Secondary | ICD-10-CM | POA: Diagnosis not present

## 2019-05-12 DIAGNOSIS — R109 Unspecified abdominal pain: Secondary | ICD-10-CM | POA: Insufficient documentation

## 2019-05-12 DIAGNOSIS — R102 Pelvic and perineal pain: Secondary | ICD-10-CM

## 2019-05-12 DIAGNOSIS — O26899 Other specified pregnancy related conditions, unspecified trimester: Secondary | ICD-10-CM

## 2019-05-12 DIAGNOSIS — Z3A09 9 weeks gestation of pregnancy: Secondary | ICD-10-CM

## 2019-05-12 DIAGNOSIS — O98311 Other infections with a predominantly sexual mode of transmission complicating pregnancy, first trimester: Secondary | ICD-10-CM | POA: Diagnosis not present

## 2019-05-12 DIAGNOSIS — Z6790 Unspecified blood type, Rh positive: Secondary | ICD-10-CM

## 2019-05-12 DIAGNOSIS — O23591 Infection of other part of genital tract in pregnancy, first trimester: Secondary | ICD-10-CM | POA: Diagnosis not present

## 2019-05-12 LAB — CBC
HCT: 37.3 % (ref 36.0–46.0)
Hemoglobin: 13.1 g/dL (ref 12.0–15.0)
MCH: 29 pg (ref 26.0–34.0)
MCHC: 35.1 g/dL (ref 30.0–36.0)
MCV: 82.5 fL (ref 80.0–100.0)
Platelets: 277 10*3/uL (ref 150–400)
RBC: 4.52 MIL/uL (ref 3.87–5.11)
RDW: 12.1 % (ref 11.5–15.5)
WBC: 9.2 10*3/uL (ref 4.0–10.5)
nRBC: 0 % (ref 0.0–0.2)

## 2019-05-12 LAB — URINALYSIS, ROUTINE W REFLEX MICROSCOPIC
Bacteria, UA: NONE SEEN
Bilirubin Urine: NEGATIVE
Glucose, UA: NEGATIVE mg/dL
Hgb urine dipstick: NEGATIVE
Ketones, ur: NEGATIVE mg/dL
Nitrite: NEGATIVE
Protein, ur: NEGATIVE mg/dL
Specific Gravity, Urine: 1.005 (ref 1.005–1.030)
pH: 6 (ref 5.0–8.0)

## 2019-05-12 LAB — WET PREP, GENITAL
Sperm: NONE SEEN
Yeast Wet Prep HPF POC: NONE SEEN

## 2019-05-12 LAB — HCG, QUANTITATIVE, PREGNANCY: hCG, Beta Chain, Quant, S: 81900 m[IU]/mL — ABNORMAL HIGH (ref <5)

## 2019-05-12 MED ORDER — METRONIDAZOLE 500 MG PO TABS
2000.0000 mg | ORAL_TABLET | Freq: Once | ORAL | Status: AC
  Administered 2019-05-12: 20:00:00 2000 mg via ORAL
  Filled 2019-05-12: qty 4

## 2019-05-12 NOTE — MAU Provider Note (Addendum)
History     CSN: [id number]  Arrival date and time: 05/12/19 1536   First Provider Initiated Contact with Patient 05/12/19 1810      Chief Complaint  Patient presents with  . Abdominal Pain   Ms. Stacy Christian is a 28 y.o. [phone] at 8274w0d who presents to MAU for abdominal pain.   Onset: 3-4days ago Location: across lower abdomen Duration: 3-4days Character: intermittent, pain causes pt to have to stop whatever she is doing, menstrual-like cramp Aggravating/Associated: none/none Relieving: none Treatment: none Severity: 8/10  Pt denies VB, vaginal discharge/odor/itching. Pt denies N/V, abdominal pain, constipation, diarrhea, or urinary problems. Pt denies fever, chills, fatigue, sweating or changes in appetite. Pt denies SOB or chest pain. Pt denies dizziness, HA, light-headedness, weakness.  Problems this pregnancy include: pt has not yet been seen, hx of PTL/PTD Allergies? NKDA Current medications/supplements? PNVs   OB History    Gravida  4   Para  2   Term      Preterm  2   AB  1   Living  1     SAB  1   TAB      Ectopic      Multiple      Live Births  2           Past Medical History:  Diagnosis Date  . Asthma   . Medical history non-contributory     Past Surgical History:  Procedure Laterality Date  . CESAREAN SECTION    . TONSILLECTOMY      Family History  Problem Relation Age of Onset  . Diabetes Mother   . Hypertension Mother   . Heart disease Mother   . Diabetes Father   . Hypertension Father   . Heart disease Father   . Diabetes Maternal Grandmother   . Cancer Maternal Grandfather   . Heart disease Paternal Grandmother   . Heart disease Paternal Grandfather     Social History   Tobacco Use  . Smoking status: Former Smoker    Packs/day: 0.15    Last attempt to quit: 08/04/2017    Years since quitting: 1.7  . Smokeless tobacco: Never Used  Substance Use Topics  . Alcohol use: No  . Drug use: Not Currently     Frequency: 3.0 times per week    Types: Marijuana    Comment: none with pregnancy    Allergies: No Known Allergies  Medications Prior to Admission  Medication Sig Dispense Refill Last Dose  . Prenatal Vit-Fe Fumarate-FA (PRENATAL VITAMINS) 28-0.8 MG TABS Take 1 tablet by mouth daily. 30 tablet 6 05/11/2019 at Unknown time  . ibuprofen (ADVIL,MOTRIN) 800 MG tablet Take 1 tablet (800 mg total) by mouth 3 (three) times daily. 21 tablet 0 More than a month at Unknown time    Review of Systems  Constitutional: Negative for chills, diaphoresis, fatigue and fever.  Respiratory: Negative for shortness of breath.   Cardiovascular: Negative for chest pain.  Gastrointestinal: Positive for abdominal pain. Negative for constipation, diarrhea, nausea and vomiting.  Genitourinary: Negative for dysuria, flank pain, frequency, pelvic pain, urgency, vaginal bleeding and vaginal discharge.  Neurological: Negative for dizziness, weakness, light-headedness and headaches.   Physical Exam   Blood pressure 131/79, pulse 76, temperature 98.2 F (36.8 C), resp. rate 16, weight 78 kg, last menstrual period 03/10/2019, SpO2 100 %, unknown if currently breastfeeding.  Patient Vitals for the past 24 hrs:  BP Temp Pulse Resp SpO2 Weight  05/12/19 1912 131/79 -  76 - - -  05/12/19 1736 - - - - - 78 kg  05/12/19 1623 127/86 98.2 F (36.8 C) (!) 101 16 100 % -   Physical Exam  Constitutional: She is oriented to person, place, and time. She appears well-developed and well-nourished. No distress.  HENT:  Head: Normocephalic and atraumatic.  Respiratory: Effort normal.  GI: Soft. She exhibits no distension and no mass. There is no abdominal tenderness. There is no rebound and no guarding.  Genitourinary: There is no rash, tenderness or lesion on the right labia. There is no rash, tenderness or lesion on the left labia. Uterus is enlarged. Uterus is not fixed and not tender. Cervix exhibits no motion tenderness,  no discharge and no friability. Right adnexum displays no mass, no tenderness and no fullness. Left adnexum displays no mass, no tenderness and no fullness.    No vaginal discharge, tenderness or bleeding.  No tenderness or bleeding in the vagina.  Neurological: She is alert and oriented to person, place, and time.  Skin: Skin is warm and dry. She is not diaphoretic.  Psychiatric: She has a normal mood and affect. Her behavior is normal. Judgment and thought content normal.   Results for orders placed or performed during the hospital encounter of 05/12/19 (from the past 24 hour(s))  Urinalysis, Routine w reflex microscopic     Status: Abnormal   Collection Time: 05/12/19  5:40 PM  Result Value Ref Range   Color, Urine STRAW (A) YELLOW   APPearance CLEAR CLEAR   Specific Gravity, Urine 1.005 1.005 - 1.030   pH 6.0 5.0 - 8.0   Glucose, UA NEGATIVE NEGATIVE mg/dL   Hgb urine dipstick NEGATIVE NEGATIVE   Bilirubin Urine NEGATIVE NEGATIVE   Ketones, ur NEGATIVE NEGATIVE mg/dL   Protein, ur NEGATIVE NEGATIVE mg/dL   Nitrite NEGATIVE NEGATIVE   Leukocytes,Ua TRACE (A) NEGATIVE   RBC / HPF 0-5 0 - 5 RBC/hpf   WBC, UA 0-5 0 - 5 WBC/hpf   Bacteria, UA NONE SEEN NONE SEEN   Squamous Epithelial / LPF 0-5 0 - 5  Wet prep, genital     Status: Abnormal   Collection Time: 05/12/19  6:20 PM  Result Value Ref Range   Yeast Wet Prep HPF POC NONE SEEN NONE SEEN   Trich, Wet Prep PRESENT (A) NONE SEEN   Clue Cells Wet Prep HPF POC PRESENT (A) NONE SEEN   WBC, Wet Prep HPF POC MANY (A) NONE SEEN   Sperm NONE SEEN   CBC     Status: None   Collection Time: 05/12/19  6:37 PM  Result Value Ref Range   WBC 9.2 4.0 - 10.5 K/uL   RBC 4.52 3.87 - 5.11 MIL/uL   Hemoglobin 13.1 12.0 - 15.0 g/dL   HCT 37.3 36.0 - 46.0 %   MCV 82.5 80.0 - 100.0 fL   MCH 29.0 26.0 - 34.0 pg   MCHC 35.1 30.0 - 36.0 g/dL   RDW 12.1 11.5 - 15.5 %   Platelets 277 150 - 400 K/uL   nRBC 0.0 0.0 - 0.2 %   US Ob Comp Less 14  Wks  Result Date: 05/12/2019 CLINICAL DATA:  28 year old female with pelvic pain in the 1st trimester of pregnancy. Estimated gestational age by LMP 9 weeks 0 days. EXAM: OBSTETRIC <14 WK ULTRASOUND TECHNIQUE: Transabdominal ultrasound was performed for evaluation of the gestation as well as the maternal uterus and adnexal regions. COMPARISON:  None relevant. FINDINGS: Intrauterine gestational sac:  Single Yolk sac:  Visible Embryo:  Visible Cardiac Activity: Detected Heart Rate: 159 bpm CRL:   20.85 mm   8 w 4 d                  US EDC: 12/18/2019 Subchorionic hemorrhage:  None visualized. Maternal uterus/adnexae: The right ovary appears normal measuring 2.2 x 1.8 x 2.5 centimeters. The left ovary appears normal measuring 2.3 x 1.1 x 1.8 centimeters. No pelvic free fluid. IMPRESSION: Single living IUP demonstrated. No acute maternal findings visualized. Electronically Signed   By: Odessa FlemingH  Hall M.D.   On: 05/12/2019 19:21    MAU Course  Procedures  MDM -r/o ectopic -UA: straw/trace leuks, otherwise WNL -CBC: WNL -US: single IUP, 7461w4d, FHB 159 -hCG: pending at time of discharge -ABO: A Positive -WetPrep: +trich, +ClueCells (isolated finding not requiring treatment), many WBCs -GC/CT collected -tx with 2g metronidazole in MAU, RX given for expedited partner therapy -pt discharged to home in stable condition  Orders Placed This Encounter  Procedures  . Wet prep, genital    Standing Status:   Standing    Number of Occurrences:   1  . US OB Comp Less 14 Wks    Standing Status:   Standing    Number of Occurrences:   1    Order Specific Question:   Symptom/Reason for Exam    Answer:   Pelvic pain in pregnancy [[id number]]  . Urinalysis, Routine w reflex microscopic    Standing Status:   Standing    Number of Occurrences:   1  . CBC    Standing Status:   Standing    Number of Occurrences:   1  . hCG, quantitative, pregnancy    Standing Status:   Standing    Number of Occurrences:   1  . Discharge  patient    Order Specific Question:   Discharge disposition    Answer:   01-Home or Self Care [1]    Order Specific Question:   Discharge patient date    Answer:   05/12/2019   Meds ordered this encounter  Medications  . metroNIDAZOLE (FLAGYL) tablet 2,000 mg   Assessment and Plan   1. Pelvic pain in pregnancy   2. Trichomonal vaginitis during pregnancy in first trimester   3. [redacted] weeks gestation of pregnancy   4. Blood type, Rh positive    Allergies as of 05/12/2019   No Known Allergies     Medication List    STOP taking these medications   ibuprofen 800 MG tablet Commonly known as:  ADVIL     TAKE these medications   Prenatal Vitamins 28-0.8 MG Tabs Take 1 tablet by mouth daily.      -will call with culture results, if positive -pt given list of OB providers, advised to call tomorrow @0800  to schedule NOB -RX given for expedited partner therapy -pt advised to abstain from intercourse from today through 7days after partner is treated for trich -return MAU precautions given -pt discharged to home in stable condition  Joni Reiningicole E Nugent 05/12/2019, 8:01 PM

## 2019-05-12 NOTE — MAU Note (Signed)
Stacy Christian is a 28 y.o. at [redacted]w[redacted]d here in MAU reporting: lower abdominal cramping for three days.  LMP: 03/10/2019 Onset of complaint:3 days ago Pain score:7 Vitals:   05/12/19 1623  BP: 127/86  Pulse: (!) 101  Resp: 16  Temp: 98.2 F (36.8 C)  SpO2: 100%      Lab orders placed from triage: UA/UPT

## 2019-05-12 NOTE — Discharge Instructions (Signed)
Abdominal Pain During Pregnancy  Abdominal pain is common during pregnancy, and has many possible causes. Some causes are more serious than others, and sometimes the cause is not known. Abdominal pain can be a sign that labor is starting. It can also be caused by normal growth and stretching of muscles and ligaments during pregnancy. Always tell your health care provider if you have any abdominal pain. Follow these instructions at home:  Do not have sex or put anything in your vagina until your pain goes away completely.  Get plenty of rest until your pain improves.  Drink enough fluid to keep your urine pale yellow.  Take over-the-counter and prescription medicines only as told by your health care provider.  Keep all follow-up visits as told by your health care provider. This is important. Contact a health care provider if:  Your pain continues or gets worse after resting.  You have lower abdominal pain that: ? Comes and goes at regular intervals. ? Spreads to your back. ? Is similar to menstrual cramps.  You have pain or burning when you urinate. Get help right away if:  You have a fever or chills.  You have vaginal bleeding.  You are leaking fluid from your vagina.  You are passing tissue from your vagina.  You have vomiting or diarrhea that lasts for more than 24 hours.  Your baby is moving less than usual.  You feel very weak or faint.  You have shortness of breath.  You develop severe pain in your upper abdomen. Summary  Abdominal pain is common during pregnancy, and has many possible causes.  If you experience abdominal pain during pregnancy, tell your health care provider right away.  Follow your health care provider's home care instructions and keep all follow-up visits as directed. This information is not intended to replace advice given to you by your health care provider. Make sure you discuss any questions you have with your health care  provider. Document Released: 11/20/2005 Document Revised: 02/22/2017 Document Reviewed: 02/22/2017 Elsevier Interactive Patient Education  2019 [employer]  Trichomoniasis Trichomoniasis is an STI (sexually transmitted infection) that can affect both women and men. In women, the outer area of the female genitalia (vulva) and the vagina are affected. In men, the penis is mainly affected, but the prostate and other reproductive organs can also be involved. This condition can be treated with medicine. It often has no symptoms (is asymptomatic), especially in men. What are the causes? This condition is caused by an organism called Trichomonas vaginalis. Trichomoniasis most often spreads from person to person (is contagious) through sexual contact. What increases the risk? The following factors may make you more likely to develop this condition:  Having unprotected sexual intercourse.  Having sexual intercourse with a partner who has trichomoniasis.  Having multiple sexual partners.  Having had previous trichomoniasis infections or other STIs. What are the signs or symptoms? In women, symptoms of trichomoniasis include:  Abnormal vaginal discharge that is clear, white, gray, or yellow-green and foamy and has an unusual "fishy" odor.  Itching and irritation of the vagina and vulva.  Burning or pain during urination or sexual intercourse.  Genital redness and swelling. In men, symptoms of trichomoniasis include:  Penile discharge that may be foamy or contain pus.  Pain in the penis. This may happen only when urinating.  Itching or irritation inside the penis.  Burning after urination or ejaculation. How is this diagnosed? In women, this condition may be found during a  routine Pap test or physical exam. It may be found in men during a routine physical exam. Your health care provider may perform tests to help diagnose this infection, such as:  Urine tests (men and women).  The  following in women: ? Testing the pH of the vagina. ? A vaginal swab test that checks for the Trichomonas vaginalis organism. ? Testing vaginal secretions. Your health care provider may test you for other STIs, including HIV (human immunodeficiency virus). How is this treated? This condition is treated with medicine taken by mouth (orally), such as metronidazole or tinidazole to fight the infection. Your sexual partner(s) may also need to be tested and treated.  If you are a woman and you plan to become pregnant or think you may be pregnant, tell your health care provider right away. Some medicines that are used to treat the infection should not be taken during pregnancy. Your health care provider may recommend over-the-counter medicines or creams to help relieve itching or irritation. You may be tested for infection again 3 months after treatment. Follow these instructions at home:  Take and use over-the-counter and prescription medicines, including creams, only as told by your health care provider.  Do not have sexual intercourse until one week after you finish your medicine, or until your health care provider approves. Ask your health care provider when you may resume sexual intercourse.  (Women) Do not douche or wear tampons while you have the infection.  Discuss your infection with your sexual partner(s). Make sure that your partner gets tested and treated, if necessary.  Keep all follow-up visits as told by your health care provider. This is important. How is this prevented?  Use condoms every time you have sex. Using condoms correctly and consistently can help protect against STIs.  Avoid having multiple sexual partners.  Talk with your sexual partner about any symptoms that either of you may have, as well as any history of STIs.  Get tested for STIs and STDs (sexually transmitted diseases) before you have sex. Ask your partner to do the same.  Do not have sexual contact if you  have symptoms of trichomoniasis or another STI. Contact a health care provider if:  You still have symptoms after you finish your medicine.  You develop pain in your abdomen.  You have pain when you urinate.  You have bleeding after sexual intercourse.  You develop a rash.  You feel nauseous or you vomit.  You plan to become pregnant or think you may be pregnant. Summary  Trichomoniasis is an STI (sexually transmitted infection) that can affect both women and men.  This condition often has no symptoms (is asymptomatic), especially in men.  You should not have sexual intercourse until one week after you finish your medicine, or until your health care provider approves. Ask your health care provider when you may resume sexual intercourse.  Discuss your infection with your sexual partner. Make sure that your partner gets tested and treated, if necessary. This information is not intended to replace advice given to you by your health care provider. Make sure you discuss any questions you have with your health care provider. Document Released: 05/16/2001 Document Revised: 10/13/2016 Document Reviewed: 10/13/2016 Elsevier Interactive Patient Education  2019 [facility] Providers     [facility]     Phone: [phone]  [facility] [employer] [facility] [place]  Phone: [phone]  [facility] [employer] [facility] [place]  Phone: [place] [facility] [place] [facility]                           Phone: [phone]  [facility]          Phone: [phone]  [facility]    Phone: [phone]   [facility]    Phone: [phone]  [facility]    Phone: [phone]  [facility]    Phone: [phone]  [facility]    Phone: [phone]  [facility]  Phone: [phone]  [facility]               Phone: [phone]  [facility]   Phone: [phone]  [facility]    Phone: [phone]

## 2019-05-13 LAB — GC/CHLAMYDIA PROBE AMP (~~LOC~~) NOT AT ARMC
Chlamydia: NEGATIVE
Neisseria Gonorrhea: NEGATIVE

## 2019-07-09 ENCOUNTER — Other Ambulatory Visit: Payer: Self-pay

## 2019-07-09 ENCOUNTER — Ambulatory Visit (INDEPENDENT_AMBULATORY_CARE_PROVIDER_SITE_OTHER): Payer: Medicaid Other | Admitting: *Deleted

## 2019-07-09 DIAGNOSIS — Z8759 Personal history of other complications of pregnancy, childbirth and the puerperium: Secondary | ICD-10-CM

## 2019-07-09 DIAGNOSIS — J4520 Mild intermittent asthma, uncomplicated: Secondary | ICD-10-CM

## 2019-07-09 DIAGNOSIS — Z98891 History of uterine scar from previous surgery: Secondary | ICD-10-CM

## 2019-07-09 DIAGNOSIS — O09899 Supervision of other high risk pregnancies, unspecified trimester: Secondary | ICD-10-CM

## 2019-07-09 DIAGNOSIS — O0990 Supervision of high risk pregnancy, unspecified, unspecified trimester: Secondary | ICD-10-CM | POA: Insufficient documentation

## 2019-07-09 HISTORY — DX: Supervision of high risk pregnancy, unspecified, unspecified trimester: O09.90

## 2019-07-09 MED ORDER — BLOOD PRESSURE KIT DEVI: 1.0000 | 0 refills | Status: DC | PRN

## 2019-07-09 NOTE — Progress Notes (Signed)
I connected with  [name] on 07/09/19 at  1:30 PM EDT by telephone and verified that I am speaking with the correct person using two identifiers.   I discussed the limitations, risks, security and privacy concerns of performing an evaluation and management service by telephone and the availability of in person appointments. I also discussed with the patient that there may be a patient responsible charge related to this service. The patient expressed understanding and agreed to proceed.  She already has MyChart but has not downloaded app yet- I explained how to download the app and gave her help number to reset her password. /log in. Explained we will send her [employer] app- app sent to her while on phone.  Explained we will send a blood pressure cuff to her and the pharmacy that will send it will call her to verify her address.  I also verified her address. Explained we will have her take her blood pressure weekly and enter into the app. Explained she will have some visits in office and some virtually. Reviewed appointment date/ time with her , our location and to wear mask, no visitors. Explained she will have exam, ob bloodwork, hemoglobin a1C, cbg , genetic testing if desired, pap if needed. Explained we will schedule an Korea at 19 weeks and she will get notification via [place]. She voices understanding.  Shewanda Sharpe,RN 07/09/2019  1:30 PM

## 2019-07-11 ENCOUNTER — Encounter: Payer: Self-pay | Admitting: *Deleted

## 2019-07-11 DIAGNOSIS — Z8759 Personal history of other complications of pregnancy, childbirth and the puerperium: Secondary | ICD-10-CM | POA: Insufficient documentation

## 2019-07-11 DIAGNOSIS — J45909 Unspecified asthma, uncomplicated: Secondary | ICD-10-CM | POA: Insufficient documentation

## 2019-07-11 DIAGNOSIS — O09299 Supervision of pregnancy with other poor reproductive or obstetric history, unspecified trimester: Secondary | ICD-10-CM | POA: Insufficient documentation

## 2019-07-14 DIAGNOSIS — O0990 Supervision of high risk pregnancy, unspecified, unspecified trimester: Secondary | ICD-10-CM | POA: Diagnosis not present

## 2019-07-17 ENCOUNTER — Encounter: Payer: Medicaid Other | Admitting: Family Medicine

## 2019-07-17 ENCOUNTER — Telehealth: Payer: Self-pay | Admitting: Family Medicine

## 2019-07-17 NOTE — Telephone Encounter (Signed)
Called to reschedule her appointment due to not having anyone to keep her child.

## 2019-07-23 ENCOUNTER — Ambulatory Visit (HOSPITAL_COMMUNITY): Payer: Medicaid Other | Admitting: *Deleted

## 2019-07-23 ENCOUNTER — Other Ambulatory Visit: Payer: Self-pay

## 2019-07-23 ENCOUNTER — Encounter (HOSPITAL_COMMUNITY): Payer: Self-pay

## 2019-07-23 ENCOUNTER — Ambulatory Visit (HOSPITAL_COMMUNITY)
Admission: RE | Admit: 2019-07-23 | Discharge: 2019-07-23 | Disposition: A | Discharge status: Home / Self Care | Payer: Medicaid Other | Source: Ambulatory Visit | Attending: Obstetrics and Gynecology | Admitting: Obstetrics and Gynecology

## 2019-07-23 DIAGNOSIS — Z8759 Personal history of other complications of pregnancy, childbirth and the puerperium: Secondary | ICD-10-CM

## 2019-07-23 DIAGNOSIS — O0990 Supervision of high risk pregnancy, unspecified, unspecified trimester: Secondary | ICD-10-CM

## 2019-07-23 DIAGNOSIS — Z98891 History of uterine scar from previous surgery: Secondary | ICD-10-CM | POA: Diagnosis not present

## 2019-07-23 DIAGNOSIS — Z3A19 19 weeks gestation of pregnancy: Secondary | ICD-10-CM | POA: Diagnosis not present

## 2019-07-23 DIAGNOSIS — O09292 Supervision of pregnancy with other poor reproductive or obstetric history, second trimester: Secondary | ICD-10-CM | POA: Diagnosis not present

## 2019-07-23 DIAGNOSIS — O09212 Supervision of pregnancy with history of pre-term labor, second trimester: Secondary | ICD-10-CM

## 2019-07-23 DIAGNOSIS — O09899 Supervision of other high risk pregnancies, unspecified trimester: Secondary | ICD-10-CM

## 2019-07-23 DIAGNOSIS — O09219 Supervision of pregnancy with history of pre-term labor, unspecified trimester: Secondary | ICD-10-CM | POA: Diagnosis not present

## 2019-07-23 DIAGNOSIS — O34219 Maternal care for unspecified type scar from previous cesarean delivery: Secondary | ICD-10-CM | POA: Diagnosis not present

## 2019-07-24 ENCOUNTER — Other Ambulatory Visit (HOSPITAL_COMMUNITY): Payer: Self-pay | Admitting: *Deleted

## 2019-07-24 DIAGNOSIS — O09292 Supervision of pregnancy with other poor reproductive or obstetric history, second trimester: Secondary | ICD-10-CM

## 2019-08-06 ENCOUNTER — Other Ambulatory Visit: Payer: Self-pay

## 2019-08-06 ENCOUNTER — Encounter: Payer: Self-pay | Admitting: Obstetrics & Gynecology

## 2019-08-06 ENCOUNTER — Ambulatory Visit (INDEPENDENT_AMBULATORY_CARE_PROVIDER_SITE_OTHER): Payer: Medicaid Other | Admitting: Obstetrics & Gynecology

## 2019-08-06 VITALS — BP 114/74 | HR 81 | Temp 98.6°F | Wt 179.8 lb

## 2019-08-06 DIAGNOSIS — O09292 Supervision of pregnancy with other poor reproductive or obstetric history, second trimester: Secondary | ICD-10-CM

## 2019-08-06 DIAGNOSIS — Z23 Encounter for immunization: Secondary | ICD-10-CM

## 2019-08-06 DIAGNOSIS — Z3A21 21 weeks gestation of pregnancy: Secondary | ICD-10-CM

## 2019-08-06 DIAGNOSIS — O09899 Supervision of other high risk pregnancies, unspecified trimester: Secondary | ICD-10-CM

## 2019-08-06 DIAGNOSIS — O0990 Supervision of high risk pregnancy, unspecified, unspecified trimester: Secondary | ICD-10-CM

## 2019-08-06 DIAGNOSIS — O34219 Maternal care for unspecified type scar from previous cesarean delivery: Secondary | ICD-10-CM

## 2019-08-06 DIAGNOSIS — O09892 Supervision of other high risk pregnancies, second trimester: Secondary | ICD-10-CM | POA: Diagnosis not present

## 2019-08-06 DIAGNOSIS — Z8759 Personal history of other complications of pregnancy, childbirth and the puerperium: Secondary | ICD-10-CM

## 2019-08-06 DIAGNOSIS — Z98891 History of uterine scar from previous surgery: Secondary | ICD-10-CM

## 2019-08-06 DIAGNOSIS — O0992 Supervision of high risk pregnancy, unspecified, second trimester: Secondary | ICD-10-CM

## 2019-08-06 DIAGNOSIS — O09212 Supervision of pregnancy with history of pre-term labor, second trimester: Secondary | ICD-10-CM

## 2019-08-06 MED ORDER — ASPIRIN EC 81 MG PO TBEC: 81.0000 mg | DELAYED_RELEASE_TABLET | Freq: Every day | ORAL | 3 refills | Status: DC

## 2019-08-06 NOTE — Progress Notes (Signed)
Subjective:    Stacy Christian is a [id number] [redacted]w[redacted]d being seen today for her first obstetrical visit.  Her obstetrical history is significant for preterm deliveries, neonatal demise, IUGR, GHTN, classical CS and repeat. Patient does intend to breast feed. Pregnancy history fully reviewed.  Patient reports no complaints.  Vitals:   08/06/19 1411  BP: 114/74  Pulse: 81  Temp: 98.6 F (37 C)  Weight: 179 lb 12.8 oz (81.6 kg)    HISTORY: OB History  Gravida Para Term Preterm AB Living  4 2   2 1 1   SAB TAB Ectopic Multiple Live Births  1       2    # Outcome Date GA Lbr Len/2nd Weight Sex Delivery Anes PTL Lv  4 Current           3 Preterm 12/07/17 [redacted]w[redacted]d   F CS-Unspec   LIV     Complications: Gestational hypertension, IUGR (intrauterine growth restriction) affecting care of mother  2 SAB 12/23/14 [redacted]w[redacted]d         1 Preterm 06/26/07 [redacted]w[redacted]d  1 lb 7 oz (0.652 kg) F CS-Classical  Y ND     Complications: Abruptio Placenta, Preterm labor   Past Medical History:  Diagnosis Date  . Asthma   . Preterm labor    Past Surgical History:  Procedure Laterality Date  . CESAREAN SECTION    . TONSILLECTOMY     Family History  Problem Relation Age of Onset  . Diabetes Mother   . Hypertension Mother   . Heart disease Mother   . Diabetes Father   . Hypertension Father   . Heart disease Father   . Diabetes Maternal Grandmother   . Cancer Maternal Grandfather   . Heart disease Paternal Grandmother   . Heart disease Paternal Grandfather      Exam    Uterus:     Pelvic Exam:    Perineum: No Hemorrhoids   Vulva: normal   Vagina:  normal mucosa   pH:     Cervix: no lesions   Adnexa: not evaluated   Bony Pelvis: average  System: Breast:  normal appearance, no masses or tenderness   Skin: normal coloration and turgor, no rashes    Neurologic: oriented, normal mood   Extremities: normal strength, tone, and muscle mass   HEENT neck supple with midline trachea   Mouth/Teeth mucous  membranes moist, pharynx normal without lesions   Neck supple   Cardiovascular: regular rate and rhythm, no murmurs or gallops   Respiratory:  appears well, vitals normal, no respiratory distress, acyanotic, normal RR, neck free of mass or lymphadenopathy, chest clear, no wheezing, crepitations, rhonchi, normal symmetric air entry   Abdomen: soft, non-tender; bowel sounds normal; no masses,  no organomegaly   Urinary: urethral meatus normal      Assessment:    Pregnancy: [id number] Patient Active Problem List   Diagnosis Date Noted  . History of gestational hypertension 07/11/2019  . Asthma   . Supervision of high risk pregnancy, antepartum 07/09/2019  . History of premature delivery, currently pregnant 08/07/2017  . History of classical cesarean section 08/07/2017        Plan:     Initial labs drawn. Prenatal vitamins. Problem list reviewed and updated. Genetic Screening discussed Panorama, AFP.  Ultrasound discussed; fetal survey: results reviewed.  Follow up in 4 weeks. 50% of 30 min visit spent on counseling and coordination of care.  MFM consult and Cx length   Emeterio Reeve 08/06/2019

## 2019-08-06 NOTE — Progress Notes (Signed)
Medicaid Home Form Completed-08/06/2019 

## 2019-08-06 NOTE — Addendum Note (Signed)
Addended by: Bethanne Ginger on: 08/06/2019 04:07 PM   Modules accepted: Orders

## 2019-08-06 NOTE — Patient Instructions (Signed)

## 2019-08-06 NOTE — Progress Notes (Signed)
BP Cuff given & educated today-08/06/2019

## 2019-08-07 LAB — PROTEIN / CREATININE RATIO, URINE
Creatinine, Urine: 165.5 mg/dL
Protein, Ur: 8.7 mg/dL
Protein/Creat Ratio: 53 mg/g creat (ref 0–200)

## 2019-08-08 LAB — AFP, SERUM, OPEN SPINA BIFIDA
AFP MoM: 1.26
AFP Value: 80.8 ng/mL
Gest. Age on Collection Date: 21.2 weeks
Maternal Age At EDD: 28.8 yr
OSBR Risk 1 IN: 10000
Test Results:: NEGATIVE
Weight: 179 [lb_av]

## 2019-08-08 LAB — OBSTETRIC PANEL, INCLUDING HIV
Antibody Screen: NEGATIVE
Basophils Absolute: 0.1 10*3/uL (ref 0.0–0.2)
Basos: 1 %
EOS (ABSOLUTE): 0.2 10*3/uL (ref 0.0–0.4)
Eos: 3 %
HIV Screen 4th Generation wRfx: NONREACTIVE
Hematocrit: 30.3 % — ABNORMAL LOW (ref 34.0–46.6)
Hemoglobin: 10.7 g/dL — ABNORMAL LOW (ref 11.1–15.9)
Hepatitis B Surface Ag: NEGATIVE
Immature Grans (Abs): 0 10*3/uL (ref 0.0–0.1)
Immature Granulocytes: 1 %
Lymphocytes Absolute: 1.9 10*3/uL (ref 0.7–3.1)
Lymphs: 21 %
MCH: 30.3 pg (ref 26.6–33.0)
MCHC: 35.3 g/dL (ref 31.5–35.7)
MCV: 86 fL (ref 79–97)
Monocytes Absolute: 0.5 10*3/uL (ref 0.1–0.9)
Monocytes: 6 %
Neutrophils Absolute: 6 10*3/uL (ref 1.4–7.0)
Neutrophils: 68 %
Platelets: 239 10*3/uL (ref 150–450)
RBC: 3.53 x10E6/uL — ABNORMAL LOW (ref 3.77–5.28)
RDW: 13.6 % (ref 11.7–15.4)
RPR Ser Ql: NONREACTIVE
Rh Factor: POSITIVE
Rubella Antibodies, IGG: 1.96 index (ref >0.99)
WBC: 8.7 10*3/uL (ref 3.4–10.8)

## 2019-08-08 LAB — HEMOGLOBIN A1C
Est. average glucose Bld gHb Est-mCnc: 97 mg/dL
Hgb A1c MFr Bld: 5 % (ref 4.8–5.6)

## 2019-08-08 LAB — COMPREHENSIVE METABOLIC PANEL
ALT: 5 IU/L (ref 0–32)
AST: 11 IU/L (ref 0–40)
Albumin/Globulin Ratio: 1.3 (ref 1.2–2.2)
Albumin: 3.9 g/dL (ref 3.9–5.0)
Alkaline Phosphatase: 69 IU/L (ref 39–117)
BUN/Creatinine Ratio: 14 (ref 9–23)
BUN: 7 mg/dL (ref 6–20)
Bilirubin Total: <0.2 mg/dL (ref 0.0–1.2)
CO2: 20 mmol/L (ref 20–29)
Calcium: 8.7 mg/dL (ref 8.7–10.2)
Chloride: 103 mmol/L (ref 96–106)
Creatinine, Ser: 0.5 mg/dL — ABNORMAL LOW (ref 0.57–1.00)
GFR calc Af Amer: 152 mL/min/{1.73_m2} (ref >59)
GFR calc non Af Amer: 132 mL/min/{1.73_m2} (ref >59)
Globulin, Total: 3.1 g/dL (ref 1.5–4.5)
Glucose: 65 mg/dL (ref 65–99)
Potassium: 3.8 mmol/L (ref 3.5–5.2)
Sodium: 137 mmol/L (ref 134–144)
Total Protein: 7 g/dL (ref 6.0–8.5)

## 2019-08-08 LAB — TSH: TSH: 1.51 u[IU]/mL (ref 0.450–4.500)

## 2019-08-08 LAB — CULTURE, OB URINE

## 2019-08-08 LAB — URINE CULTURE, OB REFLEX

## 2019-08-12 ENCOUNTER — Encounter: Payer: Self-pay | Admitting: *Deleted

## 2019-08-15 ENCOUNTER — Encounter: Payer: Self-pay | Admitting: *Deleted

## 2019-08-27 ENCOUNTER — Telehealth: Payer: Self-pay | Admitting: Obstetrics & Gynecology

## 2019-08-27 NOTE — Telephone Encounter (Signed)
Spoke with patient about her appointment on 9/24 @ 10:55. Patient instructed that the appointment is a mychart visit. Patient instructed to download the mychart app if not already done so. Patient verbalized she has the app downloaded. Patient instructed that a nurse will be calling her around her appointment time.

## 2019-08-28 ENCOUNTER — Telehealth (INDEPENDENT_AMBULATORY_CARE_PROVIDER_SITE_OTHER): Payer: Medicaid Other | Admitting: Obstetrics & Gynecology

## 2019-08-28 ENCOUNTER — Other Ambulatory Visit: Payer: Self-pay

## 2019-08-28 VITALS — BP 117/77 | HR 66

## 2019-08-28 DIAGNOSIS — O0992 Supervision of high risk pregnancy, unspecified, second trimester: Secondary | ICD-10-CM

## 2019-08-28 DIAGNOSIS — O09212 Supervision of pregnancy with history of pre-term labor, second trimester: Secondary | ICD-10-CM

## 2019-08-28 DIAGNOSIS — J4520 Mild intermittent asthma, uncomplicated: Secondary | ICD-10-CM

## 2019-08-28 DIAGNOSIS — O09899 Supervision of other high risk pregnancies, unspecified trimester: Secondary | ICD-10-CM

## 2019-08-28 DIAGNOSIS — O09292 Supervision of pregnancy with other poor reproductive or obstetric history, second trimester: Secondary | ICD-10-CM

## 2019-08-28 DIAGNOSIS — Z98891 History of uterine scar from previous surgery: Secondary | ICD-10-CM

## 2019-08-28 DIAGNOSIS — O0990 Supervision of high risk pregnancy, unspecified, unspecified trimester: Secondary | ICD-10-CM

## 2019-08-28 DIAGNOSIS — Z3A24 24 weeks gestation of pregnancy: Secondary | ICD-10-CM

## 2019-08-28 DIAGNOSIS — Z8759 Personal history of other complications of pregnancy, childbirth and the puerperium: Secondary | ICD-10-CM

## 2019-08-28 NOTE — Progress Notes (Signed)
   [place] VIRTUAL VIDEO VISIT ENCOUNTER NOTE  Provider location: [facility] [place] [facility] [place]   I connected with Stacy Christian on 08/28/19 at 10:55 AM EDT by MyChart Video Encounter at home and verified that I am speaking with the correct person using two identifiers.   I discussed the limitations, risks, security and privacy concerns of performing an evaluation and management service virtually and the availability of in person appointments. I also discussed with the patient that there may be a patient responsible charge related to this service. The patient expressed understanding and agreed to proceed. Subjective:  Stacy Christian is a 28 y.o. [phone] at [redacted]w[redacted]d being seen today for ongoing prenatal care.  She is currently monitored for the following issues for this high-risk pregnancy and has History of premature delivery, currently pregnant; History of classical cesarean section; Supervision of high risk pregnancy, antepartum; History of gestational hypertension; and Asthma on their problem list.  Patient reports no complaints.  Contractions: Not present. Vag. Bleeding: None.  Movement: Present. Denies any leaking of fluid.   The following portions of the patient's history were reviewed and updated as appropriate: allergies, current medications, past family history, past medical history, past social history, past surgical history and problem list.   Objective:   Vitals:   08/28/19 1105  BP: 117/77  Pulse: 66    Fetal Status:     Movement: Present     General:  Alert, oriented and cooperative. Patient is in no acute distress.  Respiratory: Normal respiratory effort, no problems with respiration noted  Mental Status: Normal mood and affect. Normal behavior. Normal judgment and thought content.  Rest of physical exam deferred due to type of encounter   Assessment and Plan:  Pregnancy: [id number] at [redacted]w[redacted]d 1. History of gestational hypertension Not  taking baby ASA. Had questions about need for med. I reviewed this with the pt and she will begin.      2. Supervision of high risk pregnancy, antepartum [place] for growth on 10/7  3. History of premature delivery, currently pregnant S/p del at 65 and 84 Pt not on 17-OH-P. The nurses will check to see where the order is and get pt started on meds.    1st delivery was spon PTL the second was delivery for fetal distress after hospitalization.    4. History of classical cesarean section Vaginal delivery contraindicated. Del by 36 weeks.   5. Mild intermittent asthma without complication No sx since HS.   Preterm labor symptoms and general obstetric precautions including but not limited to vaginal bleeding, contractions, leaking of fluid and fetal movement were reviewed in detail with the patient. I discussed the assessment and treatment plan with the patient. The patient was provided an opportunity to ask questions and all were answered. The patient agreed with the plan and demonstrated an understanding of the instructions. The patient was advised to call back or seek an in-person office evaluation/go to MAU at [facility] for any urgent or concerning symptoms. Please refer to After Visit Summary for other counseling recommendations.   I provided 15 minutes of face-to-face time during this encounter.  Return in about 4 weeks (around 09/25/2019).  Future Appointments  Date Time Provider [place]  09/10/2019  1:45 PM WH-MFC [place] 2 WH-MFCUS MFC-US  09/10/2019  1:50 PM [place] MFC-US    Lavonia Drafts, MD Center for [facility] [place] [facility]

## 2019-08-29 ENCOUNTER — Telehealth: Payer: Self-pay | Admitting: Emergency Medicine

## 2019-08-29 NOTE — Telephone Encounter (Signed)
Sekiu and ordered 17-p per Dr. Ihor Dow verbal order.   Pt called and informed that she would be receiving the prescription by mail and to call the office once she has received them to make an appointment to receive first injection and receive education to self administer. Pt verbalized understanding and had no further questions.

## 2019-09-08 ENCOUNTER — Encounter: Payer: Self-pay | Admitting: *Deleted

## 2019-09-10 ENCOUNTER — Other Ambulatory Visit (HOSPITAL_COMMUNITY): Payer: Self-pay | Admitting: *Deleted

## 2019-09-10 ENCOUNTER — Other Ambulatory Visit: Payer: Self-pay

## 2019-09-10 ENCOUNTER — Encounter (HOSPITAL_COMMUNITY): Payer: Self-pay

## 2019-09-10 ENCOUNTER — Ambulatory Visit (HOSPITAL_COMMUNITY): Payer: Medicaid Other | Admitting: *Deleted

## 2019-09-10 ENCOUNTER — Ambulatory Visit (HOSPITAL_COMMUNITY)
Admission: RE | Admit: 2019-09-10 | Discharge: 2019-09-10 | Disposition: A | Discharge status: Home / Self Care | Payer: Medicaid Other | Source: Ambulatory Visit | Attending: Obstetrics and Gynecology | Admitting: Obstetrics and Gynecology

## 2019-09-10 DIAGNOSIS — O0990 Supervision of high risk pregnancy, unspecified, unspecified trimester: Secondary | ICD-10-CM | POA: Insufficient documentation

## 2019-09-10 DIAGNOSIS — O34219 Maternal care for unspecified type scar from previous cesarean delivery: Secondary | ICD-10-CM | POA: Diagnosis not present

## 2019-09-10 DIAGNOSIS — Z362 Encounter for other antenatal screening follow-up: Secondary | ICD-10-CM

## 2019-09-10 DIAGNOSIS — Z8759 Personal history of other complications of pregnancy, childbirth and the puerperium: Secondary | ICD-10-CM | POA: Insufficient documentation

## 2019-09-10 DIAGNOSIS — O09292 Supervision of pregnancy with other poor reproductive or obstetric history, second trimester: Secondary | ICD-10-CM | POA: Diagnosis not present

## 2019-09-10 DIAGNOSIS — O09212 Supervision of pregnancy with history of pre-term labor, second trimester: Secondary | ICD-10-CM

## 2019-09-10 DIAGNOSIS — Z3A26 26 weeks gestation of pregnancy: Secondary | ICD-10-CM

## 2019-09-10 DIAGNOSIS — O09899 Supervision of other high risk pregnancies, unspecified trimester: Secondary | ICD-10-CM

## 2019-09-11 ENCOUNTER — Encounter: Payer: Self-pay | Admitting: *Deleted

## 2019-09-13 ENCOUNTER — Other Ambulatory Visit: Payer: Self-pay

## 2019-09-13 ENCOUNTER — Encounter (HOSPITAL_COMMUNITY): Payer: Self-pay

## 2019-09-13 ENCOUNTER — Inpatient Hospital Stay (HOSPITAL_COMMUNITY)
Admission: AD | Admit: 2019-09-13 | Discharge: 2019-09-14 | Disposition: A | Discharge status: Home / Self Care | Payer: Medicaid Other | Attending: Obstetrics & Gynecology | Admitting: Obstetrics & Gynecology

## 2019-09-13 DIAGNOSIS — Z87891 Personal history of nicotine dependence: Secondary | ICD-10-CM | POA: Insufficient documentation

## 2019-09-13 DIAGNOSIS — O212 Late vomiting of pregnancy: Secondary | ICD-10-CM | POA: Diagnosis not present

## 2019-09-13 DIAGNOSIS — O99892 Other specified diseases and conditions complicating childbirth: Secondary | ICD-10-CM

## 2019-09-13 DIAGNOSIS — R030 Elevated blood-pressure reading, without diagnosis of hypertension: Secondary | ICD-10-CM

## 2019-09-13 DIAGNOSIS — G44209 Tension-type headache, unspecified, not intractable: Secondary | ICD-10-CM | POA: Diagnosis not present

## 2019-09-13 DIAGNOSIS — O26892 Other specified pregnancy related conditions, second trimester: Secondary | ICD-10-CM | POA: Diagnosis present

## 2019-09-13 DIAGNOSIS — J45909 Unspecified asthma, uncomplicated: Secondary | ICD-10-CM | POA: Diagnosis not present

## 2019-09-13 DIAGNOSIS — Z3A26 26 weeks gestation of pregnancy: Secondary | ICD-10-CM | POA: Diagnosis not present

## 2019-09-13 DIAGNOSIS — O162 Unspecified maternal hypertension, second trimester: Secondary | ICD-10-CM | POA: Diagnosis not present

## 2019-09-13 DIAGNOSIS — Z7982 Long term (current) use of aspirin: Secondary | ICD-10-CM | POA: Diagnosis not present

## 2019-09-13 DIAGNOSIS — Z8759 Personal history of other complications of pregnancy, childbirth and the puerperium: Secondary | ICD-10-CM

## 2019-09-13 DIAGNOSIS — R519 Headache, unspecified: Secondary | ICD-10-CM | POA: Insufficient documentation

## 2019-09-13 DIAGNOSIS — O99512 Diseases of the respiratory system complicating pregnancy, second trimester: Secondary | ICD-10-CM | POA: Insufficient documentation

## 2019-09-13 LAB — CBC
HCT: 29.9 % — ABNORMAL LOW (ref 36.0–46.0)
Hemoglobin: 10.5 g/dL — ABNORMAL LOW (ref 12.0–15.0)
MCH: 30.6 pg (ref 26.0–34.0)
MCHC: 35.1 g/dL (ref 30.0–36.0)
MCV: 87.2 fL (ref 80.0–100.0)
Platelets: 198 10*3/uL (ref 150–400)
RBC: 3.43 MIL/uL — ABNORMAL LOW (ref 3.87–5.11)
RDW: 12.4 % (ref 11.5–15.5)
WBC: 10.5 10*3/uL (ref 4.0–10.5)
nRBC: 0 % (ref 0.0–0.2)

## 2019-09-13 LAB — URINALYSIS, ROUTINE W REFLEX MICROSCOPIC
Bilirubin Urine: NEGATIVE
Glucose, UA: NEGATIVE mg/dL
Hgb urine dipstick: NEGATIVE
Ketones, ur: NEGATIVE mg/dL
Leukocytes,Ua: NEGATIVE
Nitrite: NEGATIVE
Protein, ur: NEGATIVE mg/dL
Specific Gravity, Urine: 1.002 — ABNORMAL LOW (ref 1.005–1.030)
pH: 8 (ref 5.0–8.0)

## 2019-09-13 MED ORDER — LACTATED RINGERS IV SOLN
INTRAVENOUS | Status: DC
  Administered 2019-09-13: via INTRAVENOUS

## 2019-09-13 MED ORDER — BUTALBITAL-APAP-CAFFEINE 50-325-40 MG PO TABS
2.0000 | ORAL_TABLET | Freq: Once | ORAL | Status: AC
  Administered 2019-09-13: 2 via ORAL
  Filled 2019-09-13: qty 2

## 2019-09-13 NOTE — MAU Note (Signed)
Patient presents to MAU c/o "I just haven't been feeling good" Patient reports feeling light headed, mild headache, and vomiting.  Patient denies taking anything for HA. Patient reports vomiting 3-4 times today.  +FM, "just not as active as normal" denies vaginal bleeding and lof.

## 2019-09-13 NOTE — MAU Provider Note (Signed)
Patient Stacy Christian is a 28 y.o. [phone] At 42w5dhere with complaints of dizziness, nausea, vomiting and thinking she is dehydrated. She endorses a headache that she rates a 6/10.   She denies decreased fetal movements, LOF, vaginal bleeding, vaginal discharge, dysuria, and any other ob-gyn related complaints.   She has a history of gestational hypertension; IUGR with AEDF; PTL and C/section x 2, including classical incision.  History     CSN: [id number] Arrival date and time: 09/13/19 2113   First Provider Initiated Contact with Patient 09/13/19 2312      Chief Complaint  Patient presents with  . Headache   Headache  This is a new problem. Episode onset: started today at noon. The problem has been gradually improving. The pain is located in the frontal region. The pain is at a severity of 4/10. Associated symptoms include vomiting. Pertinent negatives include no blurred vision or nausea. Nothing aggravates the symptoms.   Patient states that she started feeling bad around "8 or 9" am; she tried to rest all day. She couldn't eat today, she threw up 3-4 times at home and once in MAU. She thinks it was because her blood pressure was high but she didn't take her blood pressure at home.   OB History    Gravida  4   Para  2   Term      Preterm  2   AB  1   Living  1     SAB  1   TAB      Ectopic      Multiple      Live Births  2           Past Medical History:  Diagnosis Date  . Asthma   . Preterm labor     Past Surgical History:  Procedure Laterality Date  . CESAREAN SECTION    . TONSILLECTOMY      Family History  Problem Relation Age of Onset  . Diabetes Mother   . Hypertension Mother   . Heart disease Mother   . Diabetes Father   . Hypertension Father   . Heart disease Father   . Diabetes Maternal Grandmother   . Cancer Maternal Grandfather   . Heart disease Paternal Grandmother   . Heart disease Paternal Grandfather     Social  History   Tobacco Use  . Smoking status: Former Smoker    Packs/day: 0.15    Types: Cigarettes    Quit date: 05/23/2019    Years since quitting: 0.3  . Smokeless tobacco: Never Used  Substance Use Topics  . Alcohol use: No  . Drug use: Not Currently    Frequency: 3.0 times per week    Types: Marijuana    Comment: last used end July    Allergies: No Known Allergies  Medications Prior to Admission  Medication Sig Dispense Refill Last Dose  . aspirin EC 81 MG tablet Take 1 tablet (81 mg total) by mouth daily. (Patient not taking: Reported on 08/28/2019) 100 tablet 3   . Blood Pressure Monitoring (BLOOD PRESSURE KIT) DEVI 1 Device by Does not apply route as needed. ICD 10:  O09.90 1 Device 0   . Prenatal Vit-Fe Fumarate-FA (PRENATAL VITAMINS) 28-0.8 MG TABS Take 1 tablet by mouth daily. 30 tablet 6     Review of Systems  Constitutional: Negative.   HENT: Negative.   Eyes: Negative for blurred vision.  Gastrointestinal: Positive for vomiting. Negative for nausea.  Neurological: Positive for headaches.   Physical Exam   Blood pressure (!) 144/89, pulse 72, temperature 98.4 F (36.9 C), temperature source Oral, resp. rate 17, height '5\' 6"'  (1.676 m), weight 87.3 kg, last menstrual period 03/10/2019, unknown if currently breastfeeding.  Physical Exam  Constitutional: She appears well-developed and well-nourished.  HENT:  Head: Normocephalic.  Neck: Normal range of motion.  Respiratory: Effort normal.  GI: Soft.  Genitourinary:    Vagina normal.   Skin: Skin is warm.    MAU Course  Procedures  MDM -NST: 135 bpm, mod var, present acel, neg decels, no contractions.  -pre-e labs are normal -continue to watch BPs while in MAU.   Patient Vitals for the past 24 hrs:  BP Temp Temp src Pulse Resp SpO2 Height Weight  09/14/19 0054 123/82 - - 67 - - - -  09/13/19 2346 129/86 - - 71 - - - -  09/13/19 2331 128/86 - - 77 - - - -  09/13/19 2325 135/90 - - 78 - 99 % - -  09/13/19  2215 (!) 144/89 - - 72 - - - -  09/13/19 2144 (!) 136/93 98.4 F (36.9 C) Oral 74 17 - '5\' 6"'  (1.676 m) 87.3 kg    -UA shows no signs of dehydration -fioriecet for headache, no relief will try headache cocktail. Now HA is a 2/10.   Assessment and Plan   1. Acute non intractable tension-type headache    2. Bps were slightly elevated in MAU. Message sent to clinic for patient have BP check with RN over the phone this week.   3. Patient to check BP if she starts feeling symptomatic.   4. Strict pre-e precautions given; patient verbalized understanding.   Mervyn Skeeters Mahum Betten 09/13/2019, 11:18 PM

## 2019-09-13 NOTE — MAU Note (Signed)
Rn at bedside continuously adjusting FHR monitor.

## 2019-09-14 LAB — COMPREHENSIVE METABOLIC PANEL
ALT: 9 U/L (ref 0–44)
AST: 14 U/L — ABNORMAL LOW (ref 15–41)
Albumin: 3 g/dL — ABNORMAL LOW (ref 3.5–5.0)
Alkaline Phosphatase: 67 U/L (ref 38–126)
Anion gap: 9 (ref 5–15)
BUN: 5 mg/dL — ABNORMAL LOW (ref 6–20)
CO2: 22 mmol/L (ref 22–32)
Calcium: 8.5 mg/dL — ABNORMAL LOW (ref 8.9–10.3)
Chloride: 105 mmol/L (ref 98–111)
Creatinine, Ser: 0.57 mg/dL (ref 0.44–1.00)
GFR calc Af Amer: >60 mL/min (ref >60)
GFR calc non Af Amer: >60 mL/min (ref >60)
Glucose, Bld: 72 mg/dL (ref 70–99)
Potassium: 3.5 mmol/L (ref 3.5–5.1)
Sodium: 136 mmol/L (ref 135–145)
Total Bilirubin: 0.5 mg/dL (ref 0.3–1.2)
Total Protein: 6.6 g/dL (ref 6.5–8.1)

## 2019-09-14 LAB — PROTEIN / CREATININE RATIO, URINE
Creatinine, Urine: 15.16 mg/dL
Total Protein, Urine: <6 mg/dL

## 2019-09-14 MED ORDER — METOCLOPRAMIDE HCL 5 MG/ML IJ SOLN
10.0000 mg | Freq: Once | INTRAMUSCULAR | Status: AC
  Administered 2019-09-14: 01:00:00 10 mg via INTRAVENOUS
  Filled 2019-09-14: qty 2

## 2019-09-14 MED ORDER — ASPIRIN-ACETAMINOPHEN-CAFFEINE 250-250-65 MG PO TABS: 2.0000 | ORAL_TABLET | Freq: Once | ORAL | Status: DC

## 2019-09-14 MED ORDER — DEXAMETHASONE SODIUM PHOSPHATE 10 MG/ML IJ SOLN
10.0000 mg | Freq: Once | INTRAMUSCULAR | Status: AC
  Administered 2019-09-14: 10 mg via INTRAVENOUS
  Filled 2019-09-14: qty 1

## 2019-09-14 MED ORDER — DIPHENHYDRAMINE HCL 50 MG/ML IJ SOLN
12.5000 mg | Freq: Once | INTRAMUSCULAR | Status: AC
  Administered 2019-09-14: 12.5 mg via INTRAVENOUS
  Filled 2019-09-14: qty 1

## 2019-09-14 NOTE — Discharge Instructions (Signed)
Hypertension During Pregnancy °Hypertension is also called high blood pressure. High blood pressure means that the force of your blood moving in your body is too strong. It can cause problems for you and your baby. Different types of high blood pressure can happen during pregnancy. The types are: °· High blood pressure before you got pregnant. This is called chronic hypertension.  This can continue during your pregnancy. Your doctor will want to keep checking your blood pressure. You may need medicine to keep your blood pressure under control while you are pregnant. You will need follow-up visits after you have your baby. °· High blood pressure that goes up during pregnancy when it was normal before. This is called gestational hypertension. It will usually get better after you have your baby, but your doctor will need to watch your blood pressure to make sure that it is getting better. °· Very high blood pressure during pregnancy. This is called preeclampsia. Very high blood pressure is an emergency that needs to be checked and treated right away. °· You may develop very high blood pressure after giving birth. This is called postpartum preeclampsia. This usually occurs within 48 hours after childbirth but may occur up to 6 weeks after giving birth. This is rare. °How does this affect me? °If you have high blood pressure during pregnancy, you have a higher chance of developing high blood pressure: °· As you get older. °· If you get pregnant again. °In some cases, high blood pressure during pregnancy can cause: °· Stroke. °· Heart attack. °· Damage to the kidneys, lungs, or liver. °· Preeclampsia. °· Jerky movements you cannot control (convulsions or seizures). °· Problems with the placenta. °How does this affect my baby? °Your baby may: °· Be born early. °· Not weigh as much as he or she should. °· Not handle labor well, leading to a c-section birth. °What are the risks? °· Having high blood pressure during a past  pregnancy. °· Being overweight. °· Being 35 years old or older. °· Being pregnant for the first time. °· Being pregnant with more than one baby. °· Becoming pregnant using fertility methods, such as IVF. °· Having other problems, such as diabetes, or kidney disease. °· Having family members who have high blood pressure. °What can I do to lower my risk? ° °· Keep a healthy weight. °· Eat a healthy diet. °· Follow what your doctor tells you about treating any medical problems that you had before becoming pregnant. °It is very important to go to all of your doctor visits. Your doctor will check your blood pressure and make sure that your pregnancy is progressing as it should. Treatment should start early if a problem is found. °How is this treated? °Treatment for high blood pressure during pregnancy can differ depending on the type of high blood pressure you have and how serious it is. °· You may need to take blood pressure medicine. °· If you have been taking medicine for your blood pressure, you may need to change the medicine during pregnancy if it is not safe for your baby. °· If your doctor thinks that you could get very high blood pressure, he or she may tell you to take a low-dose aspirin during your pregnancy. °· If you have very high blood pressure, you may need to stay in the hospital so you and your baby can be watched closely. You may also need to take medicine to lower your blood pressure. This medicine may be given by mouth   or through an IV tube. °· In some cases, if your condition gets worse, you may need to have your baby early. °Follow these instructions at home: °Eating and drinking ° °· Drink enough fluid to keep your pee (urine) pale yellow. °· Avoid caffeine. °Lifestyle °· Do not use any products that contain nicotine or tobacco, such as cigarettes, e-cigarettes, and chewing tobacco. If you need help quitting, ask your doctor. °· Do not use alcohol or drugs. °· Avoid stress. °· Rest and get plenty  of sleep. °· Regular exercise can help. Ask your doctor what kinds of exercise are best for you. °General instructions °· Take over-the-counter and prescription medicines only as told by your doctor. °· Keep all prenatal and follow-up visits as told by your doctor. This is important. °Contact a doctor if: °· You have symptoms that your doctor told you to watch for, such as: °? Headaches. °? Nausea. °? Vomiting. °? Belly (abdominal) pain. °? Dizziness. °? Light-headedness. °Get help right away if: °· You have: °? Very bad belly pain that does not get better with treatment. °? A very bad headache that does not get better. °? Vomiting that does not get better. °? Sudden, fast weight gain. °? Sudden swelling in your hands, ankles, or face. °? Bleeding from your vagina. °? Blood in your pee. °? Blurry vision. °? Double vision. °? Shortness of breath. °? Chest pain. °? Weakness on one side of your body. °? Trouble talking. °· Your baby is not moving as much as usual. °Summary °· High blood pressure is also called hypertension. °· High blood pressure means that the force of your blood moving in your body is too strong. °· High blood pressure can cause problems for you and your baby. °· Keep all follow-up visits as told by your doctor. This is important. °This information is not intended to replace advice given to you by your health care provider. Make sure you discuss any questions you have with your health care provider. °Document Released: 12/23/2010 Document Revised: 03/13/2019 Document Reviewed: 12/17/2018 °Elsevier Patient Education © 2020 [employer] ° °

## 2019-09-22 ENCOUNTER — Telehealth: Payer: Self-pay | Admitting: Obstetrics & Gynecology

## 2019-09-22 NOTE — Telephone Encounter (Signed)
Called the patient to inform of the upcoming mychart visit. The patient is aware of how to log in and agreed to the attend the appointment. °

## 2019-09-23 ENCOUNTER — Other Ambulatory Visit: Payer: Self-pay

## 2019-09-23 ENCOUNTER — Ambulatory Visit: Payer: Medicaid Other

## 2019-09-23 DIAGNOSIS — Z0130 Encounter for examination of blood pressure without abnormal findings: Secondary | ICD-10-CM

## 2019-09-23 NOTE — Progress Notes (Signed)
Called pt @ [phone] and apologized for calling late for her BP check appt.  If she could please give the office a call.   Attempted to contact pt x 3 and seemed like someone would pick up the phone and not knowing that they had picked up the phone and would never say anything.    Called pt last attempt @ [phone] and LM that I have made several attempts that have been unsuccessful if she could please call the office to reschedule her appt.    Mel Almond, RN 09/23/19

## 2019-09-24 ENCOUNTER — Telehealth: Payer: Self-pay | Admitting: Emergency Medicine

## 2019-09-24 NOTE — Telephone Encounter (Signed)
Pt called and left a message on the nurse voicemail line requesting a call back.   Pt call returned and pt stated that she missed her BP nurse visit but her mychart app makes it appear that she completed the visit. Pt was informed that according to the nurse note, she was unable to reach the pt by phone and left a voicemail for the pt to call and reschedule. Pt reports that her cuff is not working properly. Pt informed that the front office will call her to reschedule an in person nurse visit and for her to bring her blood pressure cuff with her to be checked. Pt verbalized understanding and had no further questions or concerns. Message sent to front office to pt regarding rescheduling.

## 2019-09-25 ENCOUNTER — Telehealth (INDEPENDENT_AMBULATORY_CARE_PROVIDER_SITE_OTHER): Payer: Medicaid Other | Admitting: *Deleted

## 2019-09-25 ENCOUNTER — Other Ambulatory Visit: Payer: Self-pay

## 2019-09-25 ENCOUNTER — Encounter: Payer: Self-pay | Admitting: *Deleted

## 2019-09-25 DIAGNOSIS — O09293 Supervision of pregnancy with other poor reproductive or obstetric history, third trimester: Secondary | ICD-10-CM

## 2019-09-25 DIAGNOSIS — O0990 Supervision of high risk pregnancy, unspecified, unspecified trimester: Secondary | ICD-10-CM

## 2019-09-25 DIAGNOSIS — O09899 Supervision of other high risk pregnancies, unspecified trimester: Secondary | ICD-10-CM

## 2019-09-25 DIAGNOSIS — Z8759 Personal history of other complications of pregnancy, childbirth and the puerperium: Secondary | ICD-10-CM

## 2019-09-25 NOTE — Progress Notes (Signed)
I called Cierah for her visit . I connected with  [name] on 09/25/19 at 10:00 AM EDT by telephone and verified that I am speaking with the correct person using two identifiers.   I discussed the limitations, risks, security and privacy concerns of performing an evaluation and management service by telephone and the availability of in person appointments. I also discussed with the patient that there may be a patient responsible charge related to this service. The patient expressed understanding and agreed to proceed. She asked if could change visit to in person this afternoon; but I informed her we cannont due to no appointment availability but we can continue with doing a virtual visit. Today. She agreed.  She reports she has gotten the 17p but has not started giving her shots because she hasn't been shown how yet. She also confirms her blood pressure cuff is not working correctly. She states it was working properly at first but then deflated in the middle of taking bp and Laurian Brim' give her a reading.  Mekiah and I attempted to connect virtually several times but were unsuccessfull. We discussed we need to have in person visit to teach her to do her shots and to see the bp cuff. I offered her first available of Monday 10/26 at 0830. She accepted and will bring her Makena and her bp cuff.  Linda,RN 09/25/2019  10:20 AM

## 2019-09-29 ENCOUNTER — Ambulatory Visit: Payer: Medicaid Other

## 2019-10-01 ENCOUNTER — Other Ambulatory Visit: Payer: Self-pay

## 2019-10-01 ENCOUNTER — Ambulatory Visit (INDEPENDENT_AMBULATORY_CARE_PROVIDER_SITE_OTHER): Payer: Medicaid Other | Admitting: Obstetrics and Gynecology

## 2019-10-01 ENCOUNTER — Other Ambulatory Visit (HOSPITAL_COMMUNITY): Payer: Self-pay | Admitting: *Deleted

## 2019-10-01 ENCOUNTER — Encounter (HOSPITAL_COMMUNITY): Payer: Self-pay

## 2019-10-01 ENCOUNTER — Ambulatory Visit (HOSPITAL_COMMUNITY)
Admission: RE | Admit: 2019-10-01 | Discharge: 2019-10-01 | Disposition: A | Discharge status: Home / Self Care | Payer: Medicaid Other | Source: Ambulatory Visit | Attending: Obstetrics and Gynecology | Admitting: Obstetrics and Gynecology

## 2019-10-01 ENCOUNTER — Encounter: Payer: Self-pay | Admitting: Obstetrics and Gynecology

## 2019-10-01 ENCOUNTER — Ambulatory Visit (HOSPITAL_COMMUNITY): Payer: Medicaid Other | Admitting: *Deleted

## 2019-10-01 VITALS — BP 144/95 | HR 76 | Wt 194.9 lb

## 2019-10-01 DIAGNOSIS — O09293 Supervision of pregnancy with other poor reproductive or obstetric history, third trimester: Secondary | ICD-10-CM | POA: Diagnosis not present

## 2019-10-01 DIAGNOSIS — Z8759 Personal history of other complications of pregnancy, childbirth and the puerperium: Secondary | ICD-10-CM

## 2019-10-01 DIAGNOSIS — O133 Gestational [pregnancy-induced] hypertension without significant proteinuria, third trimester: Secondary | ICD-10-CM | POA: Diagnosis not present

## 2019-10-01 DIAGNOSIS — O139 Gestational [pregnancy-induced] hypertension without significant proteinuria, unspecified trimester: Secondary | ICD-10-CM | POA: Insufficient documentation

## 2019-10-01 DIAGNOSIS — Z362 Encounter for other antenatal screening follow-up: Secondary | ICD-10-CM | POA: Diagnosis not present

## 2019-10-01 DIAGNOSIS — O09213 Supervision of pregnancy with history of pre-term labor, third trimester: Secondary | ICD-10-CM | POA: Diagnosis not present

## 2019-10-01 DIAGNOSIS — O0990 Supervision of high risk pregnancy, unspecified, unspecified trimester: Secondary | ICD-10-CM | POA: Insufficient documentation

## 2019-10-01 DIAGNOSIS — Z98891 History of uterine scar from previous surgery: Secondary | ICD-10-CM

## 2019-10-01 DIAGNOSIS — O09899 Supervision of other high risk pregnancies, unspecified trimester: Secondary | ICD-10-CM | POA: Diagnosis not present

## 2019-10-01 DIAGNOSIS — Z3A29 29 weeks gestation of pregnancy: Secondary | ICD-10-CM | POA: Diagnosis not present

## 2019-10-01 DIAGNOSIS — O34219 Maternal care for unspecified type scar from previous cesarean delivery: Secondary | ICD-10-CM

## 2019-10-01 DIAGNOSIS — O0993 Supervision of high risk pregnancy, unspecified, third trimester: Secondary | ICD-10-CM

## 2019-10-01 NOTE — Addendum Note (Signed)
Addended by: Bethanne Ginger on: 10/01/2019 11:49 AM   Modules accepted: Orders

## 2019-10-01 NOTE — Progress Notes (Signed)
   PRENATAL VISIT NOTE  Subjective:  Stacy Christian is a 28 y.o. [phone] at [redacted]w[redacted]d being seen today for ongoing prenatal care.  She is currently monitored for the following issues for this high-risk pregnancy and has History of premature delivery, currently pregnant; History of classical cesarean section; Supervision of high risk pregnancy, antepartum; History of gestational hypertension; Asthma; History of neonatal death; and Gestational hypertension on their problem list.  Patient reports no complaints.  Contractions: Not present. Vag. Bleeding: None.  Movement: Present. Denies leaking of fluid.   The following portions of the patient's history were reviewed and updated as appropriate: allergies, current medications, past family history, past medical history, past social history, past surgical history and problem list.   Objective:   Vitals:   10/01/19 1011 10/01/19 1016  BP: (!) 140/93 (!) 144/95  Pulse: 72 76  Weight: 194 lb 14.4 oz (88.4 kg)     Fetal Status: Fetal Heart Rate (bpm): 130   Movement: Present     General:  Alert, oriented and cooperative. Patient is in no acute distress.  Skin: Skin is warm and dry. No rash noted.   Cardiovascular: Normal heart rate noted  Respiratory: Normal respiratory effort, no problems with respiration noted  Abdomen: Soft, gravid, appropriate for gestational age.  Pain/Pressure: Absent     Pelvic: Cervical exam deferred        Extremities: Normal range of motion.  Edema: Trace  Mental Status: Normal mood and affect. Normal behavior. Normal judgment and thought content.   Assessment and Plan:  Pregnancy: [id number] at [redacted]w[redacted]d  1. Supervision of high risk pregnancy, antepartum - CBC - Glucose Tolerance, 2 Hours w/1 Hour - HIV Antibody (routine testing w rflx) - RPR - Tdap vaccine greater than or equal to 7yo IM  2. History of gestational hypertension Have not been taking baby ASA Has been unable to afford Will start today  3. History  of neonatal death After classical CS done at 24 weeks  4. History of classical cesarean section RCS scheduled for 37 weeks today Reviewed need for early CS, patient verbalizes understanding and importance of presenting to MAU with contractions  5. Gestational hypertension, third trimester Elevated BP today, will CMP, UPCr, CBC   Preterm labor symptoms and general obstetric precautions including but not limited to vaginal bleeding, contractions, leaking of fluid and fetal movement were reviewed in detail with the patient. Please refer to After Visit Summary for other counseling recommendations.   Return in about 2 weeks (around 10/15/2019) for high OB, in person.  No future appointments.  Sloan Leiter, MD

## 2019-10-02 LAB — COMPREHENSIVE METABOLIC PANEL
ALT: 5 IU/L (ref 0–32)
AST: 13 IU/L (ref 0–40)
Albumin/Globulin Ratio: 1.3 (ref 1.2–2.2)
Albumin: 3.6 g/dL — ABNORMAL LOW (ref 3.9–5.0)
Alkaline Phosphatase: 89 IU/L (ref 39–117)
BUN/Creatinine Ratio: 9 (ref 9–23)
BUN: 5 mg/dL — ABNORMAL LOW (ref 6–20)
Bilirubin Total: <0.2 mg/dL (ref 0.0–1.2)
CO2: 22 mmol/L (ref 20–29)
Calcium: 8.2 mg/dL — ABNORMAL LOW (ref 8.7–10.2)
Chloride: 104 mmol/L (ref 96–106)
Creatinine, Ser: 0.53 mg/dL — ABNORMAL LOW (ref 0.57–1.00)
GFR calc Af Amer: 149 mL/min/{1.73_m2} (ref >59)
GFR calc non Af Amer: 130 mL/min/{1.73_m2} (ref >59)
Globulin, Total: 2.8 g/dL (ref 1.5–4.5)
Glucose: 67 mg/dL (ref 65–99)
Potassium: 3.6 mmol/L (ref 3.5–5.2)
Sodium: 136 mmol/L (ref 134–144)
Total Protein: 6.4 g/dL (ref 6.0–8.5)

## 2019-10-02 LAB — PROTEIN / CREATININE RATIO, URINE
Creatinine, Urine: 114.4 mg/dL
Protein, Ur: 11.3 mg/dL
Protein/Creat Ratio: 99 mg/g creat (ref 0–200)

## 2019-10-02 LAB — CBC
Hematocrit: 30.5 % — ABNORMAL LOW (ref 34.0–46.6)
Hemoglobin: 10.3 g/dL — ABNORMAL LOW (ref 11.1–15.9)
MCH: 30.2 pg (ref 26.6–33.0)
MCHC: 33.8 g/dL (ref 31.5–35.7)
MCV: 89 fL (ref 79–97)
Platelets: 194 10*3/uL (ref 150–450)
RBC: 3.41 x10E6/uL — ABNORMAL LOW (ref 3.77–5.28)
RDW: 12.4 % (ref 11.7–15.4)
WBC: 9 10*3/uL (ref 3.4–10.8)

## 2019-10-02 LAB — GLUCOSE TOLERANCE, 2 HOURS W/ 1HR
Glucose, 1 hour: 100 mg/dL (ref 65–179)
Glucose, 2 hour: 81 mg/dL (ref 65–152)
Glucose, Fasting: 68 mg/dL (ref 65–91)

## 2019-10-02 LAB — HIV ANTIBODY (ROUTINE TESTING W REFLEX): HIV Screen 4th Generation wRfx: NONREACTIVE

## 2019-10-02 LAB — RPR: RPR Ser Ql: NONREACTIVE

## 2019-10-15 ENCOUNTER — Other Ambulatory Visit: Payer: Self-pay

## 2019-10-15 ENCOUNTER — Encounter: Payer: Self-pay | Admitting: Obstetrics & Gynecology

## 2019-10-15 ENCOUNTER — Ambulatory Visit (INDEPENDENT_AMBULATORY_CARE_PROVIDER_SITE_OTHER): Payer: Medicaid Other | Admitting: Obstetrics & Gynecology

## 2019-10-15 VITALS — BP 121/76 | HR 96 | Wt 199.9 lb

## 2019-10-15 DIAGNOSIS — Z23 Encounter for immunization: Secondary | ICD-10-CM | POA: Diagnosis not present

## 2019-10-15 DIAGNOSIS — O133 Gestational [pregnancy-induced] hypertension without significant proteinuria, third trimester: Secondary | ICD-10-CM | POA: Diagnosis not present

## 2019-10-15 DIAGNOSIS — O0993 Supervision of high risk pregnancy, unspecified, third trimester: Secondary | ICD-10-CM | POA: Diagnosis not present

## 2019-10-15 DIAGNOSIS — Z98891 History of uterine scar from previous surgery: Secondary | ICD-10-CM

## 2019-10-15 DIAGNOSIS — O0990 Supervision of high risk pregnancy, unspecified, unspecified trimester: Secondary | ICD-10-CM

## 2019-10-15 NOTE — Patient Instructions (Signed)
Return to office for any scheduled appointments. Call the office or go to the MAU at [facility] if:  You begin to have strong, frequent contractions  Your water breaks.  Sometimes it is a big gush of fluid, sometimes it is just a trickle that keeps getting your panties wet or running down your legs  You have vaginal bleeding.  It is normal to have a small amount of spotting if your cervix was checked.   You do not feel your baby moving like normal.  If you do not, get something to eat and drink and lay down and focus on feeling your baby move.   If your baby is still not moving like normal, you should call the office or go to MAU.  Any other obstetric concerns.   

## 2019-10-15 NOTE — Progress Notes (Signed)
   PRENATAL VISIT NOTE  Subjective:  Stacy Christian is a 28 y.o. [phone] at [redacted]w[redacted]d being seen today for ongoing prenatal care.  She is currently monitored for the following issues for this high-risk pregnancy and has History of premature delivery, currently pregnant; History of classical cesarean section; Supervision of high risk pregnancy, antepartum; History of gestational hypertension; Asthma; History of neonatal death; and Gestational hypertension on their problem list.  Patient reports no complaints.  Contractions: Not present. Vag. Bleeding: None.  Movement: Present. Denies leaking of fluid.   The following portions of the patient's history were reviewed and updated as appropriate: allergies, current medications, past family history, past medical history, past social history, past surgical history and problem list.   Objective:   Vitals:   10/15/19 0930  BP: 121/76  Pulse: 96  Weight: 199 lb 14.4 oz (90.7 kg)    Fetal Status: Fetal Heart Rate (bpm): 143   Movement: Present     General:  Alert, oriented and cooperative. Patient is in no acute distress.  Skin: Skin is warm and dry. No rash noted.   Cardiovascular: Normal heart rate noted  Respiratory: Normal respiratory effort, no problems with respiration noted  Abdomen: Soft, gravid, appropriate for gestational age.  Pain/Pressure: Absent     Pelvic: Cervical exam deferred        Extremities: Normal range of motion.  Edema: Trace  Mental Status: Normal mood and affect. Normal behavior. Normal judgment and thought content.   Assessment and Plan:  Pregnancy: [id number] at [redacted]w[redacted]d 1. Gestational hypertension, third trimester Stable BP, normal labs last visit.  Will start weekly antenatal testing next week until delivery.  Growth scan already scheduled at [facility] on 10/29/19, will add on BPP to that scan. Preeclampsia precautions reviewed.  - [place] [facility] FETAL BPP WO NON STRESS; Future  2. History of classical cesarean section RCS  scheduled at 37 weeks.  3. Supervision of high risk pregnancy, antepartum Tdap vaccine given. - Tdap vaccine greater than or equal to 7yo IM Preterm labor symptoms and general obstetric precautions including but not limited to vaginal bleeding, contractions, leaking of fluid and fetal movement were reviewed in detail with the patient. Please refer to After Visit Summary for other counseling recommendations.   Return in about 1 week (around 10/22/2019) for NST, BPP, OFFICE HOB Visit (will need weekly testing until scheduled delivery at 37 weeks).  Future Appointments  Date Time Provider [place]  10/23/2019 10:15 AM [facility] NST [facility] WOC  10/23/2019 11:15 AM Chancy Milroy, MD [facility] WOC  10/28/2019 11:15 AM Fair, Marin Shutter, MD WOC-WOCA WOC  10/29/2019 12:45 PM [place] NURSE [place] [facility]  10/29/2019 12:45 PM [place] 5 WH-MFCUS MFC-US  11/05/2019  8:15 AM WOC-WOCA NST WOC-WOCA WOC  11/05/2019  9:15 AM Aletha Halim, MD WOC-WOCA WOC  11/12/2019  8:15 AM WOC-WOCA NST WOC-WOCA WOC  11/12/2019  9:15 AM Aletha Halim, MD [place] WOC  11/19/2019  8:15 AM WOC-WOCA NST WOC-WOCA WOC  11/19/2019  9:15 AM Truett Mainland, DO [place]  11/26/2019  8:15 AM WOC-WOCA NST WOC-WOCA WOC  11/26/2019  9:15 AM Aletha Halim, MD WOC-WOCA WOC  12/03/2019  8:15 AM WOC-WOCA NST WOC-WOCA WOC    Verita Schneiders, MD

## 2019-10-21 ENCOUNTER — Encounter (HOSPITAL_COMMUNITY): Payer: Self-pay | Admitting: *Deleted

## 2019-10-21 ENCOUNTER — Inpatient Hospital Stay (HOSPITAL_COMMUNITY)
Admission: AD | Admit: 2019-10-21 | Discharge: 2019-10-22 | Disposition: A | Discharge status: Home / Self Care | Payer: Medicaid Other | Attending: Family Medicine | Admitting: Family Medicine

## 2019-10-21 DIAGNOSIS — W19XXXA Unspecified fall, initial encounter: Secondary | ICD-10-CM

## 2019-10-21 DIAGNOSIS — Z87891 Personal history of nicotine dependence: Secondary | ICD-10-CM | POA: Insufficient documentation

## 2019-10-21 DIAGNOSIS — Z3A32 32 weeks gestation of pregnancy: Secondary | ICD-10-CM | POA: Insufficient documentation

## 2019-10-21 DIAGNOSIS — O26893 Other specified pregnancy related conditions, third trimester: Secondary | ICD-10-CM | POA: Insufficient documentation

## 2019-10-21 DIAGNOSIS — Z3689 Encounter for other specified antenatal screening: Secondary | ICD-10-CM

## 2019-10-21 DIAGNOSIS — R109 Unspecified abdominal pain: Secondary | ICD-10-CM | POA: Insufficient documentation

## 2019-10-21 DIAGNOSIS — O9A213 Injury, poisoning and certain other consequences of external causes complicating pregnancy, third trimester: Secondary | ICD-10-CM

## 2019-10-21 DIAGNOSIS — Z7982 Long term (current) use of aspirin: Secondary | ICD-10-CM | POA: Insufficient documentation

## 2019-10-21 DIAGNOSIS — W109XXA Fall (on) (from) unspecified stairs and steps, initial encounter: Secondary | ICD-10-CM | POA: Insufficient documentation

## 2019-10-21 DIAGNOSIS — Z8759 Personal history of other complications of pregnancy, childbirth and the puerperium: Secondary | ICD-10-CM

## 2019-10-21 DIAGNOSIS — O0990 Supervision of high risk pregnancy, unspecified, unspecified trimester: Secondary | ICD-10-CM

## 2019-10-21 NOTE — MAU Provider Note (Signed)
History     CSN: [id number]  Arrival date and time: 10/21/19 2013   First Provider Initiated Contact with Patient 10/21/19 2049      Chief Complaint  Patient presents with  . Abdominal Pain   Stacy Christian is a 28 y.o. G4P1 at 8w1dwho presents to MAU with complaints of abdominal pain. She reports she fell going up her stairs around 5-6am. She reports hitting her belly on the stairs but states "lightly tapped belly". Initially was not having any abdominal pain and feeling good movement so did not call the office. Around 1900 this evening started having occasional cramping that occurs every 30 minutes, rates cramping 3/10- has not taken any medication for cramping. Denies vaginal bleeding or discharge. Reports +FM.   OB History    Gravida  4   Para  2   Term      Preterm  2   AB  1   Living  1     SAB  1   TAB      Ectopic      Multiple      Live Births  2           Past Medical History:  Diagnosis Date  . Asthma   . Preterm labor     Past Surgical History:  Procedure Laterality Date  . CESAREAN SECTION    . TONSILLECTOMY      Family History  Problem Relation Age of Onset  . Diabetes Mother   . Hypertension Mother   . Heart disease Mother   . Diabetes Father   . Hypertension Father   . Heart disease Father   . Diabetes Maternal Grandmother   . Cancer Maternal Grandfather   . Heart disease Paternal Grandmother   . Heart disease Paternal Grandfather     Social History   Tobacco Use  . Smoking status: Former Smoker    Packs/day: 0.15    Types: Cigarettes    Quit date: 05/23/2019    Years since quitting: 0.4  . Smokeless tobacco: Never Used  Substance Use Topics  . Alcohol use: No  . Drug use: Yes    Frequency: 3.0 times per week    Types: Marijuana    Comment: 10/21/19 last use     Allergies: No Known Allergies  Medications Prior to Admission  Medication Sig Dispense Refill Last Dose  . aspirin EC 81 MG tablet Take 1 tablet  (81 mg total) by mouth daily. 100 tablet 3 10/21/2019 at Unknown time  . Prenatal Vit-Fe Fumarate-FA (PRENATAL VITAMINS) 28-0.8 MG TABS Take 1 tablet by mouth daily. 30 tablet 6 10/21/2019 at Unknown time  . Blood Pressure Monitoring (BLOOD PRESSURE KIT) DEVI 1 Device by Does not apply route as needed. ICD 10:  O09.90 1 Device 0 Unknown at Unknown time    Review of Systems  Constitutional: Negative.   Respiratory: Negative.   Cardiovascular: Negative.   Gastrointestinal: Positive for abdominal pain. Negative for constipation, diarrhea, nausea and vomiting.       Cramping, rates 3/10  Genitourinary: Negative.   Musculoskeletal: Negative.   Neurological: Negative.    FHR: 125/moderate/+accels/ no decelerations  [place]: no UC  Physical Exam   Blood pressure 131/89, pulse 82, temperature 99.1 F (37.3 C), temperature source Oral, resp. rate 20, height _0  (1.676 m), weight 90.1 kg, last menstrual period 03/10/2019, unknown if currently breastfeeding.  Physical Exam  Nursing note and vitals reviewed. Constitutional: She is oriented to person,  place, and time. She appears well-developed and well-nourished. No distress.  Cardiovascular: Normal rate and regular rhythm.  Respiratory: Effort normal and breath sounds normal. No respiratory distress. She has no wheezes.  GI: Soft. There is no abdominal tenderness. There is no rebound.  Gravid appropriate for gestational age, no contractions palpated   Genitourinary:    No vaginal discharge or bleeding.  No bleeding in the vagina.  Musculoskeletal: Normal range of motion.        General: No edema.  Neurological: She is alert and oriented to person, place, and time.  Psychiatric: She has a normal mood and affect. Her behavior is normal. Thought content normal.   Dilation: Fingertip Effacement (%): Thick Cervical Position: Posterior Exam by:: Wende Bushy CNM  MAU Course  Procedures  MDM  Consult with Dr Nehemiah Settle on patient assessment and  management - Dr Nehemiah Settle okay with 4 hour monitoring d/t fall being 12+ hours ago.   Extended monitoring for 4 hours  Hydration  Cervical examination- 0.5/thick/posterior   Reassessment after 2 hours - patient reports cramping has resolved, denies back pain, reports +FM- discussed with patient 2 more hours of monitoring then will be able to discharge home, patient verbalizes understanding.   Reassessment after completion of extended monitoring _0 - patient reports abdominal pain has not returned. Okay to discharge home- NST reactive and reassuring. Discussed reasons to return to MAU. Follow up as scheduled in the office. Return to MAU as needed. Pt stable at time of discharge.   Assessment and Plan   1. Fall, initial encounter   2. History of gestational hypertension   3. Supervision of high risk pregnancy, antepartum   4. [redacted] weeks gestation of pregnancy   5. NST (non-stress test) reactive    Discharge home Follow up as scheduled in the office for prenatal care Return to MAU as needed for reasons discussed and/or emergencies  NST reactive  Hydration and [place] for [facility] Follow up.   Specialty: Obstetrics and Gynecology Why: Follow up as scheduled for prenatal appointments  Contact information: [address], [place] [id number] [place] [id number] [phone]         Allergies as of 10/22/2019   No Known Allergies     Medication List    TAKE these medications   aspirin EC 81 MG tablet Take 1 tablet (81 mg total) by mouth daily.   Blood Pressure Kit Devi 1 Device by Does not apply route as needed. ICD 10:  O09.90   Prenatal Vitamins 28-0.8 MG Tabs Take 1 tablet by mouth daily.       Lajean Manes CNM 10/22/2019, 4:17 AM

## 2019-10-21 NOTE — MAU Note (Signed)
PT SAYS FELL UP THE STEPS AT 0600. [facility]  WITH CLINIC .  DID NOT CALL OFFICE .   STARTED FEELING ABD PAIN AT 5 PM .  FEELS BABY MOVE.

## 2019-10-22 ENCOUNTER — Telehealth: Payer: Self-pay | Admitting: Obstetrics and Gynecology

## 2019-10-22 DIAGNOSIS — O9A213 Injury, poisoning and certain other consequences of external causes complicating pregnancy, third trimester: Secondary | ICD-10-CM | POA: Diagnosis not present

## 2019-10-22 DIAGNOSIS — Z3A32 32 weeks gestation of pregnancy: Secondary | ICD-10-CM | POA: Diagnosis not present

## 2019-10-22 DIAGNOSIS — W19XXXA Unspecified fall, initial encounter: Secondary | ICD-10-CM | POA: Diagnosis not present

## 2019-10-22 DIAGNOSIS — O26893 Other specified pregnancy related conditions, third trimester: Secondary | ICD-10-CM | POA: Diagnosis not present

## 2019-10-22 DIAGNOSIS — Z7982 Long term (current) use of aspirin: Secondary | ICD-10-CM | POA: Diagnosis not present

## 2019-10-22 DIAGNOSIS — Z87891 Personal history of nicotine dependence: Secondary | ICD-10-CM | POA: Diagnosis not present

## 2019-10-22 DIAGNOSIS — R109 Unspecified abdominal pain: Secondary | ICD-10-CM | POA: Diagnosis not present

## 2019-10-22 DIAGNOSIS — W109XXA Fall (on) (from) unspecified stairs and steps, initial encounter: Secondary | ICD-10-CM | POA: Diagnosis not present

## 2019-10-22 NOTE — Telephone Encounter (Signed)
Attempted to call patient about her appointment on 11/19 @ 10:15. No answer left voicemail instructing patient to wear a face mask for the entire appointment and no visitors are allowed during the visit. Patient instructed not to attend the appointment if she was any symptoms. Symptom list and office number left.  °

## 2019-10-23 ENCOUNTER — Ambulatory Visit (INDEPENDENT_AMBULATORY_CARE_PROVIDER_SITE_OTHER): Payer: Medicaid Other | Admitting: *Deleted

## 2019-10-23 ENCOUNTER — Ambulatory Visit (INDEPENDENT_AMBULATORY_CARE_PROVIDER_SITE_OTHER): Payer: Medicaid Other | Admitting: Obstetrics and Gynecology

## 2019-10-23 ENCOUNTER — Ambulatory Visit: Payer: Self-pay

## 2019-10-23 ENCOUNTER — Encounter: Payer: Self-pay | Admitting: Obstetrics and Gynecology

## 2019-10-23 ENCOUNTER — Other Ambulatory Visit: Payer: Self-pay

## 2019-10-23 VITALS — BP 131/82 | HR 82 | Wt 196.4 lb

## 2019-10-23 DIAGNOSIS — O133 Gestational [pregnancy-induced] hypertension without significant proteinuria, third trimester: Secondary | ICD-10-CM

## 2019-10-23 DIAGNOSIS — Z3A32 32 weeks gestation of pregnancy: Secondary | ICD-10-CM

## 2019-10-23 DIAGNOSIS — Z98891 History of uterine scar from previous surgery: Secondary | ICD-10-CM

## 2019-10-23 DIAGNOSIS — Z8759 Personal history of other complications of pregnancy, childbirth and the puerperium: Secondary | ICD-10-CM

## 2019-10-23 DIAGNOSIS — O0993 Supervision of high risk pregnancy, unspecified, third trimester: Secondary | ICD-10-CM

## 2019-10-23 DIAGNOSIS — O0990 Supervision of high risk pregnancy, unspecified, unspecified trimester: Secondary | ICD-10-CM

## 2019-10-23 NOTE — Progress Notes (Signed)
Subjective:  Stacy Christian is a 28 y.o. [phone] at [redacted]w[redacted]d being seen today for ongoing prenatal care.  She is currently monitored for the following issues for this high-risk pregnancy and has History of premature delivery, currently pregnant; History of classical cesarean section; Supervision of high risk pregnancy, antepartum; History of gestational hypertension; Asthma; History of neonatal death; and Gestational hypertension on their problem list.  Patient reports general discomforts of pregnancy.  Contractions: Not present. Vag. Bleeding: None.  Movement: Present. Denies leaking of fluid.   The following portions of the patient's history were reviewed and updated as appropriate: allergies, current medications, past family history, past medical history, past social history, past surgical history and problem list. Problem list updated.  Objective:   Vitals:   10/23/19 1033  BP: 131/82  Pulse: 82  Weight: 196 lb 6.4 oz (89.1 kg)    Fetal Status: Fetal Heart Rate (bpm): NST   Movement: Present     General:  Alert, oriented and cooperative. Patient is in no acute distress.  Skin: Skin is warm and dry. No rash noted.   Cardiovascular: Normal heart rate noted  Respiratory: Normal respiratory effort, no problems with respiration noted  Abdomen: Soft, gravid, appropriate for gestational age. Pain/Pressure: Present     Pelvic:  Cervical exam deferred        Extremities: Normal range of motion.  Edema: Trace  Mental Status: Normal mood and affect. Normal behavior. Normal judgment and thought content.   Urinalysis:      Assessment and Plan:  Pregnancy: [id number] at [redacted]w[redacted]d  1. Supervision of high risk pregnancy, antepartum Stable  2. Gestational hypertension, third trimester BP stable No S/Sx of PEC BP cuff not working. Pt instructed to take BP cuff back to pharmacy  BP monitoring reviewed with pt  3. History of classical cesarean section Repeat c section scheduled  4. History of  neonatal death Continue with antenatal testing   Preterm labor symptoms and general obstetric precautions including but not limited to vaginal bleeding, contractions, leaking of fluid and fetal movement were reviewed in detail with the patient. Please refer to After Visit Summary for other counseling recommendations.  No follow-ups on file.   Chancy Milroy, MD

## 2019-10-23 NOTE — Progress Notes (Signed)

## 2019-10-23 NOTE — Patient Instructions (Signed)
Third Trimester of Pregnancy The third trimester is from week 28 through week 40 (months 7 through 9). The third trimester is a time when the unborn baby (fetus) is growing rapidly. At the end of the ninth month, the fetus is about 20 inches in length and weighs 6-10 pounds. Body changes during your third trimester Your body will continue to go through many changes during pregnancy. The changes vary from woman to woman. During the third trimester:  Your weight will continue to increase. You can expect to gain 25-35 pounds (11-16 kg) by the end of the pregnancy.  You may begin to get stretch marks on your hips, abdomen, and breasts.  You may urinate more often because the fetus is moving lower into your pelvis and pressing on your bladder.  You may develop or continue to have heartburn. This is caused by increased hormones that slow down muscles in the digestive tract.  You may develop or continue to have constipation because increased hormones slow digestion and cause the muscles that push waste through your intestines to relax.  You may develop hemorrhoids. These are swollen veins (varicose veins) in the rectum that can itch or be painful.  You may develop swollen, bulging veins (varicose veins) in your legs.  You may have increased body aches in the pelvis, back, or thighs. This is due to weight gain and increased hormones that are relaxing your joints.  You may have changes in your hair. These can include thickening of your hair, rapid growth, and changes in texture. Some women also have hair loss during or after pregnancy, or hair that feels dry or thin. Your hair will most likely return to normal after your baby is born.  Your breasts will continue to grow and they will continue to become tender. A yellow fluid (colostrum) may leak from your breasts. This is the first milk you are producing for your baby.  Your belly button may stick out.  You may notice more swelling in your hands,  face, or ankles.  You may have increased tingling or numbness in your hands, arms, and legs. The skin on your belly may also feel numb.  You may feel short of breath because of your expanding uterus.  You may have more problems sleeping. This can be caused by the size of your belly, increased need to urinate, and an increase in your body's metabolism.  You may notice the fetus "dropping," or moving lower in your abdomen (lightening).  You may have increased vaginal discharge.  You may notice your joints feel loose and you may have pain around your pelvic bone. What to expect at prenatal visits You will have prenatal exams every 2 weeks until week 36. Then you will have weekly prenatal exams. During a routine prenatal visit:  You will be weighed to make sure you and the baby are growing normally.  Your blood pressure will be taken.  Your abdomen will be measured to track your baby's growth.  The fetal heartbeat will be listened to.  Any test results from the previous visit will be discussed.  You may have a cervical check near your due date to see if your cervix has softened or thinned (effaced).  You will be tested for Group B streptococcus. This happens between 35 and 37 weeks. Your health care provider may ask you:  What your birth plan is.  How you are feeling.  If you are feeling the baby move.  If you have had any abnormal   symptoms, such as leaking fluid, bleeding, severe headaches, or abdominal cramping.  If you are using any tobacco products, including cigarettes, chewing tobacco, and electronic cigarettes.  If you have any questions. Other tests or screenings that may be performed during your third trimester include:  Blood tests that check for low iron levels (anemia).  Fetal testing to check the health, activity level, and growth of the fetus. Testing is done if you have certain medical conditions or if there are problems during the pregnancy.  Nonstress test  (NST). This test checks the health of your baby to make sure there are no signs of problems, such as the baby not getting enough oxygen. During this test, a belt is placed around your belly. The baby is made to move, and its heart rate is monitored during movement. What is false labor? False labor is a condition in which you feel small, irregular tightenings of the muscles in the womb (contractions) that usually go away with rest, changing position, or drinking water. These are called Braxton Hicks contractions. Contractions may last for hours, days, or even weeks before true labor sets in. If contractions come at regular intervals, become more frequent, increase in intensity, or become painful, you should see your health care provider. What are the signs of labor?  Abdominal cramps.  Regular contractions that start at 10 minutes apart and become stronger and more frequent with time.  Contractions that start on the top of the uterus and spread down to the lower abdomen and back.  Increased pelvic pressure and dull back pain.  A watery or bloody mucus discharge that comes from the vagina.  Leaking of amniotic fluid. This is also known as your "water breaking." It could be a slow trickle or a gush. Let your health care provider know if it has a color or strange odor. If you have any of these signs, call your health care provider right away, even if it is before your due date. Follow these instructions at home: Medicines  Follow your health care provider's instructions regarding medicine use. Specific medicines may be either safe or unsafe to take during pregnancy.  Take a prenatal vitamin that contains at least 600 micrograms (mcg) of folic acid.  If you develop constipation, try taking a stool softener if your health care provider approves. Eating and drinking   Eat a balanced diet that includes fresh fruits and vegetables, whole grains, good sources of protein such as meat, eggs, or tofu,  and low-fat dairy. Your health care provider will help you determine the amount of weight gain that is right for you.  Avoid raw meat and uncooked cheese. These carry germs that can cause birth defects in the baby.  If you have low calcium intake from food, talk to your health care provider about whether you should take a daily calcium supplement.  Eat four or five small meals rather than three large meals a day.  Limit foods that are high in fat and processed sugars, such as fried and sweet foods.  To prevent constipation: ? Drink enough fluid to keep your urine clear or pale yellow. ? Eat foods that are high in fiber, such as fresh fruits and vegetables, whole grains, and beans. Activity  Exercise only as directed by your health care provider. Most women can continue their usual exercise routine during pregnancy. Try to exercise for 30 minutes at least 5 days a week. Stop exercising if you experience uterine contractions.  Avoid heavy lifting.  Do   not exercise in extreme heat or humidity, or at high altitudes.  Wear low-heel, comfortable shoes.  Practice good posture.  You may continue to have sex unless your health care provider tells you otherwise. Relieving pain and discomfort  Take frequent breaks and rest with your legs elevated if you have leg cramps or low back pain.  Take warm sitz baths to soothe any pain or discomfort caused by hemorrhoids. Use hemorrhoid cream if your health care provider approves.  Wear a good support bra to prevent discomfort from breast tenderness.  If you develop varicose veins: ? Wear support pantyhose or compression stockings as told by your healthcare provider. ? Elevate your feet for 15 minutes, 3-4 times a day. Prenatal care  Write down your questions. Take them to your prenatal visits.  Keep all your prenatal visits as told by your health care provider. This is important. Safety  Wear your seat belt at all times when driving.  Make  a list of emergency phone numbers, including numbers for family, friends, the hospital, and police and fire departments. General instructions  Avoid cat litter boxes and soil used by cats. These carry germs that can cause birth defects in the baby. If you have a cat, ask someone to clean the litter box for you.  Do not travel far distances unless it is absolutely necessary and only with the approval of your health care provider.  Do not use hot tubs, steam rooms, or saunas.  Do not drink alcohol.  Do not use any products that contain nicotine or tobacco, such as cigarettes and e-cigarettes. If you need help quitting, ask your health care provider.  Do not use any medicinal herbs or unprescribed drugs. These chemicals affect the formation and growth of the baby.  Do not douche or use tampons or scented sanitary pads.  Do not cross your legs for long periods of time.  To prepare for the arrival of your baby: ? Take prenatal classes to understand, practice, and ask questions about labor and delivery. ? Make a trial run to the hospital. ? Visit the hospital and tour the maternity area. ? Arrange for maternity or paternity leave through employers. ? Arrange for family and friends to take care of pets while you are in the hospital. ? Purchase a rear-facing car seat and make sure you know how to install it in your car. ? Pack your hospital bag. ? Prepare the baby's nursery. Make sure to remove all pillows and stuffed animals from the baby's crib to prevent suffocation.  Visit your dentist if you have not gone during your pregnancy. Use a soft toothbrush to brush your teeth and be gentle when you floss. Contact a health care provider if:  You are unsure if you are in labor or if your water has broken.  You become dizzy.  You have mild pelvic cramps, pelvic pressure, or nagging pain in your abdominal area.  You have lower back pain.  You have persistent nausea, vomiting, or diarrhea.   You have an unusual or bad smelling vaginal discharge.  You have pain when you urinate. Get help right away if:  Your water breaks before 37 weeks.  You have regular contractions less than 5 minutes apart before 37 weeks.  You have a fever.  You are leaking fluid from your vagina.  You have spotting or bleeding from your vagina.  You have severe abdominal pain or cramping.  You have rapid weight loss or weight gain.  You have   shortness of breath with chest pain.  You notice sudden or extreme swelling of your face, hands, ankles, feet, or legs.  Your baby makes fewer than 10 movements in 2 hours.  You have severe headaches that do not go away when you take medicine.  You have vision changes. Summary  The third trimester is from week 28 through week 40, months 7 through 9. The third trimester is a time when the unborn baby (fetus) is growing rapidly.  During the third trimester, your discomfort may increase as you and your baby continue to gain weight. You may have abdominal, leg, and back pain, sleeping problems, and an increased need to urinate.  During the third trimester your breasts will keep growing and they will continue to become tender. A yellow fluid (colostrum) may leak from your breasts. This is the first milk you are producing for your baby.  False labor is a condition in which you feel small, irregular tightenings of the muscles in the womb (contractions) that eventually go away. These are called Braxton Hicks contractions. Contractions may last for hours, days, or even weeks before true labor sets in.  Signs of labor can include: abdominal cramps; regular contractions that start at 10 minutes apart and become stronger and more frequent with time; watery or bloody mucus discharge that comes from the vagina; increased pelvic pressure and dull back pain; and leaking of amniotic fluid. This information is not intended to replace advice given to you by your health  care provider. Make sure you discuss any questions you have with your health care provider. Document Released: 11/14/2001 Document Revised: 03/13/2019 Document Reviewed: 12/26/2016 [employer] Patient Education  2020 [employer]  

## 2019-10-23 NOTE — Progress Notes (Signed)
Pt states she fell several days ago and lightly bumped her stomach. She went to [facility] for evaluation and was told everything was fine. Since then she has been feeling some light abdominal cramping. She reports good FM and  denies having any vaginal bleeding. Pt states her BP cuff is not working and she has been able to check her BP since 10/1.

## 2019-10-28 ENCOUNTER — Encounter: Payer: Medicaid Other | Admitting: Family Medicine

## 2019-10-29 ENCOUNTER — Encounter (HOSPITAL_COMMUNITY): Payer: Self-pay

## 2019-10-29 ENCOUNTER — Encounter (HOSPITAL_COMMUNITY): Payer: Self-pay | Admitting: Student

## 2019-10-29 ENCOUNTER — Ambulatory Visit (HOSPITAL_COMMUNITY): Payer: Medicaid Other | Admitting: *Deleted

## 2019-10-29 ENCOUNTER — Other Ambulatory Visit: Payer: Self-pay | Admitting: Obstetrics & Gynecology

## 2019-10-29 ENCOUNTER — Inpatient Hospital Stay (HOSPITAL_COMMUNITY)
Admission: AD | Admit: 2019-10-29 | Discharge: 2019-10-29 | Disposition: A | Discharge status: Home / Self Care | Payer: Medicaid Other | Source: Intra-hospital | Attending: Obstetrics and Gynecology | Admitting: Obstetrics and Gynecology

## 2019-10-29 ENCOUNTER — Other Ambulatory Visit: Payer: Self-pay

## 2019-10-29 ENCOUNTER — Ambulatory Visit (HOSPITAL_COMMUNITY)
Admission: RE | Admit: 2019-10-29 | Discharge: 2019-10-29 | Disposition: A | Discharge status: Home / Self Care | Payer: Medicaid Other | Source: Ambulatory Visit | Attending: Maternal & Fetal Medicine | Admitting: Maternal & Fetal Medicine

## 2019-10-29 DIAGNOSIS — Z362 Encounter for other antenatal screening follow-up: Secondary | ICD-10-CM | POA: Diagnosis not present

## 2019-10-29 DIAGNOSIS — O0990 Supervision of high risk pregnancy, unspecified, unspecified trimester: Secondary | ICD-10-CM

## 2019-10-29 DIAGNOSIS — O09213 Supervision of pregnancy with history of pre-term labor, third trimester: Secondary | ICD-10-CM

## 2019-10-29 DIAGNOSIS — O09293 Supervision of pregnancy with other poor reproductive or obstetric history, third trimester: Secondary | ICD-10-CM | POA: Diagnosis not present

## 2019-10-29 DIAGNOSIS — Z3A33 33 weeks gestation of pregnancy: Secondary | ICD-10-CM

## 2019-10-29 DIAGNOSIS — O133 Gestational [pregnancy-induced] hypertension without significant proteinuria, third trimester: Secondary | ICD-10-CM

## 2019-10-29 DIAGNOSIS — R519 Headache, unspecified: Secondary | ICD-10-CM | POA: Diagnosis not present

## 2019-10-29 DIAGNOSIS — Z8759 Personal history of other complications of pregnancy, childbirth and the puerperium: Secondary | ICD-10-CM

## 2019-10-29 DIAGNOSIS — O34219 Maternal care for unspecified type scar from previous cesarean delivery: Secondary | ICD-10-CM

## 2019-10-29 DIAGNOSIS — O26893 Other specified pregnancy related conditions, third trimester: Secondary | ICD-10-CM | POA: Diagnosis not present

## 2019-10-29 DIAGNOSIS — R600 Localized edema: Secondary | ICD-10-CM | POA: Diagnosis not present

## 2019-10-29 HISTORY — DX: Unspecified pre-eclampsia, unspecified trimester: O14.90

## 2019-10-29 LAB — COMPREHENSIVE METABOLIC PANEL
ALT: 11 U/L (ref 0–44)
AST: 15 U/L (ref 15–41)
Albumin: 2.9 g/dL — ABNORMAL LOW (ref 3.5–5.0)
Alkaline Phosphatase: 96 U/L (ref 38–126)
Anion gap: 6 (ref 5–15)
BUN: <5 mg/dL — ABNORMAL LOW (ref 6–20)
CO2: 22 mmol/L (ref 22–32)
Calcium: 8.1 mg/dL — ABNORMAL LOW (ref 8.9–10.3)
Chloride: 107 mmol/L (ref 98–111)
Creatinine, Ser: 0.56 mg/dL (ref 0.44–1.00)
GFR calc Af Amer: >60 mL/min (ref >60)
GFR calc non Af Amer: >60 mL/min (ref >60)
Glucose, Bld: 71 mg/dL (ref 70–99)
Potassium: 3.2 mmol/L — ABNORMAL LOW (ref 3.5–5.1)
Sodium: 135 mmol/L (ref 135–145)
Total Bilirubin: 0.6 mg/dL (ref 0.3–1.2)
Total Protein: 6.6 g/dL (ref 6.5–8.1)

## 2019-10-29 LAB — CBC
HCT: 30.4 % — ABNORMAL LOW (ref 36.0–46.0)
Hemoglobin: 10.6 g/dL — ABNORMAL LOW (ref 12.0–15.0)
MCH: 29.7 pg (ref 26.0–34.0)
MCHC: 34.9 g/dL (ref 30.0–36.0)
MCV: 85.2 fL (ref 80.0–100.0)
Platelets: 166 10*3/uL (ref 150–400)
RBC: 3.57 MIL/uL — ABNORMAL LOW (ref 3.87–5.11)
RDW: 11.9 % (ref 11.5–15.5)
WBC: 11.1 10*3/uL — ABNORMAL HIGH (ref 4.0–10.5)
nRBC: 0 % (ref 0.0–0.2)

## 2019-10-29 LAB — PROTEIN / CREATININE RATIO, URINE
Creatinine, Urine: 82.11 mg/dL
Protein Creatinine Ratio: 0.09 mg/mg{Cre} (ref 0.00–0.15)
Total Protein, Urine: 7 mg/dL

## 2019-10-29 MED ORDER — BETAMETHASONE SOD PHOS & ACET 6 (3-3) MG/ML IJ SUSP
12.0000 mg | Freq: Once | INTRAMUSCULAR | Status: AC
  Administered 2019-10-29: 12 mg via INTRAMUSCULAR
  Filled 2019-10-29: qty 5

## 2019-10-29 MED ORDER — ACETAMINOPHEN 500 MG PO TABS
1000.0000 mg | ORAL_TABLET | Freq: Once | ORAL | Status: AC
  Administered 2019-10-29: 16:00:00 1000 mg via ORAL
  Filled 2019-10-29: qty 2

## 2019-10-29 NOTE — [facility] Note (Signed)
Pt states that she went for a routine u/s today. Pt states that during the u/s she got a headache. Pt states that she was sent from the office to be further evaluated.   Denies vaginal bleeding or Denies LOF.   Reports +FM

## 2019-10-29 NOTE — [facility] Provider Note (Signed)
Chief Complaint  Patient presents with  . Headache  . Hypertension     First Provider Initiated Contact with Patient 10/29/19 1550      S: Stacy Christian  is a 28 y.o. y.o. year old [id number] female at [redacted]w[redacted]d weeks gestation who presents to [facility] from MFM with elevated blood pressures. Hx of gestational hypertension & preeclampsia in previous pregnancies. Diagnosed with GHTN during this pregnancy at 29 wks. PEC labs last month were normal.  Was in MFM today & had elevated BPs (140s/90s) & reports a headache. Sent here for labs & BMZ.   Hx of 23.4 wk classical c/section for [facility] & abruption followed by a 27 wk delivery due to PEC.   Associated symptoms: endorses Headache that she rates 5/10; hasn't treated symptoms, denies vision changes, denies epigastric pain Contractions: denies Vaginal bleeding: denies Fetal movement: good  O:  Patient Vitals for the past 24 hrs:  BP Temp Pulse Resp SpO2 Weight  10/29/19 1716 (!) 143/91 - 70 - - -  10/29/19 1701 135/85 - 77 - - -  10/29/19 1646 (!) 144/90 - 75 - - -  10/29/19 1631 (!) 143/85 - 77 - - -  10/29/19 1616 140/88 - 70 - - -  10/29/19 1601 (!) 144/91 - 77 - - -  10/29/19 1546 135/86 - 75 - - -  10/29/19 1531 (!) 140/93 - 82 - - -  10/29/19 1521 (!) 141/88 - 82 - - -  10/29/19 1512 (!) 147/94 98.7 F (37.1 C) 82 20 98 % -  10/29/19 1504 - - - - - 90.4 kg   General: NAD Heart: Regular rate Lungs: Normal rate and effort Abd: Soft, NT, Gravid, S=D Extremities: non pitting Pedal edema Neuro: 2+ deep tendon reflexes, No clonus  NST:  Baseline: 125 bpm, Variability: Good {> 6 bpm), Accelerations: Reactive and Decelerations: Absent  Results for orders placed or performed during the hospital encounter of 10/29/19 (from the past 24 hour(s))  Protein / creatinine ratio, urine     Status: None   Collection Time: 10/29/19  3:38 PM  Result Value Ref Range   Creatinine, Urine 82.11 mg/dL   Total Protein, Urine 7 mg/dL   Protein  Creatinine Ratio 0.09 0.00 - 0.15 mg/mg[Cre]  CBC     Status: Abnormal   Collection Time: 10/29/19  3:44 PM  Result Value Ref Range   WBC 11.1 (H) 4.0 - 10.5 K/uL   RBC 3.57 (L) 3.87 - 5.11 MIL/uL   Hemoglobin 10.6 (L) 12.0 - 15.0 g/dL   HCT 62.7 (L) 03.5 - 00.9 %   MCV 85.2 80.0 - 100.0 fL   MCH 29.7 26.0 - 34.0 pg   MCHC 34.9 30.0 - 36.0 g/dL   RDW 38.1 82.9 - 93.7 %   Platelets 166 150 - 400 K/uL   nRBC 0.0 0.0 - 0.2 %  Comprehensive metabolic panel     Status: Abnormal   Collection Time: 10/29/19  3:44 PM  Result Value Ref Range   Sodium 135 135 - 145 mmol/L   Potassium 3.2 (L) 3.5 - 5.1 mmol/L   Chloride 107 98 - 111 mmol/L   CO2 22 22 - 32 mmol/L   Glucose, Bld 71 70 - 99 mg/dL   BUN <5 (L) 6 - 20 mg/dL   Creatinine, Ser 1.69 0.44 - 1.00 mg/dL   Calcium 8.1 (L) 8.9 - 10.3 mg/dL   Total Protein 6.6 6.5 - 8.1 g/dL   Albumin 2.9 (L)  3.5 - 5.0 g/dL   AST 15 15 - 41 U/L   ALT 11 0 - 44 U/L   Alkaline Phosphatase 96 38 - 126 U/L   Total Bilirubin 0.6 0.3 - 1.2 mg/dL   GFR calc non Af Amer >60 >60 mL/min   GFR calc Af Amer >60 >60 mL/min   Anion gap 6 5 - 15   MDM Reactive NST BPs elevated but not severe Tylenol 1 gm given for headache. Rates pain 3/10.  PEC labs normal BMZ given in MAU per MFM recommendation   A:  1. Gestational hypertension, third trimester   2. [redacted] weeks gestation of pregnancy      P:  Discharge home in stable condition per consult with Sloan Leiter, MD  Patient to return to [facility]U tomorrow to complete BMZ series & for BP check Preeclampsia precautions. Return to maternity admissions as needed in emergencies  Jorje Guild, NP 10/29/2019 5:52 PM

## 2019-10-29 NOTE — Discharge Instructions (Signed)
Hypertension During Pregnancy °High blood pressure (hypertension) is when the force of blood pumping through the arteries is too strong. Arteries are blood vessels that carry blood from the heart throughout the body. Hypertension during pregnancy can be mild or severe. Severe hypertension during pregnancy (preeclampsia) is a medical emergency that requires prompt evaluation and treatment. °Different types of hypertension can happen during pregnancy. These include: °· Chronic hypertension. This happens when you had high blood pressure before you became pregnant, and it continues during the pregnancy. Hypertension that develops before you are [redacted] weeks pregnant and continues during the pregnancy is also called chronic hypertension. If you have chronic hypertension, it will not go away after you have your baby. You will need follow-up visits with your health care provider after you have your baby. Your doctor may want you to keep taking medicine for your blood pressure. °· Gestational hypertension. This is hypertension that develops after the 20th week of pregnancy. Gestational hypertension usually goes away after you have your baby, but your health care provider will need to monitor your blood pressure to make sure that it is getting better. °· Preeclampsia. This is severe hypertension during pregnancy. This can cause serious complications for you and your baby and can also cause complications for you after the delivery of your baby. °· Postpartum preeclampsia. You may develop severe hypertension after giving birth. This usually occurs within 48 hours after childbirth but may occur up to 6 weeks after giving birth. This is rare. °How does this affect me? °Women who have hypertension during pregnancy have a greater chance of developing hypertension later in life or during future pregnancies. In some cases, hypertension during pregnancy can cause serious complications, such as: °· Stroke. °· Heart attack. °· Injury to  other organs, such as kidneys, lungs, or liver. °· Preeclampsia. °· Convulsions or seizures. °· Placental abruption. °How does this affect my baby? °Hypertension during pregnancy can affect your baby. Your baby may: °· Be born early (prematurely). °· Not weigh as much as he or she should at birth (low birth weight). °· Not tolerate labor well, leading to an unplanned cesarean delivery. °What are the risks? °There are certain factors that make it more likely for you to develop hypertension during pregnancy. These include: °· Having hypertension during a previous pregnancy. °· Being overweight. °· Being age 35 or older. °· Being pregnant for the first time. °· Being pregnant with more than one baby. °· Becoming pregnant using fertilization methods, such as IVF (in vitro fertilization). °· Having other medical problems, such as diabetes, kidney disease, or lupus. °· Having a family history of hypertension. °What can I do to lower my risk? °The exact cause of hypertension during pregnancy is not known. You may be able to lower your risk by: °· Maintaining a healthy weight. °· Eating a healthy and balanced diet. °· Following your health care provider's instructions about treating any long-term conditions that you had before becoming pregnant. °It is very important to keep all of your prenatal care appointments. Your health care provider will check your blood pressure and make sure that your pregnancy is progressing as expected. If a problem is found, early treatment can prevent complications. °How is this treated? °Treatment for hypertension during pregnancy varies depending on the type of hypertension you have and how serious it is. °· If you were taking medicine for high blood pressure before you became pregnant, talk with your health care provider. You may need to change medicine during pregnancy because   some medicines, like ACE inhibitors, may not be considered safe for your baby. °· If you have gestational  hypertension, your health care provider may order medicine to treat this during pregnancy. °· If you are at risk for preeclampsia, your health care provider may recommend that you take a low-dose aspirin during your pregnancy. °· If you have severe hypertension, you may need to be hospitalized so you and your baby can be monitored closely. You may also need to be given medicine to lower your blood pressure. This medicine may be given by mouth or through an IV. °· In some cases, if your condition gets worse, you may need to deliver your baby early. °Follow these instructions at home: °Eating and drinking ° °· Drink enough fluid to keep your urine pale yellow. °· Avoid caffeine. °Lifestyle °· Do not use any products that contain nicotine or tobacco, such as cigarettes, e-cigarettes, and chewing tobacco. If you need help quitting, ask your health care provider. °· Do not use alcohol or drugs. °· Avoid stress as much as possible. °· Rest and get plenty of sleep. °· Regular exercise can help to reduce your blood pressure. Ask your health care provider what kinds of exercise are best for you. °General instructions °· Take over-the-counter and prescription medicines only as told by your health care provider. °· Keep all prenatal and follow-up visits as told by your health care provider. This is important. °Contact a health care provider if: °· You have symptoms that your health care provider told you may require more treatment or monitoring, such as: °? Headaches. °? Nausea or vomiting. °? Abdominal pain. °? Dizziness. °? Light-headedness. °Get help right away if: °· You have: °? Severe abdominal pain that does not get better with treatment. °? A severe headache that does not get better. °? Vomiting that does not get better. °? Sudden, rapid weight gain. °? Sudden swelling in your hands, ankles, or face. °? Vaginal bleeding. °? Blood in your urine. °? Blurred or double vision. °? Shortness of breath or chest  pain. °? Weakness on one side of your body. °? Difficulty speaking. °· Your baby is not moving as much as usual. °Summary °· High blood pressure (hypertension) is when the force of blood pumping through the arteries is too strong. °· Hypertension during pregnancy can cause problems for you and your baby. °· Treatment for hypertension during pregnancy varies depending on the type of hypertension you have and how serious it is. °· Keep all prenatal and follow-up visits as told by your health care provider. This is important. °This information is not intended to replace advice given to you by your health care provider. Make sure you discuss any questions you have with your health care provider. °Document Released: 08/08/2011 Document Revised: 03/13/2019 Document Reviewed: 12/17/2018 °Elsevier Patient Education © 2020 [employer] ° ° ° °Fetal Movement Counts °Patient Name: ________________________________________________ Patient Due Date: ____________________ °What is a fetal movement count? ° °A fetal movement count is the number of times that you feel your baby move during a certain amount of time. This may also be called a fetal kick count. A fetal movement count is recommended for every pregnant woman. You may be asked to start counting fetal movements as early as week 28 of your pregnancy. °Pay attention to when your baby is most active. You may notice your baby's sleep and wake cycles. You may also notice things that make your baby move more. You should do a fetal movement count: °·   When your baby is normally most active. °· At the same time each day. °A good time to count movements is while you are resting, after having something to eat and drink. °How do I count fetal movements? °1. Find a quiet, comfortable area. Sit, or lie down on your side. °2. Write down the date, the start time and stop time, and the number of movements that you felt between those two times. Take this information with you to your health  care visits. °3. For 2 hours, count kicks, flutters, swishes, rolls, and jabs. You should feel at least 10 movements during 2 hours. °4. You may stop counting after you have felt 10 movements. °5. If you do not feel 10 movements in 2 hours, have something to eat and drink. Then, keep resting and counting for 1 hour. If you feel at least 4 movements during that hour, you may stop counting. °Contact a health care provider if: °· You feel fewer than 4 movements in 2 hours. °· Your baby is not moving like he or she usually does. °Date: ____________ Start time: ____________ Stop time: ____________ Movements: ____________ °Date: ____________ Start time: ____________ Stop time: ____________ Movements: ____________ °Date: ____________ Start time: ____________ Stop time: ____________ Movements: ____________ °Date: ____________ Start time: ____________ Stop time: ____________ Movements: ____________ °Date: ____________ Start time: ____________ Stop time: ____________ Movements: ____________ °Date: ____________ Start time: ____________ Stop time: ____________ Movements: ____________ °Date: ____________ Start time: ____________ Stop time: ____________ Movements: ____________ °Date: ____________ Start time: ____________ Stop time: ____________ Movements: ____________ °Date: ____________ Start time: ____________ Stop time: ____________ Movements: ____________ °This information is not intended to replace advice given to you by your health care provider. Make sure you discuss any questions you have with your health care provider. °Document Released: 12/20/2006 Document Revised: 12/10/2018 Document Reviewed: 12/30/2015 °Elsevier Patient Education © 2020 [employer] Inc[employer] ° °

## 2019-10-30 ENCOUNTER — Other Ambulatory Visit: Payer: Self-pay

## 2019-10-30 ENCOUNTER — Inpatient Hospital Stay (HOSPITAL_COMMUNITY)
Admission: AD | Admit: 2019-10-30 | Discharge: 2019-10-30 | Disposition: A | Discharge status: Home / Self Care | Payer: Medicaid Other | Attending: Obstetrics and Gynecology | Admitting: Obstetrics and Gynecology

## 2019-10-30 DIAGNOSIS — Z3689 Encounter for other specified antenatal screening: Secondary | ICD-10-CM | POA: Diagnosis not present

## 2019-10-30 DIAGNOSIS — O34219 Maternal care for unspecified type scar from previous cesarean delivery: Secondary | ICD-10-CM | POA: Diagnosis not present

## 2019-10-30 DIAGNOSIS — O133 Gestational [pregnancy-induced] hypertension without significant proteinuria, third trimester: Secondary | ICD-10-CM | POA: Diagnosis present

## 2019-10-30 DIAGNOSIS — Z833 Family history of diabetes mellitus: Secondary | ICD-10-CM | POA: Diagnosis not present

## 2019-10-30 DIAGNOSIS — Z8249 Family history of ischemic heart disease and other diseases of the circulatory system: Secondary | ICD-10-CM | POA: Diagnosis not present

## 2019-10-30 DIAGNOSIS — O99513 Diseases of the respiratory system complicating pregnancy, third trimester: Secondary | ICD-10-CM | POA: Insufficient documentation

## 2019-10-30 DIAGNOSIS — Z809 Family history of malignant neoplasm, unspecified: Secondary | ICD-10-CM | POA: Diagnosis not present

## 2019-10-30 DIAGNOSIS — O0990 Supervision of high risk pregnancy, unspecified, unspecified trimester: Secondary | ICD-10-CM

## 2019-10-30 DIAGNOSIS — J45909 Unspecified asthma, uncomplicated: Secondary | ICD-10-CM | POA: Insufficient documentation

## 2019-10-30 DIAGNOSIS — Z7982 Long term (current) use of aspirin: Secondary | ICD-10-CM | POA: Diagnosis not present

## 2019-10-30 DIAGNOSIS — O368130 Decreased fetal movements, third trimester, not applicable or unspecified: Secondary | ICD-10-CM | POA: Diagnosis not present

## 2019-10-30 DIAGNOSIS — Z3A33 33 weeks gestation of pregnancy: Secondary | ICD-10-CM | POA: Diagnosis not present

## 2019-10-30 DIAGNOSIS — F17210 Nicotine dependence, cigarettes, uncomplicated: Secondary | ICD-10-CM | POA: Diagnosis not present

## 2019-10-30 DIAGNOSIS — O09213 Supervision of pregnancy with history of pre-term labor, third trimester: Secondary | ICD-10-CM | POA: Diagnosis not present

## 2019-10-30 DIAGNOSIS — O99333 Smoking (tobacco) complicating pregnancy, third trimester: Secondary | ICD-10-CM | POA: Insufficient documentation

## 2019-10-30 DIAGNOSIS — Z8759 Personal history of other complications of pregnancy, childbirth and the puerperium: Secondary | ICD-10-CM

## 2019-10-30 MED ORDER — BETAMETHASONE SOD PHOS & ACET 6 (3-3) MG/ML IJ SUSP
12.0000 mg | Freq: Once | INTRAMUSCULAR | Status: AC
  Administered 2019-10-30: 12 mg via INTRAMUSCULAR

## 2019-10-30 NOTE — MAU Note (Signed)
While talking in triage, pt mentioned that the baby has not been moving as much today, 'should she be concerned?"

## 2019-10-30 NOTE — MAU Note (Signed)
Pt here for 2nd dose of BMZ and BP check.  No complaints.  Denies HA, visual changes, epigastric pain or increase in swelling.  Denies bleeding or leaking. States has had a good day.  No problems with first injection.

## 2019-10-30 NOTE — MAU Provider Note (Signed)
History     CSN: [id number]  Arrival date and time: 10/30/19 1753   First Provider Initiated Contact with Patient 10/30/19 1830      Chief Complaint  Patient presents with  . Follow-up   HPI Stacy Christian 28 y.o. 110w3dComes in for betamethasone injection - second dose and for BP check with gestational hypertension.  Client reports she is not feeling the baby move as much today so the monitor was applied.  Denies any headache today or blurred vision.  Denies RUQ pain.  Has not eaten much today but cereal and crackers as she was anticipating a large Thanksgiving meal this evening.  History of one neonatal death.  Planned C/S on 112-Jan-2021  Seen by MFM yesterday and had elevated BPs but BPP was 8/8 and dopplers done.  Was observed in MAU and BPs were not severe.  Labs done - no evidence of preeclampsia.  OB History    Gravida  4   Para  2   Term      Preterm  2   AB  1   Living  1     SAB  1   TAB      Ectopic      Multiple      Live Births  2           Past Medical History:  Diagnosis Date  . Asthma   . Preeclampsia   . Preterm labor     Past Surgical History:  Procedure Laterality Date  . CESAREAN SECTION    . TONSILLECTOMY      Family History  Problem Relation Age of Onset  . Diabetes Mother   . Hypertension Mother   . Heart disease Mother   . Diabetes Father   . Hypertension Father   . Heart disease Father   . Diabetes Maternal Grandmother   . Cancer Maternal Grandfather   . Heart disease Paternal Grandmother   . Heart disease Paternal Grandfather     Social History   Tobacco Use  . Smoking status: Light Tobacco Smoker    Packs/day: 0.15    Types: Cigarettes    Last attempt to quit: 05/23/2019    Years since quitting: 0.4  . Smokeless tobacco: Never Used  Substance Use Topics  . Alcohol use: No  . Drug use: Not Currently    Frequency: 3.0 times per week    Types: Marijuana    Comment: 10/21/19 last use     Allergies: No  Known Allergies  Medications Prior to Admission  Medication Sig Dispense Refill Last Dose  . aspirin EC 81 MG tablet Take 1 tablet (81 mg total) by mouth daily. 100 tablet 3   . Blood Pressure Monitoring (BLOOD PRESSURE KIT) DEVI 1 Device by Does not apply route as needed. ICD 10:  O09.90 (Patient not taking: Reported on 10/23/2019) 1 Device 0   . Prenatal Vit-Fe Fumarate-FA (PRENATAL VITAMINS) 28-0.8 MG TABS Take 1 tablet by mouth daily. 30 tablet 6     Review of Systems  Constitutional: Negative for fever.  Respiratory: Negative for cough and shortness of breath.   Gastrointestinal: Negative for abdominal pain, nausea and vomiting.  Genitourinary: Negative for dysuria, vaginal bleeding and vaginal discharge.       Decreased fetal movement   Physical Exam   Blood pressure 136/87, pulse 81, temperature 98.1 F (36.7 C), temperature source Oral, resp. rate 16, last menstrual period 03/10/2019, SpO2 100 %, unknown if currently breastfeeding.  Physical Exam  Nursing note and vitals reviewed. Constitutional: She is oriented to person, place, and time. She appears well-developed and well-nourished.  HENT:  Head: Normocephalic.  Eyes: EOM are normal.  Neck: Neck supple.  Cardiovascular: Regular rhythm.  Respiratory: Effort normal. No respiratory distress.  GI: Soft. There is no abdominal tenderness. There is no rebound and no guarding.  FHT baseline 120 with moderate variability.  Accels of 15x15 noted.  No contractions.  No decelerations.  Musculoskeletal: Normal range of motion.  Neurological: She is alert and oriented to person, place, and time.  Skin: Skin is warm and dry.  Psychiatric: She has a normal mood and affect.    MAU Course  Procedures  MDM Client has not eaten today and while one good accel seen at the beginning of the strip, the baby seemed to be in a sleep cycle.  Client given Sprite and apple pie.  Accels of 15x15 seen and client discharged.  Given betamethasone  and BP was stable tonight.  No headache or other  signs of preeclampsia.  Assessment and Plan  Gestational hypertension - normal BP Decreased fetal movement Reactive NST  Plan Send home and keep appointments as scheduled in the office. See AVS for additional instructions given to client. Encouraged to eat protein at every meal.  Advised to eat small amounts every 3-4 hours.   L  10/30/2019, 7:09 PM

## 2019-10-30 NOTE — Discharge Instructions (Signed)
Keep your appointments next week. Return if your baby is not moving well knowing that the baby will have sleep cycles and will not move continuously. Get your BP cuff repaired. Return if you have headaches, blurred vision or right upper quadrant pain.

## 2019-11-05 ENCOUNTER — Ambulatory Visit (INDEPENDENT_AMBULATORY_CARE_PROVIDER_SITE_OTHER): Payer: Medicaid Other | Admitting: Obstetrics and Gynecology

## 2019-11-05 ENCOUNTER — Ambulatory Visit: Payer: Self-pay

## 2019-11-05 ENCOUNTER — Ambulatory Visit (HOSPITAL_COMMUNITY): Payer: Medicaid Other | Admitting: *Deleted

## 2019-11-05 ENCOUNTER — Encounter (HOSPITAL_COMMUNITY): Payer: Self-pay

## 2019-11-05 ENCOUNTER — Ambulatory Visit (HOSPITAL_COMMUNITY)
Admission: RE | Admit: 2019-11-05 | Discharge: 2019-11-05 | Disposition: A | Discharge status: Home / Self Care | Payer: Medicaid Other | Source: Ambulatory Visit | Attending: Obstetrics and Gynecology | Admitting: Obstetrics and Gynecology

## 2019-11-05 ENCOUNTER — Other Ambulatory Visit: Payer: Self-pay

## 2019-11-05 ENCOUNTER — Ambulatory Visit (INDEPENDENT_AMBULATORY_CARE_PROVIDER_SITE_OTHER): Payer: Medicaid Other | Admitting: *Deleted

## 2019-11-05 ENCOUNTER — Other Ambulatory Visit (HOSPITAL_COMMUNITY): Payer: Self-pay | Admitting: *Deleted

## 2019-11-05 VITALS — BP 138/94 | HR 76 | Wt 201.7 lb

## 2019-11-05 DIAGNOSIS — O133 Gestational [pregnancy-induced] hypertension without significant proteinuria, third trimester: Secondary | ICD-10-CM

## 2019-11-05 DIAGNOSIS — Z98891 History of uterine scar from previous surgery: Secondary | ICD-10-CM

## 2019-11-05 DIAGNOSIS — O09893 Supervision of other high risk pregnancies, third trimester: Secondary | ICD-10-CM

## 2019-11-05 DIAGNOSIS — O34219 Maternal care for unspecified type scar from previous cesarean delivery: Secondary | ICD-10-CM | POA: Diagnosis not present

## 2019-11-05 DIAGNOSIS — Z8759 Personal history of other complications of pregnancy, childbirth and the puerperium: Secondary | ICD-10-CM | POA: Insufficient documentation

## 2019-11-05 DIAGNOSIS — O0933 Supervision of pregnancy with insufficient antenatal care, third trimester: Secondary | ICD-10-CM

## 2019-11-05 DIAGNOSIS — O365990 Maternal care for other known or suspected poor fetal growth, unspecified trimester, not applicable or unspecified: Secondary | ICD-10-CM

## 2019-11-05 DIAGNOSIS — Z3A34 34 weeks gestation of pregnancy: Secondary | ICD-10-CM | POA: Diagnosis not present

## 2019-11-05 DIAGNOSIS — O09899 Supervision of other high risk pregnancies, unspecified trimester: Secondary | ICD-10-CM

## 2019-11-05 DIAGNOSIS — O0990 Supervision of high risk pregnancy, unspecified, unspecified trimester: Secondary | ICD-10-CM | POA: Insufficient documentation

## 2019-11-05 DIAGNOSIS — O09293 Supervision of pregnancy with other poor reproductive or obstetric history, third trimester: Secondary | ICD-10-CM

## 2019-11-05 DIAGNOSIS — O09213 Supervision of pregnancy with history of pre-term labor, third trimester: Secondary | ICD-10-CM | POA: Diagnosis not present

## 2019-11-05 DIAGNOSIS — O365930 Maternal care for other known or suspected poor fetal growth, third trimester, not applicable or unspecified: Secondary | ICD-10-CM | POA: Diagnosis not present

## 2019-11-05 DIAGNOSIS — O0930 Supervision of pregnancy with insufficient antenatal care, unspecified trimester: Secondary | ICD-10-CM | POA: Insufficient documentation

## 2019-11-05 DIAGNOSIS — O0993 Supervision of high risk pregnancy, unspecified, third trimester: Secondary | ICD-10-CM

## 2019-11-05 LAB — POCT URINALYSIS DIP (DEVICE)
Bilirubin Urine: NEGATIVE
Glucose, UA: NEGATIVE mg/dL
Ketones, ur: NEGATIVE mg/dL
Nitrite: NEGATIVE
Protein, ur: NEGATIVE mg/dL
Specific Gravity, Urine: 1.025 (ref 1.005–1.030)
Urobilinogen, UA: 0.2 mg/dL (ref 0.0–1.0)
pH: 7 (ref 5.0–8.0)

## 2019-11-05 NOTE — Progress Notes (Signed)
Pt had MAU visit on 11/25 due to elevated BP @ [place] visit - received Betamethasone with 2nd dose on 11/26.  Pt denies H/A or visual disturbances today and none for the past several days.

## 2019-11-05 NOTE — Progress Notes (Signed)

## 2019-11-05 NOTE — Progress Notes (Signed)
Prenatal Visit Note Date: 11/05/2019 Clinic: [facility]  Subjective:  KELSHA OLDER is a 28 y.o. [phone] at [redacted]w[redacted]d being seen today for ongoing prenatal care.  She is currently monitored for the following issues for this high-risk pregnancy and has History of premature delivery, currently pregnant; History of classical cesarean section; Supervision of high risk pregnancy, antepartum; History of gestational hypertension; Asthma; History of neonatal death; and Gestational hypertension on their problem list.  Patient reports no complaints.   Contractions: Not present. Vag. Bleeding: None.  Movement: Present. Denies leaking of fluid.   The following portions of the patient's history were reviewed and updated as appropriate: allergies, current medications, past family history, past medical history, past social history, past surgical history and problem list. Problem list updated.  Objective:   Vitals:   11/05/19 0834 11/05/19 0902  BP: (!) 145/92 (!) 138/94  Pulse: 76   Weight: 201 lb 11.2 oz (91.5 kg)     Fetal Status: Fetal Heart Rate (bpm): NST   Movement: Present     General:  Alert, oriented and cooperative. Patient is in no acute distress.  Skin: Skin is warm and dry. No rash noted.   Cardiovascular: Normal heart rate noted  Respiratory: Normal respiratory effort, no problems with respiration noted  Abdomen: Soft, gravid, appropriate for gestational age. Pain/Pressure: Present     Pelvic:  Cervical exam deferred        Extremities: Normal range of motion.     Mental Status: Normal mood and affect. Normal behavior. Normal judgment and thought content.   Urinalysis:      Assessment and Plan:  Pregnancy: [id number] at [redacted]w[redacted]d  1. Supervision of high risk pregnancy, antepartum Routine care.  - [place] MFM UA CORD DOPPLER; Future - [place] MFM FETAL BPP WO NON STRESS; Future - [place] MFM UA CORD DOPPLER; Future  2. Gestational hypertension, third trimester Mild range  BP today and no s/s of severe pre-eclampsia S/p bmz on 11/25 and 26 for possible severe pre-eclampsia in MAU Precautions given  3. History of premature delivery, currently pregnant Too late for 17p  4. History of classical cesarean section Scheduled for 37/0  5. Poor fetal growth affecting management of mother in third trimester, single or unspecified fetus Not set up for rpt ua doppler today. bpp 8/8. Will try and do for today or tomorrow. fkc given - [place] MFM UA CORD DOPPLER; Future - [place] MFM FETAL BPP WO NON STRESS; Future - [place] MFM UA CORD DOPPLER; Future  Preterm labor symptoms and general obstetric precautions including but not limited to vaginal bleeding, contractions, leaking of fluid and fetal movement were reviewed in detail with the patient. Please refer to After Visit Summary for other counseling recommendations.  Return in about 1 week (around 11/12/2019) for in person mfm bpp, ua dopplers and hrob visit with nst.   Aletha Halim, MD

## 2019-11-12 ENCOUNTER — Other Ambulatory Visit: Payer: Medicaid Other

## 2019-11-12 ENCOUNTER — Encounter: Payer: Medicaid Other | Admitting: Obstetrics and Gynecology

## 2019-11-13 ENCOUNTER — Encounter (HOSPITAL_COMMUNITY): Payer: Self-pay

## 2019-11-13 NOTE — Patient Instructions (Signed)
YAMILKA LOPICCOLO  11/13/2019   Your procedure is scheduled on:  11/24/2019  Arrive at 1:00PM at Entrance C on [address] at [facility]  and [address]. You are invited to use the FREE valet parking or use the Visitor's parking deck.  Pick up the phone at the desk and dial [phone].  Call this number if you have problems the morning of surgery: [phone]  Remember:   Do not eat food:(After Midnight) Desps de medianoche.  Do not drink clear liquids: (6 Hours before arrival) 6 horas ante llegada.  Take these medicines the morning of surgery with A SIP OF WATER:  none   Do not wear jewelry, make-up or nail polish.  Do not wear lotions, powders, or perfumes. Do not wear deodorant.  Do not shave 48 hours prior to surgery.  Do not bring valuables to the hospital.  [facility] is not   responsible for any belongings or valuables brought to the hospital.  Contacts, dentures or bridgework may not be worn into surgery.  Leave suitcase in the car. After surgery it may be brought to your room.  For patients admitted to the hospital, checkout time is 11:00 AM the day of              discharge.      Please read over the following fact sheets that you were given:     Preparing for Surgery

## 2019-11-15 ENCOUNTER — Encounter (HOSPITAL_COMMUNITY): Payer: Self-pay | Admitting: Family Medicine

## 2019-11-15 ENCOUNTER — Other Ambulatory Visit: Payer: Self-pay

## 2019-11-15 ENCOUNTER — Inpatient Hospital Stay (HOSPITAL_COMMUNITY)
Admission: AD | Admit: 2019-11-15 | Discharge: 2019-11-19 | DRG: 786 | Disposition: A | Discharge status: Home / Self Care | Payer: Medicaid Other | Attending: Family Medicine | Admitting: Family Medicine

## 2019-11-15 DIAGNOSIS — O134 Gestational [pregnancy-induced] hypertension without significant proteinuria, complicating childbirth: Secondary | ICD-10-CM | POA: Diagnosis present

## 2019-11-15 DIAGNOSIS — Z20828 Contact with and (suspected) exposure to other viral communicable diseases: Secondary | ICD-10-CM | POA: Diagnosis present

## 2019-11-15 DIAGNOSIS — F17210 Nicotine dependence, cigarettes, uncomplicated: Secondary | ICD-10-CM | POA: Diagnosis present

## 2019-11-15 DIAGNOSIS — E669 Obesity, unspecified: Secondary | ICD-10-CM | POA: Diagnosis present

## 2019-11-15 DIAGNOSIS — O4703 False labor before 37 completed weeks of gestation, third trimester: Secondary | ICD-10-CM | POA: Diagnosis present

## 2019-11-15 DIAGNOSIS — O133 Gestational [pregnancy-induced] hypertension without significant proteinuria, third trimester: Secondary | ICD-10-CM

## 2019-11-15 DIAGNOSIS — Z3A35 35 weeks gestation of pregnancy: Secondary | ICD-10-CM | POA: Diagnosis not present

## 2019-11-15 DIAGNOSIS — Z8759 Personal history of other complications of pregnancy, childbirth and the puerperium: Secondary | ICD-10-CM

## 2019-11-15 DIAGNOSIS — O34211 Maternal care for low transverse scar from previous cesarean delivery: Secondary | ICD-10-CM | POA: Diagnosis not present

## 2019-11-15 DIAGNOSIS — Z98891 History of uterine scar from previous surgery: Secondary | ICD-10-CM

## 2019-11-15 DIAGNOSIS — O0990 Supervision of high risk pregnancy, unspecified, unspecified trimester: Secondary | ICD-10-CM

## 2019-11-15 DIAGNOSIS — O139 Gestational [pregnancy-induced] hypertension without significant proteinuria, unspecified trimester: Secondary | ICD-10-CM | POA: Diagnosis present

## 2019-11-15 DIAGNOSIS — O9902 Anemia complicating childbirth: Secondary | ICD-10-CM | POA: Diagnosis present

## 2019-11-15 DIAGNOSIS — O99214 Obesity complicating childbirth: Secondary | ICD-10-CM | POA: Diagnosis present

## 2019-11-15 DIAGNOSIS — O99334 Smoking (tobacco) complicating childbirth: Secondary | ICD-10-CM | POA: Diagnosis present

## 2019-11-15 DIAGNOSIS — O09899 Supervision of other high risk pregnancies, unspecified trimester: Secondary | ICD-10-CM

## 2019-11-15 DIAGNOSIS — O34212 Maternal care for vertical scar from previous cesarean delivery: Principal | ICD-10-CM | POA: Diagnosis present

## 2019-11-15 DIAGNOSIS — D649 Anemia, unspecified: Secondary | ICD-10-CM | POA: Diagnosis present

## 2019-11-15 DIAGNOSIS — O365930 Maternal care for other known or suspected poor fetal growth, third trimester, not applicable or unspecified: Secondary | ICD-10-CM | POA: Diagnosis present

## 2019-11-15 LAB — COMPREHENSIVE METABOLIC PANEL
ALT: 9 U/L (ref 0–44)
AST: 13 U/L — ABNORMAL LOW (ref 15–41)
Albumin: 2.8 g/dL — ABNORMAL LOW (ref 3.5–5.0)
Alkaline Phosphatase: 110 U/L (ref 38–126)
Anion gap: 10 (ref 5–15)
BUN: <5 mg/dL — ABNORMAL LOW (ref 6–20)
CO2: 25 mmol/L (ref 22–32)
Calcium: 8.2 mg/dL — ABNORMAL LOW (ref 8.9–10.3)
Chloride: 104 mmol/L (ref 98–111)
Creatinine, Ser: 0.49 mg/dL (ref 0.44–1.00)
GFR calc Af Amer: >60 mL/min (ref >60)
GFR calc non Af Amer: >60 mL/min (ref >60)
Glucose, Bld: 76 mg/dL (ref 70–99)
Potassium: 3.6 mmol/L (ref 3.5–5.1)
Sodium: 139 mmol/L (ref 135–145)
Total Bilirubin: 0.4 mg/dL (ref 0.3–1.2)
Total Protein: 6.2 g/dL — ABNORMAL LOW (ref 6.5–8.1)

## 2019-11-15 LAB — URINALYSIS, ROUTINE W REFLEX MICROSCOPIC
Bilirubin Urine: NEGATIVE
Glucose, UA: NEGATIVE mg/dL
Ketones, ur: NEGATIVE mg/dL
Nitrite: NEGATIVE
Protein, ur: NEGATIVE mg/dL
Specific Gravity, Urine: 1.013 (ref 1.005–1.030)
pH: 5 (ref 5.0–8.0)

## 2019-11-15 LAB — CBC
HCT: 31.6 % — ABNORMAL LOW (ref 36.0–46.0)
Hemoglobin: 10.8 g/dL — ABNORMAL LOW (ref 12.0–15.0)
MCH: 29.4 pg (ref 26.0–34.0)
MCHC: 34.2 g/dL (ref 30.0–36.0)
MCV: 86.1 fL (ref 80.0–100.0)
Platelets: 172 10*3/uL (ref 150–400)
RBC: 3.67 MIL/uL — ABNORMAL LOW (ref 3.87–5.11)
RDW: 11.9 % (ref 11.5–15.5)
WBC: 11.1 10*3/uL — ABNORMAL HIGH (ref 4.0–10.5)
nRBC: 0 % (ref 0.0–0.2)

## 2019-11-15 LAB — PROTEIN / CREATININE RATIO, URINE
Creatinine, Urine: 98.35 mg/dL
Protein Creatinine Ratio: 0.08 mg/mg{Cre} (ref 0.00–0.15)
Total Protein, Urine: 8 mg/dL

## 2019-11-15 LAB — TYPE AND SCREEN
ABO/RH(D): A POS
Antibody Screen: NEGATIVE

## 2019-11-15 MED ORDER — LACTATED RINGERS IV SOLN
INTRAVENOUS | Status: DC
  Administered 2019-11-15 (×2): via INTRAVENOUS

## 2019-11-15 MED ORDER — BUTORPHANOL TARTRATE 1 MG/ML IJ SOLN
2.0000 mg | Freq: Once | INTRAMUSCULAR | Status: AC
  Administered 2019-11-15: 2 mg via INTRAVENOUS
  Filled 2019-11-15: qty 2

## 2019-11-15 MED ORDER — CALCIUM CARBONATE ANTACID 500 MG PO CHEW: 2.0000 | CHEWABLE_TABLET | ORAL | Status: DC | PRN

## 2019-11-15 MED ORDER — ACETAMINOPHEN 325 MG PO TABS
650.0000 mg | ORAL_TABLET | ORAL | Status: DC | PRN
  Administered 2019-11-15 – 2019-11-17 (×4): 650 mg via ORAL
  Filled 2019-11-15 (×4): qty 2

## 2019-11-15 MED ORDER — DOCUSATE SODIUM 100 MG PO CAPS
100.0000 mg | ORAL_CAPSULE | Freq: Every day | ORAL | Status: DC
  Administered 2019-11-17 – 2019-11-19 (×3): 100 mg via ORAL
  Filled 2019-11-15 (×3): qty 1

## 2019-11-15 MED ORDER — LACTATED RINGERS IV BOLUS
1000.0000 mL | Freq: Once | INTRAVENOUS | Status: AC
  Administered 2019-11-15: 1000 mL via INTRAVENOUS

## 2019-11-15 MED ORDER — PROMETHAZINE HCL 25 MG/ML IJ SOLN
12.5000 mg | Freq: Once | INTRAMUSCULAR | Status: AC
  Administered 2019-11-15: 12.5 mg via INTRAVENOUS
  Filled 2019-11-15: qty 1

## 2019-11-15 MED ORDER — ZOLPIDEM TARTRATE 5 MG PO TABS
5.0000 mg | ORAL_TABLET | Freq: Every evening | ORAL | Status: DC | PRN
  Administered 2019-11-15: 21:00:00 5 mg via ORAL
  Filled 2019-11-15: qty 1

## 2019-11-15 MED ORDER — PRENATAL MULTIVITAMIN CH: 1.0000 | ORAL_TABLET | Freq: Every day | ORAL | Status: DC

## 2019-11-15 NOTE — H&P (Addendum)
Stacy Christian is a 28 y.o. female [phone] at [redacted]w[redacted]d presenting for preterm labor with hx classical cesarean. She reports onset of contractions 30 minutes apart that progressively became stronger and closer together. Her cervix remained unchanged in 5+ hours in MAU but contractions were 5-7 minutes apart and pt pain was worsening.  She denies any s/sx of PEC. Given poor obstetric hx with 2 prior preterm deliveries, the first a classical c/s incisional 23 weeks 4 days with neonatal demise, and severe preeclampsia with 27 week delivery with second pregnancy, will admit for observation.   Nursing Staff Provider  Office Location  [facility] Dating  LMP + 8 wk [place]  Language   English Anatomy US  WNL--f/u at 28 wks  Flu Vaccine  08/06/2019 Genetic Screen  NIPS:  WNL     TDaP vaccine   10/15/19 Hgb A1C or  GTT Early 5.0 A1c Third trimester: 68/100/81  Rhogam  n/a   LAB RESULTS   Feeding Plan Breast Blood Type A/Positive/-- (09/02 1456)   Contraception depo Antibody Negative (09/02 1456)  Circumcision Yes Rubella 1.96 (09/02 1456)  Pediatrician  Vowinckel RPR Non Reactive (09/02 1456)   Support Person Rochelle(MOM) HBsAg Negative (09/02 1456)   Prenatal Classes No HIV Non Reactive (09/02 1456)  BTL Consent n/a GBS    VBAC Consent n/a Pap 08/2017 nl    Hgb Electro    BP Cuff Given on 08/06/2019 CF     SMA     Waterbirth  [ ]  Class [ ]  Consent [ ]  CNM visit    OB History    Gravida  4   Para  2   Term      Preterm  2   AB  1   Living  1     SAB  1   TAB      Ectopic      Multiple      Live Births  2          Past Medical History:  Diagnosis Date  . Asthma   . Preeclampsia   . Preterm labor    Past Surgical History:  Procedure Laterality Date  . CESAREAN SECTION    . TONSILLECTOMY     Family History: family history includes Cancer in her maternal grandfather; Diabetes in her father, maternal grandmother, and mother; Heart disease in her father, paternal  grandfather, and paternal grandmother; Hypertension in her father and mother. Social History:  reports that she has been smoking cigarettes. She has been smoking about 0.15 packs per day. She has never used smokeless tobacco. She reports previous drug use. Frequency: 3.00 times per week. Drug: Marijuana. She reports that she does not drink alcohol.     Maternal Diabetes: No Genetic Screening: Normal Maternal Ultrasounds/Referrals: IUGR Fetal Ultrasounds or other Referrals:  None Maternal Substance Abuse:  No Significant Maternal Medications:  None Significant Maternal Lab Results:  None Other Comments:  None  Review of Systems  Constitutional: Negative for chills, fatigue and fever.  Eyes: Negative for visual disturbance.  Respiratory: Negative for shortness of breath.   Cardiovascular: Negative for chest pain.  Gastrointestinal: Positive for abdominal pain. Negative for vomiting.  Genitourinary: Positive for pelvic pain. Negative for difficulty urinating, dysuria, flank pain, vaginal bleeding, vaginal discharge and vaginal pain.  Musculoskeletal: Positive for back pain.  Neurological: Negative for dizziness and headaches.  Psychiatric/Behavioral: Negative.    Maternal Medical History:  Reason for admission: Contractions.   Contractions: Onset was  6-12 hours ago.   Frequency: regular.   Perceived severity is moderate.    Fetal activity: Perceived fetal activity is normal.   Last perceived fetal movement was within the past hour.    Prenatal complications: PIH and IUGR.   Prenatal Complications - Diabetes: none.    Dilation: 2 Effacement (%): 50 Station: -2 Exam by:: L. Courtney ParisLeftwich Kirby, CNM Blood pressure 133/84, pulse 76, temperature 99.1 F (37.3 C), temperature source Oral, resp. rate 18, height 5\' 6"  (1.676 m), weight 90 kg, last menstrual period 03/10/2019, SpO2 98 %, unknown if currently breastfeeding. Maternal Exam:  Uterine Assessment: Contraction strength is  mild.  Contraction frequency is regular.   Abdomen: Fetal presentation: vertex  Cervix: Cervix evaluated by digital exam.     Fetal Exam Fetal Monitor Review: Mode: ultrasound.   Baseline rate: 135.  Variability: moderate (6-25 bpm).   Pattern: accelerations present and no decelerations.    Fetal State Assessment: Category I - tracings are normal.     Physical Exam  Nursing note and vitals reviewed. Constitutional: She is oriented to person, place, and time. She appears well-developed and well-nourished.  Cardiovascular: Normal rate, regular rhythm and normal heart sounds.  Respiratory: Effort normal and breath sounds normal.  GI: Soft.  Musculoskeletal:        General: Normal range of motion.     Cervical back: Normal range of motion.  Neurological: She is alert and oriented to person, place, and time.  Skin: Skin is warm and dry.  Psychiatric: She has a normal mood and affect. Her behavior is normal. Judgment and thought content normal.    Results for orders placed or performed during the hospital encounter of 11/15/19 (from the past 24 hour(s))  Urinalysis, Routine w reflex microscopic     Status: Abnormal   Collection Time: 11/15/19  1:44 PM  Result Value Ref Range   Color, Urine YELLOW YELLOW   APPearance CLOUDY (A) CLEAR   Specific Gravity, Urine 1.013 1.005 - 1.030   pH 5.0 5.0 - 8.0   Glucose, UA NEGATIVE NEGATIVE mg/dL   Hgb urine dipstick SMALL (A) NEGATIVE   Bilirubin Urine NEGATIVE NEGATIVE   Ketones, ur NEGATIVE NEGATIVE mg/dL   Protein, ur NEGATIVE NEGATIVE mg/dL   Nitrite NEGATIVE NEGATIVE   Leukocytes,Ua MODERATE (A) NEGATIVE   RBC / HPF 0-5 0 - 5 RBC/hpf   WBC, UA 6-10 0 - 5 WBC/hpf   Bacteria, UA RARE (A) NONE SEEN   Squamous Epithelial / LPF 21-50 0 - 5   Mucus PRESENT   Protein / creatinine ratio, urine     Status: None   Collection Time: 11/15/19  1:44 PM  Result Value Ref Range   Creatinine, Urine 98.35 mg/dL   Total Protein, Urine 8 mg/dL    Protein Creatinine Ratio 0.08 0.00 - 0.15 mg/mg[Cre]  CBC     Status: Abnormal   Collection Time: 11/15/19  5:42 PM  Result Value Ref Range   WBC 11.1 (H) 4.0 - 10.5 K/uL   RBC 3.67 (L) 3.87 - 5.11 MIL/uL   Hemoglobin 10.8 (L) 12.0 - 15.0 g/dL   HCT 16.131.6 (L) 09.636.0 - 04.546.0 %   MCV 86.1 80.0 - 100.0 fL   MCH 29.4 26.0 - 34.0 pg   MCHC 34.2 30.0 - 36.0 g/dL   RDW 40.911.9 81.111.5 - 91.415.5 %   Platelets 172 150 - 400 K/uL   nRBC 0.0 0.0 - 0.2 %  Comprehensive metabolic panel  Status: Abnormal   Collection Time: 11/15/19  5:42 PM  Result Value Ref Range   Sodium 139 135 - 145 mmol/L   Potassium 3.6 3.5 - 5.1 mmol/L   Chloride 104 98 - 111 mmol/L   CO2 25 22 - 32 mmol/L   Glucose, Bld 76 70 - 99 mg/dL   BUN <5 (L) 6 - 20 mg/dL   Creatinine, Ser 3.15 0.44 - 1.00 mg/dL   Calcium 8.2 (L) 8.9 - 10.3 mg/dL   Total Protein 6.2 (L) 6.5 - 8.1 g/dL   Albumin 2.8 (L) 3.5 - 5.0 g/dL   AST 13 (L) 15 - 41 U/L   ALT 9 0 - 44 U/L   Alkaline Phosphatase 110 38 - 126 U/L   Total Bilirubin 0.4 0.3 - 1.2 mg/dL   GFR calc non Af Amer >60 >60 mL/min   GFR calc Af Amer >60 >60 mL/min   Anion gap 10 5 - 15   Prenatal labs: ABO, Rh: A/Positive/-- (09/02 1456) Antibody: Negative (09/02 1456) Rubella: 1.96 (09/02 1456) RPR: Non Reactive (10/28 1041)  HBsAg: Negative (09/02 1456)  HIV: Non Reactive (10/28 1041)  GBS:   Unknown  Assessment/Plan: [id number] at [redacted]w[redacted]d with threatened preterm labor Hx classical cesarean Poor obstetrical hx  PEC labs wnl, with P/C ratio of 0.8 Consult Dr Shawnie Pons with assessment and findings  Admit to HROB Unit for observation Continuous EFM and toco NPO LR at 125  [facility] 11/15/2019, 7:55 PM

## 2019-11-15 NOTE — MAU Note (Signed)
Stacy Christian is a 28 y.o. at [redacted]w[redacted]d here in MAU reporting: contractions since 0500. States they are getting closer, every 30 minutes or so. No LOF, VB, or discharge.  Onset of complaint: today  Pain score: 7/10  Vitals:   11/15/19 1335  BP: 138/87  Pulse: 100  Resp: 18  Temp: 99.1 F (37.3 C)  SpO2: 99%     FHT: +FM  Lab orders placed from triage: UA

## 2019-11-16 ENCOUNTER — Encounter (HOSPITAL_COMMUNITY)
Admission: AD | Disposition: A | Discharge status: Home / Self Care | Payer: Self-pay | Source: Home / Self Care | Attending: Family Medicine

## 2019-11-16 ENCOUNTER — Observation Stay (HOSPITAL_COMMUNITY): Payer: Medicaid Other | Admitting: Anesthesiology

## 2019-11-16 ENCOUNTER — Encounter (HOSPITAL_COMMUNITY): Payer: Self-pay | Admitting: Family Medicine

## 2019-11-16 DIAGNOSIS — Z3A35 35 weeks gestation of pregnancy: Secondary | ICD-10-CM | POA: Diagnosis not present

## 2019-11-16 DIAGNOSIS — Z20828 Contact with and (suspected) exposure to other viral communicable diseases: Secondary | ICD-10-CM | POA: Diagnosis present

## 2019-11-16 DIAGNOSIS — O9902 Anemia complicating childbirth: Secondary | ICD-10-CM | POA: Diagnosis present

## 2019-11-16 DIAGNOSIS — O99214 Obesity complicating childbirth: Secondary | ICD-10-CM | POA: Diagnosis present

## 2019-11-16 DIAGNOSIS — E669 Obesity, unspecified: Secondary | ICD-10-CM | POA: Diagnosis present

## 2019-11-16 DIAGNOSIS — O365930 Maternal care for other known or suspected poor fetal growth, third trimester, not applicable or unspecified: Secondary | ICD-10-CM | POA: Diagnosis present

## 2019-11-16 DIAGNOSIS — O99334 Smoking (tobacco) complicating childbirth: Secondary | ICD-10-CM | POA: Diagnosis present

## 2019-11-16 DIAGNOSIS — F17210 Nicotine dependence, cigarettes, uncomplicated: Secondary | ICD-10-CM | POA: Diagnosis present

## 2019-11-16 DIAGNOSIS — O34212 Maternal care for vertical scar from previous cesarean delivery: Secondary | ICD-10-CM | POA: Diagnosis not present

## 2019-11-16 DIAGNOSIS — Z98891 History of uterine scar from previous surgery: Secondary | ICD-10-CM

## 2019-11-16 DIAGNOSIS — O134 Gestational [pregnancy-induced] hypertension without significant proteinuria, complicating childbirth: Secondary | ICD-10-CM | POA: Diagnosis not present

## 2019-11-16 DIAGNOSIS — O34211 Maternal care for low transverse scar from previous cesarean delivery: Secondary | ICD-10-CM | POA: Diagnosis not present

## 2019-11-16 DIAGNOSIS — D649 Anemia, unspecified: Secondary | ICD-10-CM | POA: Diagnosis present

## 2019-11-16 LAB — RESPIRATORY PANEL BY RT PCR (FLU A&B, COVID)
Influenza A by PCR: NEGATIVE
Influenza B by PCR: NEGATIVE
SARS Coronavirus 2 by RT PCR: NEGATIVE

## 2019-11-16 LAB — SARS CORONAVIRUS 2 (TAT 6-24 HRS): SARS Coronavirus 2: NEGATIVE

## 2019-11-16 LAB — CBC
HCT: 30.4 % — ABNORMAL LOW (ref 36.0–46.0)
Hemoglobin: 10.4 g/dL — ABNORMAL LOW (ref 12.0–15.0)
MCH: 29.4 pg (ref 26.0–34.0)
MCHC: 34.2 g/dL (ref 30.0–36.0)
MCV: 85.9 fL (ref 80.0–100.0)
Platelets: 196 10*3/uL (ref 150–400)
RBC: 3.54 MIL/uL — ABNORMAL LOW (ref 3.87–5.11)
RDW: 12 % (ref 11.5–15.5)
WBC: 14.3 10*3/uL — ABNORMAL HIGH (ref 4.0–10.5)
nRBC: 0 % (ref 0.0–0.2)

## 2019-11-16 LAB — RPR: RPR Ser Ql: NONREACTIVE

## 2019-11-16 LAB — ABO/RH: ABO/RH(D): A POS

## 2019-11-16 SURGERY — Surgical Case
Anesthesia: Spinal

## 2019-11-16 MED ORDER — DEXAMETHASONE SODIUM PHOSPHATE 4 MG/ML IJ SOLN
INTRAMUSCULAR | Status: AC
  Filled 2019-11-16: qty 1

## 2019-11-16 MED ORDER — BUPIVACAINE HCL (PF) 0.25 % IJ SOLN
INTRAMUSCULAR | Status: DC | PRN
  Administered 2019-11-16: 30 mL

## 2019-11-16 MED ORDER — LACTATED RINGERS IV SOLN
INTRAVENOUS | Status: DC | PRN
  Administered 2019-11-16: 01:00:00 via INTRAVENOUS

## 2019-11-16 MED ORDER — TRANEXAMIC ACID-NACL 1000-0.7 MG/100ML-% IV SOLN
1000.0000 mg | INTRAVENOUS | Status: AC
  Administered 2019-11-16: 1000 mg via INTRAVENOUS

## 2019-11-16 MED ORDER — SOD CITRATE-CITRIC ACID 500-334 MG/5ML PO SOLN
30.0000 mL | ORAL | Status: AC
  Administered 2019-11-16: 30 mL via ORAL
  Filled 2019-11-16: qty 30

## 2019-11-16 MED ORDER — SENNOSIDES-DOCUSATE SODIUM 8.6-50 MG PO TABS
2.0000 | ORAL_TABLET | ORAL | Status: DC
  Administered 2019-11-16 – 2019-11-18 (×3): 2 via ORAL
  Filled 2019-11-16 (×3): qty 2

## 2019-11-16 MED ORDER — METOCLOPRAMIDE HCL 5 MG/ML IJ SOLN
INTRAMUSCULAR | Status: AC
  Filled 2019-11-16: qty 4

## 2019-11-16 MED ORDER — SOD CITRATE-CITRIC ACID 500-334 MG/5ML PO SOLN: 30.0000 mL | Freq: Once | ORAL | Status: AC

## 2019-11-16 MED ORDER — NALBUPHINE HCL 10 MG/ML IJ SOLN
5.0000 mg | Freq: Once | INTRAMUSCULAR | Status: AC | PRN
  Administered 2019-11-16: 5 mg via INTRAVENOUS

## 2019-11-16 MED ORDER — HYDROCODONE-ACETAMINOPHEN 5-325 MG PO TABS
1.0000 | ORAL_TABLET | ORAL | Status: DC | PRN
  Administered 2019-11-18: 1 via ORAL
  Administered 2019-11-18 (×3): 2 via ORAL
  Administered 2019-11-19: 1 via ORAL
  Filled 2019-11-16: qty 1
  Filled 2019-11-16 (×3): qty 2
  Filled 2019-11-16: qty 1

## 2019-11-16 MED ORDER — SODIUM CHLORIDE 0.9 % IV SOLN
500.0000 mg | INTRAVENOUS | Status: AC
  Administered 2019-11-16: 500 mg via INTRAVENOUS
  Filled 2019-11-16: qty 500

## 2019-11-16 MED ORDER — NALBUPHINE HCL 10 MG/ML IJ SOLN: 5.0000 mg | Freq: Once | INTRAMUSCULAR | Status: AC | PRN

## 2019-11-16 MED ORDER — PHENYLEPHRINE HCL-NACL 20-0.9 MG/250ML-% IV SOLN
INTRAVENOUS | Status: DC | PRN
  Administered 2019-11-16: 30 ug/min via INTRAVENOUS

## 2019-11-16 MED ORDER — NALOXONE HCL 0.4 MG/ML IJ SOLN: 0.4000 mg | INTRAMUSCULAR | Status: DC | PRN

## 2019-11-16 MED ORDER — SIMETHICONE 80 MG PO CHEW
80.0000 mg | CHEWABLE_TABLET | Freq: Three times a day (TID) | ORAL | Status: DC
  Administered 2019-11-16 – 2019-11-19 (×9): 80 mg via ORAL
  Filled 2019-11-16 (×9): qty 1

## 2019-11-16 MED ORDER — FENTANYL CITRATE (PF) 100 MCG/2ML IJ SOLN: 25.0000 ug | INTRAMUSCULAR | Status: DC | PRN

## 2019-11-16 MED ORDER — BUPIVACAINE HCL (PF) 0.25 % IJ SOLN
INTRAMUSCULAR | Status: AC
  Filled 2019-11-16: qty 30

## 2019-11-16 MED ORDER — BUPIVACAINE IN DEXTROSE 0.75-8.25 % IT SOLN
INTRATHECAL | Status: DC | PRN
  Administered 2019-11-16: 1.7 mL via INTRATHECAL

## 2019-11-16 MED ORDER — SIMETHICONE 80 MG PO CHEW: 80.0000 mg | CHEWABLE_TABLET | ORAL | Status: DC | PRN

## 2019-11-16 MED ORDER — FENTANYL CITRATE (PF) 100 MCG/2ML IJ SOLN
INTRAMUSCULAR | Status: AC
  Filled 2019-11-16: qty 2

## 2019-11-16 MED ORDER — IBUPROFEN 800 MG PO TABS
800.0000 mg | ORAL_TABLET | Freq: Three times a day (TID) | ORAL | Status: DC
  Administered 2019-11-16 – 2019-11-19 (×10): 800 mg via ORAL
  Filled 2019-11-16 (×11): qty 1

## 2019-11-16 MED ORDER — KETOROLAC TROMETHAMINE 30 MG/ML IJ SOLN: 30.0000 mg | Freq: Four times a day (QID) | INTRAMUSCULAR | Status: AC

## 2019-11-16 MED ORDER — COCONUT OIL OIL: 1.0000 "application " | TOPICAL_OIL | Status: DC | PRN

## 2019-11-16 MED ORDER — ONDANSETRON HCL 4 MG/2ML IJ SOLN
INTRAMUSCULAR | Status: AC
  Filled 2019-11-16: qty 2

## 2019-11-16 MED ORDER — SODIUM CHLORIDE 0.9% FLUSH: 3.0000 mL | INTRAVENOUS | Status: DC | PRN

## 2019-11-16 MED ORDER — NALBUPHINE HCL 10 MG/ML IJ SOLN
INTRAMUSCULAR | Status: AC
  Filled 2019-11-16: qty 1

## 2019-11-16 MED ORDER — OXYTOCIN 40 UNITS IN NORMAL SALINE INFUSION - SIMPLE MED
INTRAVENOUS | Status: AC
  Filled 2019-11-16: qty 1000

## 2019-11-16 MED ORDER — DIPHENHYDRAMINE HCL 25 MG PO CAPS: 25.0000 mg | ORAL_CAPSULE | ORAL | Status: DC | PRN

## 2019-11-16 MED ORDER — TRANEXAMIC ACID-NACL 1000-0.7 MG/100ML-% IV SOLN
INTRAVENOUS | Status: AC
  Filled 2019-11-16: qty 100

## 2019-11-16 MED ORDER — SODIUM CHLORIDE 0.9 % IV SOLN
INTRAVENOUS | Status: DC | PRN
  Administered 2019-11-16: 02:00:00 via INTRAVENOUS

## 2019-11-16 MED ORDER — WITCH HAZEL-GLYCERIN EX PADS: 1.0000 "application " | MEDICATED_PAD | CUTANEOUS | Status: DC | PRN

## 2019-11-16 MED ORDER — MORPHINE SULFATE (PF) 0.5 MG/ML IJ SOLN
INTRAMUSCULAR | Status: DC | PRN
  Administered 2019-11-16: .15 mg via INTRATHECAL

## 2019-11-16 MED ORDER — PHENYLEPHRINE HCL-NACL 20-0.9 MG/250ML-% IV SOLN
INTRAVENOUS | Status: AC
  Filled 2019-11-16: qty 250

## 2019-11-16 MED ORDER — NALOXONE HCL 4 MG/10ML IJ SOLN
1.0000 ug/kg/h | INTRAVENOUS | Status: DC | PRN
  Filled 2019-11-16: qty 5

## 2019-11-16 MED ORDER — KETOROLAC TROMETHAMINE 30 MG/ML IJ SOLN
30.0000 mg | Freq: Four times a day (QID) | INTRAMUSCULAR | Status: AC | PRN
  Administered 2019-11-16: 30 mg via INTRAMUSCULAR

## 2019-11-16 MED ORDER — ONDANSETRON HCL 4 MG/2ML IJ SOLN: 4.0000 mg | Freq: Three times a day (TID) | INTRAMUSCULAR | Status: DC | PRN

## 2019-11-16 MED ORDER — ONDANSETRON HCL 4 MG/2ML IJ SOLN
INTRAMUSCULAR | Status: DC | PRN
  Administered 2019-11-16: 4 mg via INTRAVENOUS

## 2019-11-16 MED ORDER — NALBUPHINE HCL 10 MG/ML IJ SOLN: 5.0000 mg | INTRAMUSCULAR | Status: DC | PRN

## 2019-11-16 MED ORDER — SIMETHICONE 80 MG PO CHEW
80.0000 mg | CHEWABLE_TABLET | ORAL | Status: DC
  Administered 2019-11-16 – 2019-11-18 (×3): 80 mg via ORAL
  Filled 2019-11-16 (×3): qty 1

## 2019-11-16 MED ORDER — PRENATAL MULTIVITAMIN CH
1.0000 | ORAL_TABLET | Freq: Every day | ORAL | Status: DC
  Administered 2019-11-16 – 2019-11-18 (×3): 1 via ORAL
  Filled 2019-11-16 (×3): qty 1

## 2019-11-16 MED ORDER — DEXAMETHASONE SODIUM PHOSPHATE 4 MG/ML IJ SOLN
INTRAMUSCULAR | Status: DC | PRN
  Administered 2019-11-16: 4 mg via INTRAVENOUS

## 2019-11-16 MED ORDER — CEFAZOLIN SODIUM-DEXTROSE 2-4 GM/100ML-% IV SOLN: 2.0000 g | INTRAVENOUS | Status: DC

## 2019-11-16 MED ORDER — ENOXAPARIN SODIUM 40 MG/0.4ML ~~LOC~~ SOLN
40.0000 mg | SUBCUTANEOUS | Status: DC
  Administered 2019-11-17 – 2019-11-18 (×2): 40 mg via SUBCUTANEOUS
  Filled 2019-11-16 (×2): qty 0.4

## 2019-11-16 MED ORDER — OXYTOCIN 40 UNITS IN NORMAL SALINE INFUSION - SIMPLE MED
INTRAVENOUS | Status: DC | PRN
  Administered 2019-11-16: 40 mL via INTRAVENOUS

## 2019-11-16 MED ORDER — CEFAZOLIN SODIUM-DEXTROSE 2-3 GM-%(50ML) IV SOLR
INTRAVENOUS | Status: DC | PRN
  Administered 2019-11-16: 2 g via INTRAVENOUS

## 2019-11-16 MED ORDER — LACTATED RINGERS IV SOLN: INTRAVENOUS | Status: DC

## 2019-11-16 MED ORDER — DIPHENHYDRAMINE HCL 50 MG/ML IJ SOLN: 12.5000 mg | INTRAMUSCULAR | Status: DC | PRN

## 2019-11-16 MED ORDER — DIPHENHYDRAMINE HCL 25 MG PO CAPS: 25.0000 mg | ORAL_CAPSULE | Freq: Four times a day (QID) | ORAL | Status: DC | PRN

## 2019-11-16 MED ORDER — DIBUCAINE (PERIANAL) 1 % EX OINT: 1.0000 "application " | TOPICAL_OINTMENT | CUTANEOUS | Status: DC | PRN

## 2019-11-16 MED ORDER — KETOROLAC TROMETHAMINE 30 MG/ML IJ SOLN: 30.0000 mg | Freq: Four times a day (QID) | INTRAMUSCULAR | Status: AC | PRN

## 2019-11-16 MED ORDER — BUPIVACAINE HCL (PF) 0.5 % IJ SOLN
INTRAMUSCULAR | Status: AC
  Filled 2019-11-16: qty 30

## 2019-11-16 MED ORDER — FENTANYL CITRATE (PF) 100 MCG/2ML IJ SOLN
INTRAMUSCULAR | Status: DC | PRN
  Administered 2019-11-16: 15 ug via INTRATHECAL

## 2019-11-16 MED ORDER — OXYTOCIN 40 UNITS IN NORMAL SALINE INFUSION - SIMPLE MED: 2.5000 [IU]/h | INTRAVENOUS | Status: AC

## 2019-11-16 MED ORDER — MORPHINE SULFATE (PF) 0.5 MG/ML IJ SOLN
INTRAMUSCULAR | Status: AC
  Filled 2019-11-16: qty 10

## 2019-11-16 MED ORDER — MENTHOL 3 MG MT LOZG: 1.0000 | LOZENGE | OROMUCOSAL | Status: DC | PRN

## 2019-11-16 MED ORDER — MEPERIDINE HCL 25 MG/ML IJ SOLN: 6.2500 mg | INTRAMUSCULAR | Status: DC | PRN

## 2019-11-16 MED ORDER — PROPOFOL 10 MG/ML IV BOLUS
INTRAVENOUS | Status: AC
  Filled 2019-11-16: qty 20

## 2019-11-16 MED ORDER — SODIUM CHLORIDE 0.9 % IR SOLN
Status: DC | PRN
  Administered 2019-11-16: 1000 mL

## 2019-11-16 MED ORDER — KETOROLAC TROMETHAMINE 30 MG/ML IJ SOLN
INTRAMUSCULAR | Status: AC
  Filled 2019-11-16: qty 1

## 2019-11-16 SURGICAL SUPPLY — 36 items
BENZOIN TINCTURE PRP APPL 2/3 (GAUZE/BANDAGES/DRESSINGS) ×3 IMPLANT
CHLORAPREP W/TINT 26ML (MISCELLANEOUS) ×3 IMPLANT
CLAMP CORD UMBIL (MISCELLANEOUS) IMPLANT
CLOSURE STERI STRIP 1/2 X4 (GAUZE/BANDAGES/DRESSINGS) ×3 IMPLANT
CLOTH BEACON ORANGE TIMEOUT ST (SAFETY) ×3 IMPLANT
DRSG OPSITE POSTOP 4X10 (GAUZE/BANDAGES/DRESSINGS) ×3 IMPLANT
ELECT REM PT RETURN 9FT ADLT (ELECTROSURGICAL) ×3
ELECTRODE REM PT RTRN 9FT ADLT (ELECTROSURGICAL) ×1 IMPLANT
EXTRACTOR VACUUM M CUP 4 TUBE (SUCTIONS) IMPLANT
EXTRACTOR VACUUM M CUP 4' TUBE (SUCTIONS)
GLOVE BIOGEL PI IND STRL 7.0 (GLOVE) ×2 IMPLANT
GLOVE BIOGEL PI INDICATOR 7.0 (GLOVE) ×4
GLOVE ECLIPSE 7.0 STRL STRAW (GLOVE) ×6 IMPLANT
GOWN STRL REUS W/TWL LRG LVL3 (GOWN DISPOSABLE) ×6 IMPLANT
KIT ABG SYR 3ML LUER SLIP (SYRINGE) IMPLANT
NEEDLE HYPO 22GX1.5 SAFETY (NEEDLE) ×3 IMPLANT
NEEDLE HYPO 25X5/8 SAFETYGLIDE (NEEDLE) IMPLANT
NS IRRIG 1000ML POUR BTL (IV SOLUTION) ×3 IMPLANT
PACK C SECTION WH (CUSTOM PROCEDURE TRAY) ×3 IMPLANT
PAD ABD 7.5X8 STRL (GAUZE/BANDAGES/DRESSINGS) ×3 IMPLANT
PAD ABD 8X10 STRL (GAUZE/BANDAGES/DRESSINGS) ×3 IMPLANT
PAD OB MATERNITY 4.3X12.25 (PERSONAL CARE ITEMS) ×3 IMPLANT
PENCIL SMOKE EVAC W/HOLSTER (ELECTROSURGICAL) ×3 IMPLANT
RTRCTR C-SECT PINK 25CM LRG (MISCELLANEOUS) ×3 IMPLANT
SPONGE GAUZE 4X4 12PLY STER LF (GAUZE/BANDAGES/DRESSINGS) ×6 IMPLANT
STRIP CLOSURE SKIN 1/2X4 (GAUZE/BANDAGES/DRESSINGS) ×2 IMPLANT
SUT MNCRL 0 VIOLET CTX 36 (SUTURE) ×2 IMPLANT
SUT MONOCRYL 0 CTX 36 (SUTURE) ×4
SUT VIC AB 0 CTX 36 (SUTURE) ×2
SUT VIC AB 0 CTX36XBRD ANBCTRL (SUTURE) ×1 IMPLANT
SUT VIC AB 4-0 KS 27 (SUTURE) ×3 IMPLANT
SYR 30ML LL (SYRINGE) ×3 IMPLANT
TAPE CLOTH SURG 6X10 WHT LF (GAUZE/BANDAGES/DRESSINGS) ×3 IMPLANT
TOWEL OR 17X24 6PK STRL BLUE (TOWEL DISPOSABLE) ×3 IMPLANT
TRAY FOLEY W/BAG SLVR 14FR LF (SET/KITS/TRAYS/PACK) ×3 IMPLANT
WATER STERILE IRR 1000ML POUR (IV SOLUTION) ×3 IMPLANT

## 2019-11-16 NOTE — Progress Notes (Signed)
[place]  11/16/2019  Stacy Christian 12/12/1990 [id number]   [place]  11/16/2019  [name] 11/16/2019 [id number]   CSW received consult for history of marijuana use.  CSW will monitor CDS results (Drug Detection Panel, Umbilical Cord Qualitative, THC-COOH, Cord Qualitative, Urine Rapid Drug Screen) and make report to the [place] [employer], if warranted.  According to History & Physical, MOB "reports that she has been smoking cigarettes, smoking about 0.15 packs per day and previous drug use(marijuana), 3.00 times per week".   CSW will also meet with MOB if results are positive, informing MOB of CSW's legal obligation to report substance use/abuse during pregnancy.  CSW will provide MOB with substance abuse resources/treatment facilities.  Nat Christen, BSW, MSW, [employer]  Licensed [employer] representative  Cell # [phone]  [name]

## 2019-11-16 NOTE — Transfer of Care (Signed)
Immediate Anesthesia Transfer of Care Note  Patient: Stacy Christian  Procedure(s) Performed: CESAREAN SECTION (N/A )  Patient Location: PACU  Anesthesia Type:Spinal  Level of Consciousness: awake, alert  and patient cooperative  Airway & Oxygen Therapy: Patient Spontanous Breathing  Post-op Assessment: Report given to RN and Post -op Vital signs reviewed and stable  Post vital signs: Reviewed and stable  Last Vitals:  Vitals Value Taken Time  BP 144/75 11/16/19 0253  Temp    Pulse 79 11/16/19 0259  Resp 19 11/16/19 0259  SpO2 100 % 11/16/19 0259  Vitals shown include unvalidated device data.  Last Pain:  Vitals:   11/15/19 2148  TempSrc:   PainSc: 2       Patients Stated Pain Goal: 2 (43/15/40 0867)  Complications: No apparent anesthesia complications

## 2019-11-16 NOTE — Anesthesia Preprocedure Evaluation (Signed)
Anesthesia Evaluation  Patient identified by MRN, date of birth, ID band Patient awake    Reviewed: Allergy & Precautions, NPO status , Patient's Chart, lab work & pertinent test results, reviewed documented beta blocker date and time   Airway Mallampati: II  TM Distance: >3 FB Neck ROM: Full    Dental no notable dental hx. (+) Teeth Intact   Pulmonary asthma , Current Smoker and Patient abstained from smoking.,    Pulmonary exam normal breath sounds clear to auscultation       Cardiovascular hypertension, Normal cardiovascular exam Rhythm:Regular Rate:Normal  Gestational HTN   Neuro/Psych negative neurological ROS  negative psych ROS   GI/Hepatic Neg liver ROS, GERD  ,  Endo/Other  Obesity  Renal/GU negative Renal ROS  negative genitourinary   Musculoskeletal negative musculoskeletal ROS (+)   Abdominal (+) + obese,   Peds  Hematology  (+) anemia ,   Anesthesia Other Findings   Reproductive/Obstetrics (+) Pregnancy Preterm Labor 35 5/7 weeks Hx/o Classical C/Section                             Anesthesia Physical Anesthesia Plan  ASA: II and emergent  Anesthesia Plan: Spinal   Post-op Pain Management:    Induction:   PONV Risk Score and Plan: 3 and Scopolamine patch - Pre-op, Ondansetron and Treatment may vary due to age or medical condition  Airway Management Planned: Natural Airway  Additional Equipment:   Intra-op Plan:   Post-operative Plan:   Informed Consent: I have reviewed the patients History and Physical, chart, labs and discussed the procedure including the risks, benefits and alternatives for the proposed anesthesia with the patient or authorized representative who has indicated his/her understanding and acceptance.       Plan Discussed with: Anesthesiologist, CRNA and Surgeon  Anesthesia Plan Comments:         Anesthesia Quick Evaluation

## 2019-11-16 NOTE — Progress Notes (Signed)
Patient took out IV, CNM Fatima Blank) aware, CNM does not want another IV started at this time. This RN will encourage patient to drink and take out Foley as appropriate based on orders and I&O status.

## 2019-11-16 NOTE — Anesthesia Postprocedure Evaluation (Signed)
Anesthesia Post Note  Patient: Stacy Christian  Procedure(s) Performed: CESAREAN SECTION (N/A )     Patient location during evaluation: PACU Anesthesia Type: Spinal Level of consciousness: awake and alert and oriented Pain management: pain level controlled Vital Signs Assessment: post-procedure vital signs reviewed and stable Respiratory status: nonlabored ventilation, spontaneous breathing and respiratory function stable Cardiovascular status: blood pressure returned to baseline and stable Postop Assessment: no headache, no backache, spinal receding, patient able to bend at knees and no apparent nausea or vomiting Anesthetic complications: no    Last Vitals:  Vitals:   11/16/19 0515 11/16/19 0539  BP: (!) 150/98 (!) 140/101  Pulse: 61 65  Resp: 20   Temp: 36.7 C   SpO2: 100%     Last Pain:  Vitals:   11/16/19 0515  TempSrc: Oral  PainSc: (P) 0-No pain   Pain Goal: Patients Stated Pain Goal: 2 (11/15/19 2100)  LLE Motor Response: Purposeful movement (11/16/19 0500) LLE Sensation: Tingling (11/16/19 0500) RLE Motor Response: Purposeful movement (11/16/19 0500) RLE Sensation: Tingling (11/16/19 0500)     Epidural/Spinal Function Cutaneous sensation: (P) Able to Wiggle Toes (11/16/19 0515), Patient able to flex knees: (P) Yes (11/16/19 0515), Patient able to lift hips off bed: (P) Yes (11/16/19 0515), Back pain beyond tenderness at insertion site: (P) No (11/16/19 0515), Progressively worsening motor and/or sensory loss: (P) No (11/16/19 0515), Bowel and/or bladder incontinence post epidural: (P) No (11/16/19 0515)  Kynisha Memon A.

## 2019-11-16 NOTE — Lactation Note (Signed)
This note was copied from a baby's chart. Lactation Consultation Note  Patient Name: Stacy Christian [id number] Date: 11/16/2019 Reason for consult: Late-preterm 34-36.6wks;Infant < 6lbs;Follow-up assessment  Mom bottle feeding infant when LC entered the room.  Discussed breastfeeding and hand exp. And use of DEBP.  LC reviewed importance of pumping and hand expressing afterwards.  Mom was interested in support groups/ information provided and written down for mom.  Website given with time/dates.    Mom denies needs at this time and is comfortable with pumping, (pumped with previous baby in NICU).    LC encouraged mom to visit OP LC after DC because mom states she does desire to bf infant from the breast, not strictly bottle feed.    LC reviewed LPTI guidelines with mom.  Encouraged mom to call out if needing assistance with bf prior to supplementation.  Mom denies further needs/questions/ or assistance at this time.     Maternal Data    Feeding    LATCH Score                   Interventions Interventions: DEBP  Lactation Tools Discussed/Used     Consult Status Consult Status: Follow-up Date: 11/17/19 Follow-up type: In-patient    [name] [facility] 11/16/2019, 8:18 PM

## 2019-11-16 NOTE — Anesthesia Procedure Notes (Signed)
Spinal  Patient location during procedure: OR Start time: 11/16/2019 1:34 AM End time: 11/16/2019 1:37 AM Staffing Performed: anesthesiologist  Anesthesiologist: Josephine Igo, MD Preanesthetic Checklist Completed: patient identified, IV checked, site marked, risks and benefits discussed, surgical consent, monitors and equipment checked, pre-op evaluation and timeout performed Spinal Block Patient position: sitting Prep: DuraPrep Patient monitoring: heart rate, cardiac monitor, continuous pulse ox and blood pressure Approach: midline Location: L4-5 Injection technique: single-shot Needle Needle type: Pencan  Needle gauge: 24 G Needle length: 9 cm Needle insertion depth: 7 cm Assessment Sensory level: T4 Additional Notes Patient tolerated procedure well. Adequate sensory level.

## 2019-11-16 NOTE — Lactation Note (Signed)
This note was copied from a baby's chart. Lactation Consultation Note  Patient Name: Stacy Christian [id number] Date: 11/16/2019 Reason for consult: Initial assessment;Late-preterm 34-36.6wks P2, 4 hour female infant. Per mom, infant been latching maybe 5 minutes most feedings. Mom's feeding choice at admission is breast and bottle feeding. Per mom, she  pumped  4 months with her almost 28 year old, due to infant being born at 4 weeks and in NICU Mom has DEBP at home. LC did not observe latch, as LC entered the room ,  per mom, she recently finished  she breastfeed infant for 5 minutes. RN was bottle feeding infant 5 mls of Similac Neosure 22 kcal formula with iron using a slow flow bottle nipple. Per mom, she knows how to hand expressed and she taught back, LC observed mom has flat nipples. Mom was given hand pump to pre-pump breast prior to latching infant and breast shells to wear in bra during the day.  LC discussed LPTI policy and mom understands to breastfeed according cues, 8 to 12 times within 24 hours and not exceed 3 hours without breastfeeding infant. Mom will do as much STS as possible, will not breastfeed infant past 30 minutes (green sheet given). Mom was using DEBP as LC was walking out of room and had expressed 3 mls of colostrum which she will offer after she breastfeeds infant at next feeding. Mom knows to call RN or LC if she has any questions, concerns or need assistance with latching infant at breast. Mom made aware of O/P services, breastfeeding support groups, community resources, and our phone # for post-discharge questions.  Mom's feeding plan for the  first 24 hours:  1. Mom will latch infant at breast and work on  increasing duration , if possible with stimulation for LPTI. 2. Mom will offer EBM first before supplementing with formula after she latches  infant at breast based on infant age/ hours of life for supplement. 3. Mom will use DEBP every 3 hours for 15  minutes on initial setting  pumping both breast.  Maternal Data Formula Feeding for Exclusion: Yes Reason for exclusion: Mother's choice to formula and breast feed on admission Has patient been taught Hand Expression?: Yes Does the patient have breastfeeding experience prior to this delivery?: Yes  Feeding Feeding Type: Breast Fed Nipple Type: Slow - flow  LATCH Score Latch: Grasps breast easily, tongue down, lips flanged, rhythmical sucking.  Audible Swallowing: A few with stimulation  Type of Nipple: Everted at rest and after stimulation  Comfort (Breast/Nipple): Soft / non-tender  Hold (Positioning): Full assist, staff holds infant at breast  LATCH Score: 7  Interventions Interventions: Breast feeding basics reviewed;Hand express;Expressed milk;Shells;Pre-pump if needed;DEBP;Hand pump  Lactation Tools Discussed/Used Tools: Shells;Pump Shell Type: Other (comment)(mom has flat nipples.) Breast pump type: Double-Electric Breast Pump WIC Program: No Pump Review: Setup, frequency, and cleaning;Milk Storage Initiated by:: Vicente Serene, IBCLC Date initiated:: 11/16/19   Consult Status Consult Status: Follow-up Date: 11/16/19 Follow-up type: In-patient    Vicente Serene 11/16/2019, 6:47 AM

## 2019-11-16 NOTE — Op Note (Signed)
Preoperative Diagnosis:  IUP @ [redacted]w[redacted]d, preterm labor, history of classical cesarean, history of cesarean x2  Postoperative Diagnosis:  Same  Procedure: Repeat low transverse cesarean section  Surgeon: Darron Doom, M.D.  Assistant: Clarnce Flock, M.D.  Anesthesia: spinal with Josephine Igo, MD  Findings: Viable female infant, APGAR (1 MIN): 8   APGAR (5 MINS): 8   APGAR (10 MINS): 9   Infant weight 2195 grams Normal tubes and ovaries  Estimated blood loss: 466 cc  Complications: None known  Specimens: Placenta to pathology  Reason for procedure: Briefly, the patient is a 28 y.o. [id number] at [redacted]w[redacted]d with history of two prior cesarean, the first of which was a classical, who presented with painful contractions. Initially changed cervix from 1.5>2 cm and admitted for observation to [facility] specialty care. After several hours complained of ongoing contractions and pain and on recheck was 3 cm, at which point she was taken for repeat cesarean. Patient counseled, r.e. Risks benefits of cesarean discussed with patient. The risks of cesarean section discussed with the patient included but were not limited to: bleeding which may require transfusion or reoperation; infection which may require antibiotics; injury to bowel, bladder, ureters or other surrounding organs; injury to the fetus; need for additional procedures including hysterectomy in the event of a life-threatening hemorrhage; placental abnormalities with subsequent pregnancies, incisional problems, thromboembolic phenomenon and other postoperative/anesthesia complications. The patient concurred with the proposed plan, giving informed written consent for the procedure.  Procedure: Patient is a to the OR where spinal analgesia was administered. She was then placed in a supine position with left lateral tilt. She received 2 g of Ancef, 500 mg Azithromycin, and SCDs were in place. A Foley catheter was placed in the bladder. She was prepped and  draped in the usual sterile fashion. A timeout was performed. A knife was then used to make a Pfannenstiel incision. This incision was carried out to underlying fascia which was divided in the midline with the knife. The incision was extended laterally, sharply. The fascia was dissected of the underlying rectus superiorly.  The rectus was divided in the midline.  The peritoneal cavity was entered bluntly.  Alexis retractor was placed inside the incision.  A knife was used to make a low transverse incision on the uterus. This incision was carried down to the amniotic cavity was entered. Fetus was in vertex position and was brought up out of the incision. Cord was clamped x 2 and cut. Infant taken to waiting pediatrician.  Cord blood was obtained. Placenta was delivered from the uterus.  Uterus was cleaned with dry lap pads. Uterine incision closed with 0 Monocryl suture in a locked running fashion. Alexis retractor was removed from the abdomen. Peritoneal closure was done with 0 Monocryl suture. Electrocautery and suture was used to obtain hemostasis.  Fascia is closed with 0 Vicryl suture in a running fashion. Subcutaneous tissue infused with 30cc 0.25% Marcaine.  Subcutaneous closure was performed with remaining 0 Monocryl suture.  Skin closed using 4-0 Vicryl on a Keith needle.  Steri strips applied, followed by pressure dressing.  All instrument, needle and lap counts were correct x 2.  Patient was awake and taken to PACU stable.  Infant to Newborn Nursery, stable.   Augustin Coupe, [place] for [employer] farm manager)

## 2019-11-16 NOTE — Progress Notes (Signed)
Came to room to evaluate patient due to ongoing pain and contractions. On recheck of cervix patient has progressed from 2/50/-2 to 3/60/-2.  Given ongoing contractions and cervical change from 1.5>3 cm since arrival patient was recommended for urgent cesarean now due to preterm labor and history of classical cesarean. Patient is in agreement with this plan. She has already received betamethasone last month.   The risks of cesarean section discussed with the patient included but were not limited to: bleeding which may require transfusion or reoperation; infection which may require antibiotics; injury to bowel, bladder, ureters or other surrounding organs; injury to the fetus; need for additional procedures including hysterectomy in the event of a life-threatening hemorrhage; placental abnormalities with subsequent pregnancies, incisional problems, thromboembolic phenomenon and other postoperative/anesthesia complications. The patient concurred with the proposed plan, giving informed written consent for the procedure. Patient has been NPO since last night she will remain NPO for procedure. Anesthesia and OR aware. Preoperative prophylactic antibiotics and SCDs ordered on call to the OR. To OR when ready.

## 2019-11-16 NOTE — Discharge Summary (Addendum)
Postpartum Discharge Summary  Date of Service updated 11/19/2019     Patient Name: Stacy Christian DOB: 17-Sep-1991 MRN: [id number]  Date of admission: 11/15/2019 Delivering Provider: Donnamae Christian   Date of discharge: 11/19/2019  Admitting diagnosis: Threatened preterm labor, third trimester [O47.03] History of cesarean section [Z98.891] Intrauterine pregnancy: [redacted]w[redacted]d    Secondary diagnosis:  Active Problems:   History of premature delivery, currently pregnant   History of classical cesarean section   Supervision of high risk pregnancy, antepartum   Gestational hypertension   Threatened preterm labor, third trimester   History of cesarean section  Additional problems: None     Discharge diagnosis: Preterm Pregnancy Delivered and Gestational Hypertension                                                                                                Post partum procedures:None  Augmentation: n/a  Complications: None  Hospital course:  Onset of Labor With Unplanned C/S  28y.o. yo [id number] admitted in Latent Labor on 11/15/2019. Patient had a labor course significant for: arrived at 1.5cm in MAU and progressed to 2cm, after which she was admitted for monitoring as she was not laborous appearing and only infrequent contractions. Several hours later complained of ongoing pain and contractions and was 3cm on recheck. Membrane Rupture Time/Date: 1:57 AM ,11/16/2019   The patient went for cesarean section due to preterm labor with history of prior cesarean x2, first of which was a classical, and delivered a Viable infant,11/16/2019  Details of operation can be found in separate operative note. Patient had an uncomplicated postpartum course.  She is ambulating,tolerating a regular diet, passing flatus, and urinating well.  Patient is discharged home in stable condition 11/19/19. Delivery time: 1:58 AM    Magnesium Sulfate received: No BMZ received:  Yes Rhophylac:N/A MMR:N/A Transfusion:No  Physical exam  Vitals:   11/18/19 0920 11/18/19 1430 11/18/19 2131 11/19/19 0530  BP: (!) 147/80 131/86 (!) 131/93 137/85  Pulse: 62 69 74 62  Resp:  _0 Temp:   98 F (36.7 C) 98.3 F (36.8 C)  TempSrc:   Oral Oral  SpO2:   100%   Weight:      Height:       General: alert, cooperative and no distress Lochia: appropriate Uterine Fundus: firm Incision: Healing well with no significant drainage, No significant erythema, Dressing is clean, dry, and intact DVT Evaluation: No evidence of DVT seen on physical exam. Negative Homan's sign. No cords or calf tenderness. No significant calf/ankle edema. Labs: Lab Results  Component Value Date   WBC 14.3 (H) 11/16/2019   HGB 10.4 (L) 11/16/2019   HCT 30.4 (L) 11/16/2019   MCV 85.9 11/16/2019   PLT 196 11/16/2019   CMP Latest Ref Rng & Units 11/15/2019  Glucose 70 - 99 mg/dL 76  BUN 6 - 20 mg/dL <5(L)  Creatinine 0.44 - 1.00 mg/dL 0.49  Sodium 135 - 145 mmol/L 139  Potassium 3.5 - 5.1 mmol/L 3.6  Chloride 98 - 111 mmol/L 104  CO2 22 - 32 mmol/L  25  Calcium 8.9 - 10.3 mg/dL 8.2(L)  Total Protein 6.5 - 8.1 g/dL 6.2(L)  Total Bilirubin 0.3 - 1.2 mg/dL 0.4  Alkaline Phos 38 - 126 U/L 110  AST 15 - 41 U/L 13(L)  ALT 0 - 44 U/L 9    Discharge instruction: per After Visit Summary and "Baby and Me Booklet".  After visit meds:  Allergies as of 11/19/2019   No Known Allergies     Medication List    TAKE these medications   aspirin EC 81 MG tablet Take 1 tablet (81 mg total) by mouth daily.   ferrous sulfate 325 (65 FE) MG tablet Take 1 tablet (325 mg total) by mouth every other day.   HYDROcodone-acetaminophen 5-325 MG tablet Commonly known as: NORCO/VICODIN Take 1-2 tablets by mouth every 4 (four) hours as needed for moderate pain.   ibuprofen 800 MG tablet Commonly known as: ADVIL Take 1 tablet (800 mg total) by mouth every 8 (eight) hours.   NIFEdipine 60 MG 24 hr  tablet Commonly known as: ADALAT CC Take 1 tablet (60 mg total) by mouth daily.   Prenatal Vitamins 28-0.8 MG Tabs Take 1 tablet by mouth daily.   senna-docusate 8.6-50 MG tablet Commonly known as: Senokot-S Take 2 tablets by mouth daily. Start taking on: November 20, 2019       Diet: routine diet  Activity: Advance as tolerated. Pelvic rest for 6 weeks.   Outpatient follow up:6 weeks Follow up Appt: Future Appointments  Date Time Provider [place]  11/21/2019 12:30 PM [place] [facility] WH-MFCUS MFC-US  11/21/2019 12:40 PM [place] MFC-US  12/01/2019 10:00 AM [place] WOC  12/25/2019  3:35 PM Stacy Jude, MD WOC-WOCA WOC   Follow up Visit:   Please schedule this patient for Postpartum visit in: 6 weeks with the following provider: Any provider For C/S patients schedule nurse incision check in weeks 2 weeks: yes High risk pregnancy complicated by: gHTN, hx of classical cesarean Delivery mode:  CS Anticipated Birth Control:  Depo PP Procedures needed: BP check, incision check  Schedule Integrated BH visit: no   Newborn Data: Live born female  Birth Weight:  2195g APGAR: 8, 8  Newborn Delivery   Birth date/time: 11/16/2019 01:58:00 Delivery type: C-Section, Low Transverse Trial of labor: No C-section categorization: Repeat      Baby Feeding: Breast Disposition:home   11/19/2019 Stacy Leitz, MD  OB FELLOW DISCHARGE ATTESTATION  I have seen and examined this patient and agree with above documentation in the resident's note.   Phill Myron, D.O. OB Fellow  11/19/2019, 11:24 AM

## 2019-11-17 ENCOUNTER — Encounter (HOSPITAL_COMMUNITY): Payer: Self-pay | Admitting: Family Medicine

## 2019-11-17 MED ORDER — FERROUS SULFATE 325 (65 FE) MG PO TABS
325.0000 mg | ORAL_TABLET | ORAL | Status: DC
  Administered 2019-11-17 – 2019-11-19 (×2): 325 mg via ORAL
  Filled 2019-11-17 (×2): qty 1

## 2019-11-17 MED ORDER — MEDROXYPROGESTERONE ACETATE 150 MG/ML IM SUSP
150.0000 mg | Freq: Once | INTRAMUSCULAR | Status: AC
  Administered 2019-11-17: 150 mg via INTRAMUSCULAR
  Filled 2019-11-17: qty 1

## 2019-11-17 MED ORDER — SUCCINYLCHOLINE CHLORIDE 200 MG/10ML IV SOSY
PREFILLED_SYRINGE | INTRAVENOUS | Status: AC
  Filled 2019-11-17: qty 10

## 2019-11-17 MED ORDER — NIFEDIPINE ER OSMOTIC RELEASE 30 MG PO TB24
30.0000 mg | ORAL_TABLET | Freq: Every day | ORAL | Status: DC
  Administered 2019-11-17: 30 mg via ORAL
  Filled 2019-11-17: qty 1

## 2019-11-17 NOTE — Progress Notes (Addendum)
POSTPARTUM PROGRESS NOTE  Subjective: Stacy Christian is a 28 y.o. [id number] s/p rLTCS at [redacted]w[redacted]d.  She reports she doing well. No acute events overnight. She denies any problems with ambulating, voiding or po intake. Denies nausea or vomiting. She has  passed flatus. Pain is well controlled.  Lochia is appropriate.  Objective: Blood pressure 130/89, pulse 66, temperature 98.8 F (37.1 C), temperature source Oral, resp. rate 18, height 5\' 6"  (1.676 m), weight 90 kg, last menstrual period 03/10/2019, SpO2 100 %, unknown if currently breastfeeding.  Physical Exam:  General: alert, cooperative and no distress Chest: no respiratory distress Abdomen: soft, tenderness at surgical incision, dressing dry and intact with mod amount dry bloody drainage Uterine Fundus: firm, appropriately tender Extremities: No calf swelling or tenderness  no edema  Recent Labs    11/15/19 1742 11/16/19 0851  HGB 10.8* 10.4*  HCT 31.6* 30.4*    Assessment/Plan: Stacy Christian is a 28 y.o. [id number] s/p rLTCS at [redacted]w[redacted]d.  Routine Postpartum Care: Doing well, pain well-controlled.  -- Continue routine care, lactation support  -- Contraception: Depo -- Feeding: breast  Dispo: Plan for discharge 24-48hrs.  [name] MD [place] for [facility]

## 2019-11-17 NOTE — Lactation Note (Signed)
This note was copied from a baby's chart. Lactation Consultation Note  Patient Name: Stacy Christian [id number] Date: 11/17/2019 Reason for consult: Follow-up assessment;Late-preterm 34-36.6wks;Infant < 6lbs  P2 mother whose infant is now 81 hours old.  This is a LPTI at 35+6 weeks weighing <5 lbs.  Mother breast fed her first child (almost 28 years old) for 4 months.   Baby was asleep in the bassinet when I arrived.  Mother had no questions/concerns related to feeding.  She has been primarily bottle feeding due to his age and needing supplementation, however, she has also been latching.  Mother is familiar with hand expression and had no questions about pumping.  Reminded her to increase his supplementation volume amounts to 20-30 mls at 48 hours which will be at 0200 this upcoming morning.  Encouraged her to continue feeding every three hours and to pump immediately after feeding.  Mother verbalized understanding and plans to continue her routine.   Mother has a DEBP at home but is not sure if it is in working condition.  It is only 28 years old so I explained that it probably does work.  Explained the importance of obtaining a DEBP to be sure that mother's milk supply does not decrease.  Presented other options for obtaining a DEBP if she discovers her pump at home does not function properly.  Discussed the gift shop rentals and also the possibility of obtaining a WIC pump.  [facility] referral faxed.  Mother appreciative.    Suggested she call for latch assistance or for any further questions/concerns.  Father present.  RN in room at the end of my visit and updated.   Maternal Data Formula Feeding for Exclusion: Yes Reason for exclusion: Mother's choice to formula and breast feed on admission Has patient been taught Hand Expression?: Yes Does the patient have breastfeeding experience prior to this delivery?: Yes  Feeding Feeding Type: Formula Nipple Type: Slow - flow  LATCH Score                    Interventions    Lactation Tools Discussed/Used WIC Program: Yes   Consult Status Consult Status: Follow-up Date: 11/18/19 Follow-up type: In-patient    Stacy Christian 11/17/2019, 6:38 PM

## 2019-11-17 NOTE — Clinical Social Work Maternal (Signed)
CLINICAL SOCIAL WORK MATERNAL/CHILD NOTE  Patient Details  Name: Stacy Christian MRN: [id number] Date of Birth: 1991-05-23  Date:  11/17/2019  Clinical Social Worker Initiating Note:  Elijio Miles Date/Time: Initiated:  11/17/19/1303     Child's Name:  Stacy Christian   Biological Parents:  Mother, Father(Stacy Christian and Stacy Christian DOB: 02/06/1998)   Need for Interpreter:  None   Reason for Referral:  Current Substance Use/Substance Use During Pregnancy     Address:  [address] [place] [address]    Phone number:  [phone] (home)     Additional phone number:   Household Members/Support Persons (HM/SP):   Household Member/Support Person 1   HM/SP Name Relationship DOB or Age  HM/SP -1 Stacy Christian Daughter 12/07/2017  HM/SP -2        HM/SP -3        HM/SP -4        HM/SP -5        HM/SP -6        HM/SP -7        HM/SP -8          Natural Supports (not living in the home):  Spouse/significant other, Immediate Family   Professional Supports: None   Employment: Unemployed   Type of Work:     Education:  Nurse, adult   Homebound arranged:    Museum/gallery curator Resources:  [employer]   Other Resources:  Physicist, medical  (Provided with [place] information)   Cultural/Religious Considerations Which May Impact Care:    Strengths:  Ability to meet basic needs  , Home prepared for child  , Pediatrician chosen   Psychotropic Medications:         Pediatrician:    [place]  Pediatrician List:   [facility] for [place]      Pediatrician Fax Number:    Risk Factors/Current Problems:      Cognitive State:  Able to Concentrate  , Alert  , Linear Thinking     Mood/Affect:  Bright  , Calm  , Comfortable  , Interested  , Happy  , Tearful     CSW Assessment:  CSW received consult for THC use during pregnancy.   CSW met with MOB to offer support and complete assessment.    MOB feeding infant with FOB standing at bedside, when CSW entered the room. CSW introduced self and received verbal permission to complete assessment with FOB present. MOB and FOB both very pleasant and engaged throughout assessment. MOB and FOB both appropriate and attentive to infant during visit. MOB reported she currently lives alone with her 28-year-old daughter. MOB confirmed she receives food stamps and was provided with information for [facility] in the event she chooses to apply. CSW inquired about MOB's mental health history and MOB denied any previous mental health history and denied any PMADs with previous pregnancy. CSW provided education regarding the baby blues period vs. perinatal mood disorders, discussed treatment and gave resources for mental health follow up if concerns arise.  CSW recommends self-evaluation during the postpartum time period using the New Mom Checklist from Postpartum Progress and encouraged MOB to contact a medical professional if symptoms are noted at any time. MOB tearful periodically throughout assessment but did not appear to be displaying any acute mental health symptoms. MOB denied any current SI or  HI and reported feeling well supported by FOB and close family. FOB observed to be a good support for MOB and able to make her laugh during assessment whenever she was tearful.   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 MOB open and honest about use and stated last use was a week ago. CSW informed MOB of Hospital Drug Policy and explained UDS came back negative but that CDS was still pending and a CPS report would be made, if warranted. CSW offered to explain what process would likely look like if report is made and MOB and FOB were receptive. MOB and FOB both denied any questions or concerns regarding policy.  MOB and FOB reported  needing assistance with some of the items needed for infant once discharged. Per MOB, she has a safe place for infant to sleep once discharged but does not have a car seat that can fit infant as he is smaller than anticipated. CSW informed MOB and FOB of Car seat Program and they expressed interest. CSW to provide MOB with car seat and Baby Bundle prior to discharge. CSW provided review of Sudden Infant Death Syndrome (SIDS) precautions and safe sleeping habits.      CSW Plan/Description:  No Further Intervention Required/No Barriers to Discharge, Sudden Infant Death Syndrome (SIDS) Education, Perinatal Mood and Anxiety Disorder (PMADs) Education, [place], CSW Will Continue to Monitor Umbilical Cord Tissue Drug Screen Results and Make Report if [facility], LCSW 11/17/2019, 2:07 PM

## 2019-11-18 LAB — SURGICAL PATHOLOGY

## 2019-11-18 MED ORDER — NIFEDIPINE ER OSMOTIC RELEASE 30 MG PO TB24
60.0000 mg | ORAL_TABLET | Freq: Every day | ORAL | Status: DC
  Administered 2019-11-18 – 2019-11-19 (×2): 60 mg via ORAL
  Filled 2019-11-18 (×2): qty 2

## 2019-11-18 NOTE — Lactation Note (Signed)
This note was copied from a baby's chart. Lactation Consultation Note  Patient Name: Stacy Christian [id number] Date: 11/18/2019 Reason for consult: Follow-up assessment;Late-preterm 34-36.6wks;Infant < 6lbs;Infant weight loss Baby is 62 hours old  As LC entered the room mom feeding baby formula with yellow nipple ,  Baby fully dressed in a T- shirt and ones. Per mom baby has been sluggish with 'this feeding. LC recommended prior to feeding - check and change diaper if needed and undress so the baby will be more awake.  Baby accommodates the yellow nipple and tolerates the flow of the milk.  Per mom has pumped x 4 in the last 24 hours and has been increasing with volume. [place] praised mom for her efforts pumping and encouraged to increasing Pumping.  Per mom has spoke to [facility] and was advised to call back in the am to set up a time for picking up her DEBP and neosure.  LC mentioned to mom [place] would relay the message to the Pedis a prescription is needed for the Neosure formula.  Mom aware she needs to feed the baby at least 30 ml per  Feeding , reviewing the doc flow sheet he seems to increase volume more at night.  LC explained to mom that is  Normal .    Maternal Data    Feeding Feeding Type: Formula Nipple Type: Slow - flow  LATCH Score                   Interventions Interventions: Breast feeding basics reviewed  Lactation Tools Discussed/Used Tools: Pump Breast pump type: Double-Electric Breast Pump   Consult Status Consult Status: Follow-up Date: 11/19/19 Follow-up type: In-patient    [name] 11/18/2019, 2:15 PM

## 2019-11-18 NOTE — Progress Notes (Signed)
POSTPARTUM PROGRESS NOTE  POD #2  Subjective:  Stacy Christian is a 28 y.o. [id number] s/p rLTCS at [redacted]w[redacted]d.  She reports she doing well. No acute events overnight. She denies any problems with ambulating, voiding or po intake. Denies nausea or vomiting. She has passed flatus. Pain is well controlled.  Lochia is appropriate.  Objective: Blood pressure (!) 145/91, pulse 81, temperature 97.9 F (36.6 C), temperature source Oral, resp. rate 17, height 5\' 6"  (1.676 m), weight 90 kg, last menstrual period 03/10/2019, SpO2 100 %, unknown if currently breastfeeding.  Physical Exam:  General: alert, cooperative and no distress Chest: no respiratory distress Heart:regular rate, distal pulses intact Abdomen: soft, nontender,  Uterine Fundus: firm, appropriately tender DVT Evaluation: No calf swelling or tenderness Extremities: no LE edema Skin: warm, dry; incision clean/dry/intact w/ honeycomb dressing in place  Recent Labs    11/15/19 1742 11/16/19 0851  HGB 10.8* 10.4*  HCT 31.6* 30.4*    Assessment/Plan: Stacy Christian is a 28 y.o. [id number] s/p rLTCS at [redacted]w[redacted]d for preterm labor in setting of 2 prior CS including a classical.  POD#2 - Doing welll; pain well controlled. H/H appropriate  Routine postpartum care  OOB, ambulated  Lovenox for VTE prophylaxis gHTN: started on Nifedipine 30 XL yesterday, ongoing mild range BP's, increase to 60mg  today Anemia: asymptomatic  Started on po ferrous sulfate SW: following for hx of THC, no barriers to discharge per last note Contraception: Depo Feeding: Breast Circ: outpatient  Dispo: Plan for discharge POD#3.   LOS: 2 days   Augustin Coupe, MD/[facility] OB Fellow  11/18/2019, 8:44 AM

## 2019-11-19 ENCOUNTER — Other Ambulatory Visit: Payer: Medicaid Other

## 2019-11-19 ENCOUNTER — Encounter: Payer: Medicaid Other | Admitting: Obstetrics and Gynecology

## 2019-11-19 MED ORDER — IBUPROFEN 800 MG PO TABS: 800.0000 mg | ORAL_TABLET | Freq: Three times a day (TID) | ORAL | 0 refills | Status: DC

## 2019-11-19 MED ORDER — HYDROCODONE-ACETAMINOPHEN 5-325 MG PO TABS: 1.0000 | ORAL_TABLET | ORAL | 0 refills | Status: DC | PRN

## 2019-11-19 MED ORDER — SENNOSIDES-DOCUSATE SODIUM 8.6-50 MG PO TABS: 2.0000 | ORAL_TABLET | ORAL | 0 refills | Status: DC

## 2019-11-19 MED ORDER — NIFEDIPINE ER 60 MG PO TB24: 60.0000 mg | ORAL_TABLET | Freq: Every day | ORAL | 1 refills | Status: DC

## 2019-11-19 MED ORDER — FERROUS SULFATE 325 (65 FE) MG PO TABS: 325.0000 mg | ORAL_TABLET | ORAL | 3 refills | Status: DC

## 2019-11-19 MED FILL — SENEXON-S 8.6-50 MG TABS: 8.6-50 | 30 days supply | Qty: 60 | Fill #0

## 2019-11-19 MED FILL — HYDROCODON-APAP 5-325: 5-325 | 3 days supply | Qty: 30 | Fill #0

## 2019-11-19 MED FILL — NIFEdipine ER 60 MG TB24: 60 | 30 days supply | Qty: 30 | Fill #0

## 2019-11-19 MED FILL — FERROUS SULFATE 325 MG TAB: 325 (65 FE) | 60 days supply | Qty: 30 | Fill #0

## 2019-11-19 MED FILL — IBUPROFEN 800 MG TAB: 800 | 10 days supply | Qty: 30 | Fill #0

## 2019-11-19 NOTE — Lactation Note (Signed)
This note was copied from a baby's chart. Lactation Consultation Note  Patient Name: Stacy Christian [id number]'O Date: 11/19/2019 Reason for consult: Follow-up assessment;Late-preterm 34-36.6wks;Infant < 6lbs Baby is 78 hours old/4% weight loss.  Mom is latching baby and then post pumping and bottle feeding EBM and or neosure 22 calorie.  Milk is coming to volume and she is obtaining 30+ mls of transitional milk.  Breasts are comfortable.  Mom plans on calling [facility] this morning for a pump.  Stressed importance of pumping every 3 hours to maintain a good milk supply.  Reviewed outpatient services and encouraged to call for assist prn.  Maternal Data    Feeding    LATCH Score                   Interventions    Lactation Tools Discussed/Used     Consult Status Consult Status: Complete Follow-up type: Call as needed    [place] 11/19/2019, 8:08 AM

## 2019-11-21 ENCOUNTER — Encounter (HOSPITAL_COMMUNITY): Payer: Self-pay

## 2019-11-21 ENCOUNTER — Ambulatory Visit (HOSPITAL_COMMUNITY): Payer: Medicaid Other

## 2019-11-22 ENCOUNTER — Other Ambulatory Visit (HOSPITAL_COMMUNITY)
Admission: RE | Admit: 2019-11-22 | Discharge: 2019-11-22 | Disposition: A | Discharge status: Home / Self Care | Payer: Medicaid Other | Source: Ambulatory Visit

## 2019-11-24 ENCOUNTER — Inpatient Hospital Stay (HOSPITAL_COMMUNITY): Admit: 2019-11-24 | Payer: Medicaid Other | Admitting: Family Medicine

## 2019-11-26 ENCOUNTER — Other Ambulatory Visit: Payer: Medicaid Other

## 2019-11-26 ENCOUNTER — Encounter: Payer: Medicaid Other | Admitting: Obstetrics and Gynecology

## 2019-12-01 ENCOUNTER — Ambulatory Visit: Payer: Medicaid Other

## 2019-12-03 ENCOUNTER — Other Ambulatory Visit: Payer: Medicaid Other

## 2019-12-25 ENCOUNTER — Ambulatory Visit: Payer: Medicaid Other | Admitting: Family Medicine

## 2019-12-25 ENCOUNTER — Encounter: Payer: Self-pay | Admitting: Family Medicine

## 2019-12-25 NOTE — Progress Notes (Signed)
Patient did not keep appointment today. She will be called to reschedule.  

## 2020-03-18 ENCOUNTER — Encounter: Payer: Self-pay | Admitting: *Deleted

## 2020-05-29 IMAGING — US US MFM OB FOLLOW-UP
1 series · 14 of 28 positions shown · non-contrast
Comparison: none

[Series 1: us mfm ob follow-up · 14 of 47 slices shown]
[im 2/47]
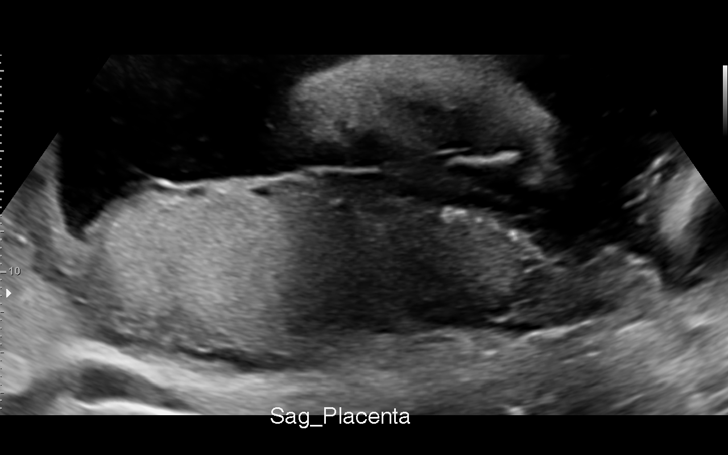
[im 6/47]
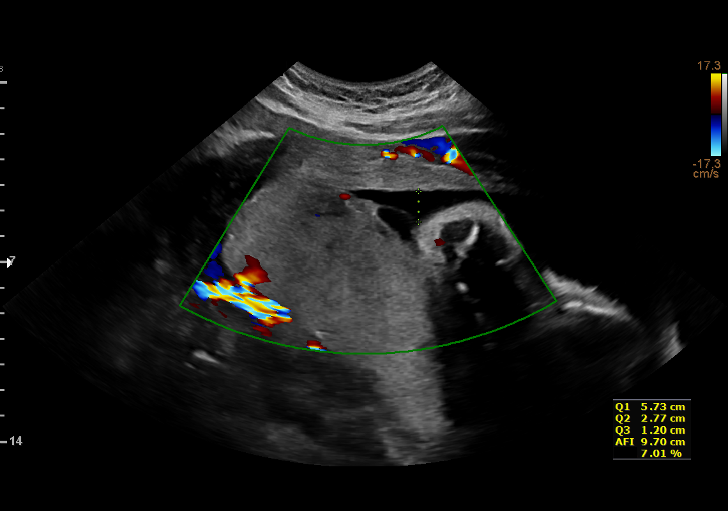
[im 9/47]
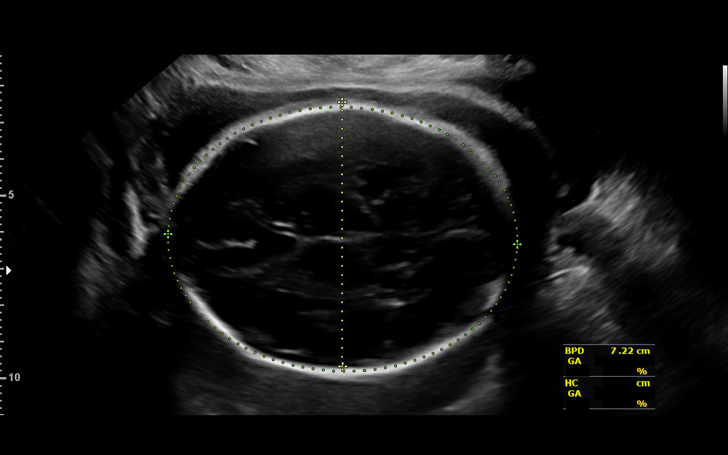
[im 12/47]
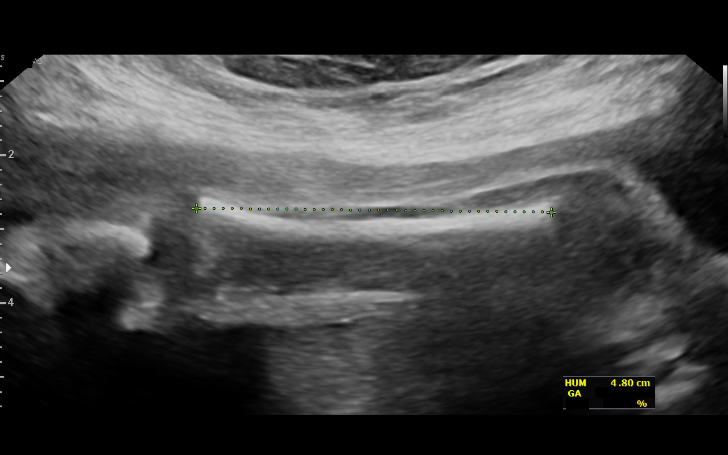
[im 16/47]
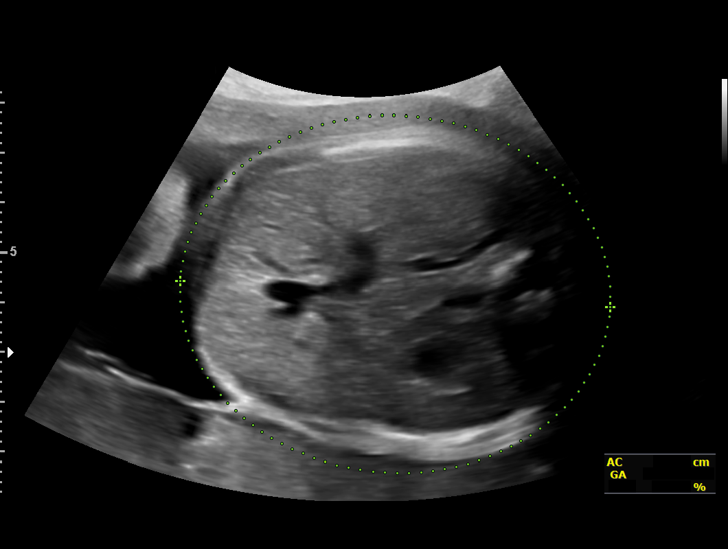
[im 19/47]
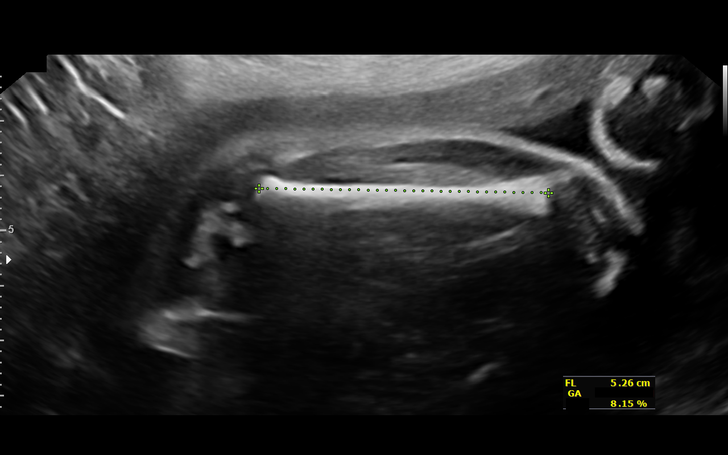
[im 23/47]
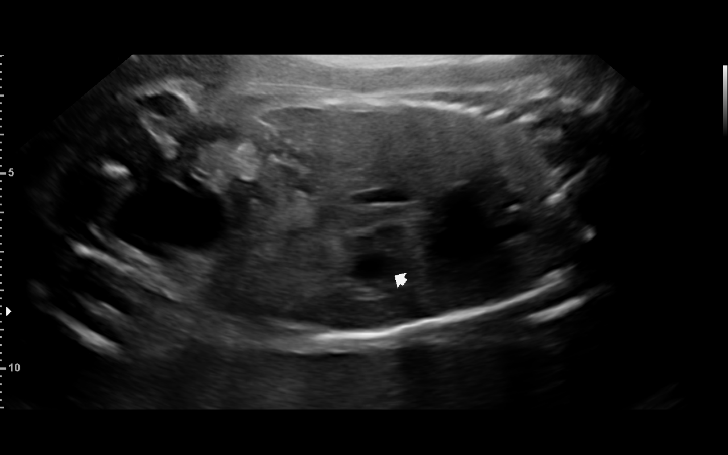
[im 26/47]
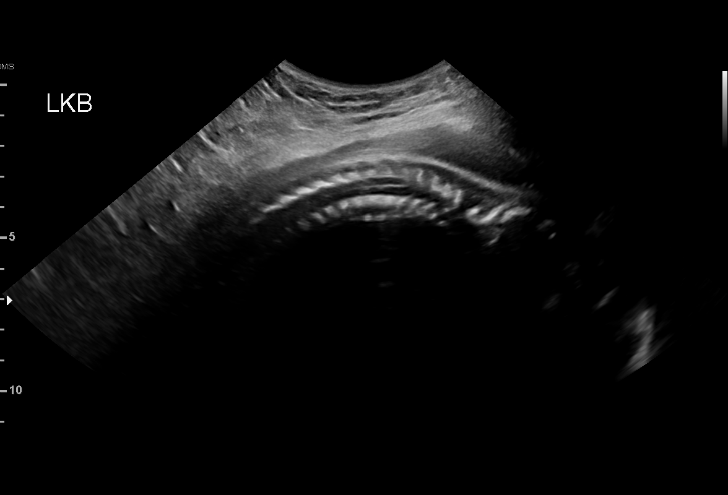
[im 29/47]
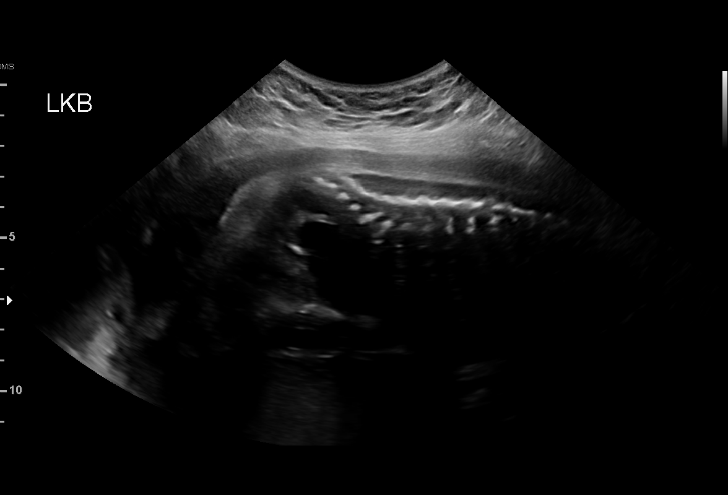
[im 33/47]
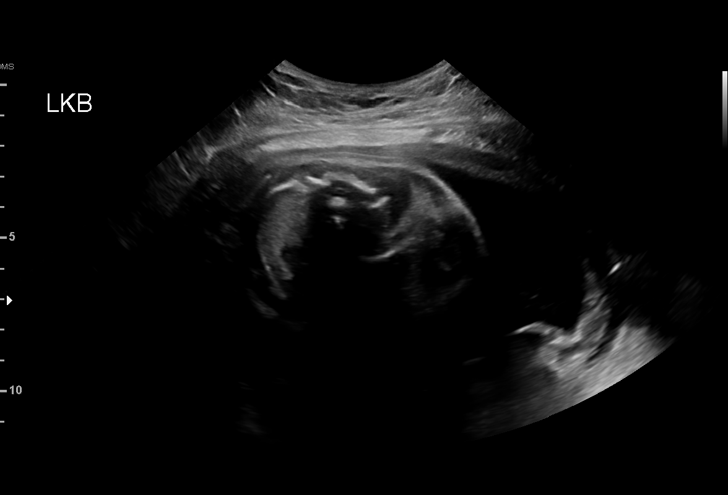
[im 36/47]
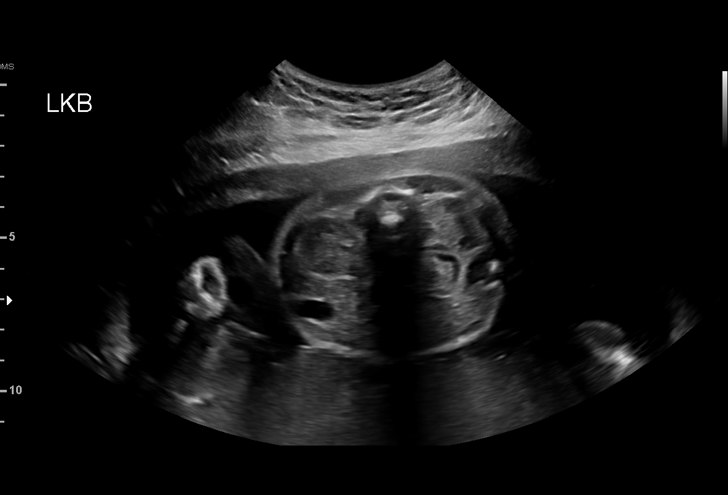
[im 40/47]
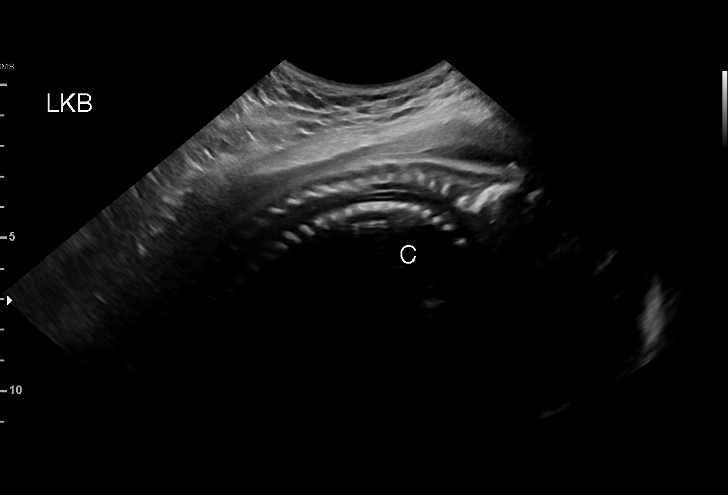
[im 43/47]
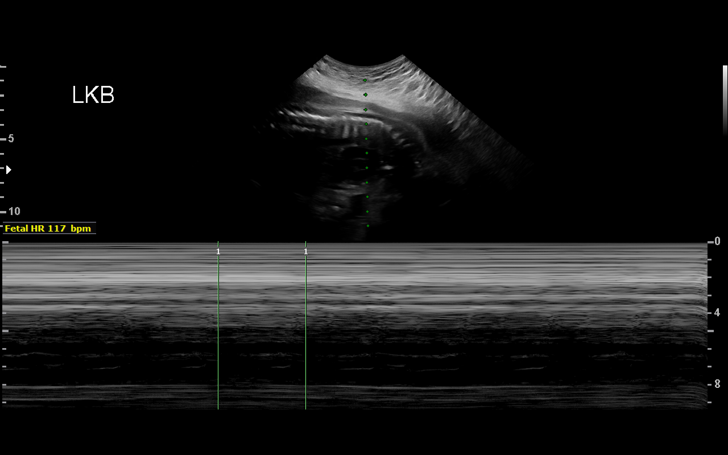
[im 47/47]
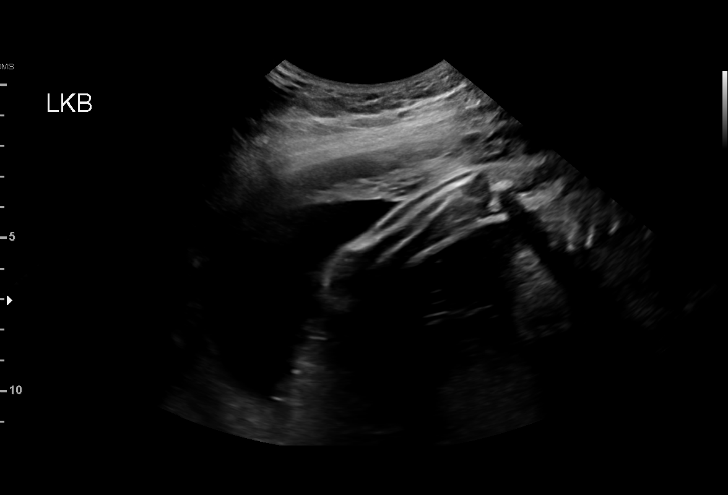

[14 of 28 positions shown; findings below may reference images not displayed]

----------------------------------------------------------------------

 ----------------------------------------------------------------------
Indications

  Poor obstetric history: Previous
  preeclampsia / eclampsia/gestational HTN
  (ASA)
  Poor obstetric history: Previous neonatal
  death
  Poor obstetric history: Previous preterm
  delivery, antepartum x2 (24 & 27 weeks)
  Poor obstetrical history (placental abruption)
  Previous cesarean delivery, antepartum
  Poor obstetric history: Previous fetal growth
  restriction (FGR)
  29 weeks gestation of pregnancy
 ----------------------------------------------------------------------
Vital Signs

                                                Height:        5'6"
Fetal Evaluation

 Num Of Fetuses:         1
 Fetal Heart Rate(bpm):  128
 Cardiac Activity:       Observed
 Presentation:           Cephalic
 Placenta:               Posterior
 P. Cord Insertion:      Previously Visualized

 Amniotic Fluid
 AFI FV:      Within normal limits

 AFI Sum(cm)     %Tile       Largest Pocket(cm)
 14.92           52
 RUQ(cm)       RLQ(cm)       LUQ(cm)        LLQ(cm)

Biometry

 BPD:        73  mm     G. Age:  29w 2d         38  %    CI:        76.62   %    70 - 86
                                                         FL/HC:      20.0   %    19.6 -
 HC:      264.2  mm     G. Age:  28w 5d          8  %    HC/AC:      1.09        0.99 -
 AC:      242.9  mm     G. Age:  28w 4d         23  %    FL/BPD:     72.3   %    71 - 87
 FL:       52.8  mm     G. Age:  28w 1d          9  %    FL/AC:      21.7   %    20 - 24
 HUM:      48.2  mm     G. Age:  28w 2d         27  %

 Est. FW:    1991  gm    2 lb 11 oz      14  %
OB History

 Gravidity:    4         Term:   0        Prem:   2        SAB:   1
 Living:       1
Gestational Age

 LMP:           29w 2d        Date:  03/10/19                 EDD:   12/15/19
 U/S Today:     28w 5d                                        EDD:   12/19/19
 Best:          29w 2d     Det. By:  LMP  (03/10/19)          EDD:   12/15/19
Anatomy

 Cranium:               Appears normal         Aortic Arch:            Previously seen
 Cavum:                 Appears normal         Ductal Arch:            Previously seen
 Ventricles:            Appears normal         Diaphragm:              Appears normal
 Choroid Plexus:        Previously seen        Stomach:                Appears normal, left
                                                                       sided
 Cerebellum:            Previously seen        Abdomen:                Appears normal
 Posterior Fossa:       Previously seen        Abdominal Wall:         Previously seen
 Nuchal Fold:           Not applicable (>20    Cord Vessels:           Previously seen
                        wks GA)
 Face:                  Orbits and profile     Kidneys:                Appear normal
                        previously seen
 Lips:                  Previously seen        Bladder:                Appears normal
 Thoracic:              Previously seen        Spine:                  Previously seen
 Heart:                 Previously seen        Upper Extremities:      Previously seen
 RVOT:                  Previously seen        Lower Extremities:      Previously seen
 LVOT:                  Previously seen
Impression

 Normal interval growth.
 Prior history of fetal growth restriction and preterm delivery
 Total weight trending down, good fetal movement and
 amniotic fluid
 Blood pressure in clinic was [REDACTED]'s and asymptomatic
 Prior classical cesarean delivery.
Recommendations

 Follow up growth in 4 weeks.
 Planned delivery at 37 weeks given prior classical cesarean
 delivery

## 2022-02-09 DIAGNOSIS — Z113 Encounter for screening for infections with a predominantly sexual mode of transmission: Secondary | ICD-10-CM | POA: Diagnosis not present

## 2022-02-09 DIAGNOSIS — Z114 Encounter for screening for human immunodeficiency virus [HIV]: Secondary | ICD-10-CM | POA: Diagnosis not present

## 2022-02-09 DIAGNOSIS — N760 Acute vaginitis: Secondary | ICD-10-CM | POA: Diagnosis not present

## 2022-04-18 DIAGNOSIS — H5213 Myopia, bilateral: Secondary | ICD-10-CM | POA: Diagnosis not present

## 2022-04-26 DIAGNOSIS — Z113 Encounter for screening for infections with a predominantly sexual mode of transmission: Secondary | ICD-10-CM | POA: Diagnosis not present

## 2022-04-26 DIAGNOSIS — N760 Acute vaginitis: Secondary | ICD-10-CM | POA: Diagnosis not present

## 2022-04-26 DIAGNOSIS — A5901 Trichomonal vulvovaginitis: Secondary | ICD-10-CM | POA: Diagnosis not present

## 2022-04-26 DIAGNOSIS — Z114 Encounter for screening for human immunodeficiency virus [HIV]: Secondary | ICD-10-CM | POA: Diagnosis not present

## 2022-05-26 DIAGNOSIS — Z8619 Personal history of other infectious and parasitic diseases: Secondary | ICD-10-CM | POA: Diagnosis not present

## 2022-05-26 DIAGNOSIS — Z113 Encounter for screening for infections with a predominantly sexual mode of transmission: Secondary | ICD-10-CM | POA: Diagnosis not present

## 2022-12-14 ENCOUNTER — Emergency Department (HOSPITAL_COMMUNITY)
Admission: EM | Admit: 2022-12-14 | Discharge: 2022-12-14 | Disposition: A | Discharge status: Home / Self Care | Payer: 59 | Attending: Emergency Medicine | Admitting: Emergency Medicine

## 2022-12-14 DIAGNOSIS — K029 Dental caries, unspecified: Secondary | ICD-10-CM | POA: Diagnosis not present

## 2022-12-14 DIAGNOSIS — K047 Periapical abscess without sinus: Secondary | ICD-10-CM

## 2022-12-14 DIAGNOSIS — Z7982 Long term (current) use of aspirin: Secondary | ICD-10-CM | POA: Insufficient documentation

## 2022-12-14 DIAGNOSIS — K0889 Other specified disorders of teeth and supporting structures: Secondary | ICD-10-CM

## 2022-12-14 MED ORDER — OXYCODONE-ACETAMINOPHEN 5-325 MG PO TABS
1.0000 | ORAL_TABLET | Freq: Once | ORAL | Status: AC
  Administered 2022-12-14: 1 via ORAL
  Filled 2022-12-14: qty 1

## 2022-12-14 MED ORDER — AMOXICILLIN-POT CLAVULANATE 875-125 MG PO TABS: 1.0000 | ORAL_TABLET | Freq: Two times a day (BID) | ORAL | 0 refills | Status: DC

## 2022-12-14 MED ORDER — HYDROCODONE-ACETAMINOPHEN 5-325 MG PO TABS: 1.0000 | ORAL_TABLET | Freq: Four times a day (QID) | ORAL | 0 refills | Status: DC | PRN

## 2022-12-14 NOTE — ED Provider Notes (Signed)
[place] DEPT Provider Note   CSN: [id number] Arrival date & time: 12/14/22  1222     History  Chief Complaint  Patient presents with   Dental Pain    Stacy Christian is a 32 y.o. female, who presents to the ED 2/2 to left upper tooth pain for the last 2 days. C/o of tooth fracture, has not seen dentist in a year. No swelling or redness to face. No fevers. Has taken excedrin and done salt water rinses w/o relief.   LMP 11/24/22    Home Medications Prior to Admission medications   Medication Sig Start Date End Date Taking? Authorizing Provider  amoxicillin-clavulanate (AUGMENTIN) 875-125 MG tablet Take 1 tablet by mouth every 12 (twelve) hours. 12/14/22  Yes Marna Weniger L, PA  HYDROcodone-acetaminophen (NORCO) 5-325 MG tablet Take 1 tablet by mouth every 6 (six) hours as needed for moderate pain. 12/14/22  Yes Innocence Schlotzhauer L, PA  aspirin EC 81 MG tablet Take 1 tablet (81 mg total) by mouth daily. 08/06/19   Woodroe Mode, MD  ferrous sulfate 325 (65 FE) MG tablet Take 1 tablet (325 mg total) by mouth every other day. 11/19/19   [name], MD  ibuprofen (ADVIL) 800 MG tablet Take 1 tablet (800 mg total) by mouth every 8 (eight) hours. 11/19/19   [name], MD  NIFEdipine (ADALAT CC) 60 MG 24 hr tablet Take 1 tablet (60 mg total) by mouth daily. 11/19/19   [name], MD  Prenatal Vit-Fe Fumarate-FA (PRENATAL VITAMINS) 28-0.8 MG TABS Take 1 tablet by mouth daily. 09/03/17   Aletha Halim, MD  senna-docusate (SENOKOT-S) 8.6-50 MG tablet Take 2 tablets by mouth daily. 11/20/19   [name], MD      Allergies    Patient has no known allergies.    Review of Systems   Review of Systems  Constitutional:  Negative for fever.  HENT:  Positive for dental problem. Negative for facial swelling.     Physical Exam Updated Vital Signs BP (!) 175/135 (BP Location: Right Arm)   Pulse 79   Temp 98.4 F (36.9 C) (Oral)   Resp 18   SpO2 100%   Physical Exam Vitals and nursing note reviewed.  Constitutional:      General: She is not in acute distress.    Appearance: She is well-developed. She is not toxic-appearing.  HENT:     Head: Normocephalic and atraumatic.     Right Ear: Tympanic membrane normal. Tympanic membrane is not perforated, erythematous, retracted or bulging.     Left Ear: Tympanic membrane normal. Tympanic membrane is not perforated, erythematous, retracted or bulging.     Nose: Nose normal.     Mouth/Throat:     Mouth: Mucous membranes are moist.     Pharynx: Uvula midline. No oropharyngeal exudate, posterior oropharyngeal erythema or uvula swelling.     Tonsils: No tonsillar abscesses.      Comments: Fractured dental carie, without fluctuance erythema to the gum.  No facial swelling, redness of the face. Eyes:     General:        Right eye: No discharge.        Left eye: No discharge.     Conjunctiva/sclera: Conjunctivae normal.  Pulmonary:     Effort: No respiratory distress.  Musculoskeletal:     Cervical back: Normal range of motion and neck supple.  Lymphadenopathy:     Cervical: No cervical adenopathy.  Neurological:     Mental  Status: She is alert.     Comments: Clear speech.   Psychiatric:        Behavior: Behavior normal.        Thought Content: Thought content normal.     ED Results / Procedures / Treatments   Labs (all labs ordered are listed, but only abnormal results are displayed) Labs Reviewed - No data to display  EKG None  Radiology No results found.  Procedures Procedures    Medications Ordered in ED Medications  oxyCODONE-acetaminophen (PERCOCET/ROXICET) 5-325 MG per tablet 1 tablet (1 tablet Oral Given 12/14/22 1234)    ED Course/ Medical Decision Making/ A&P                           Medical Decision Making With patient, she has dental pain has been on for the last couple days, has not been to the dentist in about a year, reports fractured tooth, is in severe  pain we will give oxycodone, sent Norco to the pharmacy after reviewing narcotic record.  Will start on Augmentin secondary to tooth fracture, concern for possible brewing infection, she has no overt signs of an abscess such as fluctuance, erythema of the gums.  She also has no cellulitis of her face.  I provided her with information for dentist, and follow-up encouraged her to follow-up shortly.  We discussed pain control for dental pain, and she voiced understanding.  Return precautions were emphasized.  Risk Prescription drug management.   Final Clinical Impression(s) / ED Diagnoses Final diagnoses:  Pain, dental  Dental infection    Rx / DC Orders ED Discharge Orders          Ordered    HYDROcodone-acetaminophen (NORCO) 5-325 MG tablet  Every 6 hours PRN        12/14/22 1236    amoxicillin-clavulanate (AUGMENTIN) 875-125 MG tablet  Every 12 hours        12/14/22 1236              Toney Difatta, [place], PA 12/14/22 1239    Valarie Merino, MD 12/15/22 [id number]

## 2022-12-14 NOTE — ED Triage Notes (Signed)
Left top dental pain admits that tooth is broken and needs extracted. Onset of pain "a day or so"

## 2022-12-14 NOTE — Discharge Instructions (Addendum)
Please follow-up with your dentist, dentist is provided please call and make an appointment.  Take the antibiotics as prescribed, and use pain medication as needed, for severe pain.  If you have mild to moderate pain please use ibuprofen 800 mg 3 times a day as needed.  Return to the ED secondary to facial swelling, warmth, redness to the face, or severe pain.

## 2023-06-22 ENCOUNTER — Other Ambulatory Visit: Payer: Self-pay

## 2023-06-22 ENCOUNTER — Emergency Department (HOSPITAL_COMMUNITY)
Admission: EM | Admit: 2023-06-22 | Discharge: 2023-06-22 | Disposition: A | Discharge status: Home / Self Care | Payer: 59 | Attending: Emergency Medicine | Admitting: Emergency Medicine

## 2023-06-22 ENCOUNTER — Emergency Department (HOSPITAL_COMMUNITY): Payer: 59

## 2023-06-22 ENCOUNTER — Encounter (HOSPITAL_COMMUNITY): Payer: Self-pay

## 2023-06-22 DIAGNOSIS — O131 Gestational [pregnancy-induced] hypertension without significant proteinuria, first trimester: Secondary | ICD-10-CM | POA: Insufficient documentation

## 2023-06-22 DIAGNOSIS — R110 Nausea: Secondary | ICD-10-CM | POA: Insufficient documentation

## 2023-06-22 DIAGNOSIS — O26891 Other specified pregnancy related conditions, first trimester: Secondary | ICD-10-CM | POA: Insufficient documentation

## 2023-06-22 DIAGNOSIS — O24410 Gestational diabetes mellitus in pregnancy, diet controlled: Secondary | ICD-10-CM | POA: Diagnosis not present

## 2023-06-22 DIAGNOSIS — Z3A01 Less than 8 weeks gestation of pregnancy: Secondary | ICD-10-CM | POA: Insufficient documentation

## 2023-06-22 DIAGNOSIS — J45909 Unspecified asthma, uncomplicated: Secondary | ICD-10-CM | POA: Insufficient documentation

## 2023-06-22 DIAGNOSIS — O99511 Diseases of the respiratory system complicating pregnancy, first trimester: Secondary | ICD-10-CM | POA: Insufficient documentation

## 2023-06-22 DIAGNOSIS — R109 Unspecified abdominal pain: Secondary | ICD-10-CM | POA: Insufficient documentation

## 2023-06-22 LAB — COMPREHENSIVE METABOLIC PANEL
ALT: 17 U/L (ref 0–44)
AST: 21 U/L (ref 15–41)
Albumin: 4.3 g/dL (ref 3.5–5.0)
Alkaline Phosphatase: 41 U/L (ref 38–126)
Anion gap: 8 (ref 5–15)
BUN: 9 mg/dL (ref 6–20)
CO2: 24 mmol/L (ref 22–32)
Calcium: 9.3 mg/dL (ref 8.9–10.3)
Chloride: 102 mmol/L (ref 98–111)
Creatinine, Ser: 0.73 mg/dL (ref 0.44–1.00)
GFR, Estimated: >60 mL/min (ref >60)
Glucose, Bld: 93 mg/dL (ref 70–99)
Potassium: 3.5 mmol/L (ref 3.5–5.1)
Sodium: 134 mmol/L — ABNORMAL LOW (ref 135–145)
Total Bilirubin: 0.4 mg/dL (ref 0.3–1.2)
Total Protein: 8.2 g/dL — ABNORMAL HIGH (ref 6.5–8.1)

## 2023-06-22 LAB — CBC WITH DIFFERENTIAL/PLATELET
Abs Immature Granulocytes: 0.02 10*3/uL (ref 0.00–0.07)
Basophils Absolute: 0.1 10*3/uL (ref 0.0–0.1)
Basophils Relative: 1 %
Eosinophils Absolute: 0 10*3/uL (ref 0.0–0.5)
Eosinophils Relative: 0 %
HCT: 41.8 % (ref 36.0–46.0)
Hemoglobin: 14 g/dL (ref 12.0–15.0)
Immature Granulocytes: 0 %
Lymphocytes Relative: 23 %
Lymphs Abs: 1.6 10*3/uL (ref 0.7–4.0)
MCH: 30 pg (ref 26.0–34.0)
MCHC: 33.5 g/dL (ref 30.0–36.0)
MCV: 89.5 fL (ref 80.0–100.0)
Monocytes Absolute: 0.4 10*3/uL (ref 0.1–1.0)
Monocytes Relative: 6 %
Neutro Abs: 5 10*3/uL (ref 1.7–7.7)
Neutrophils Relative %: 70 %
Platelets: 320 10*3/uL (ref 150–400)
RBC: 4.67 MIL/uL (ref 3.87–5.11)
RDW: 12.1 % (ref 11.5–15.5)
WBC: 7.2 10*3/uL (ref 4.0–10.5)
nRBC: 0 % (ref 0.0–0.2)

## 2023-06-22 LAB — URINALYSIS, ROUTINE W REFLEX MICROSCOPIC
Bilirubin Urine: NEGATIVE
Glucose, UA: NEGATIVE mg/dL
Hgb urine dipstick: NEGATIVE
Ketones, ur: NEGATIVE mg/dL
Leukocytes,Ua: NEGATIVE
Nitrite: NEGATIVE
Protein, ur: NEGATIVE mg/dL
Specific Gravity, Urine: 1.021 (ref 1.005–1.030)
pH: 7 (ref 5.0–8.0)

## 2023-06-22 LAB — LIPASE, BLOOD: Lipase: 26 U/L (ref 11–51)

## 2023-06-22 LAB — HCG, QUANTITATIVE, PREGNANCY: hCG, Beta Chain, Quant, S: 15653 m[IU]/mL — ABNORMAL HIGH (ref <5)

## 2023-06-22 MED ORDER — PRENATAL COMPLETE 14-0.4 MG PO TABS: 1.0000 | ORAL_TABLET | Freq: Every day | ORAL | 2 refills | Status: DC

## 2023-06-22 NOTE — ED Notes (Signed)
Triage PA made aware of elevated hcg quant number

## 2023-06-22 NOTE — ED Provider Notes (Signed)
[place] EMERGENCY DEPARTMENT AT [facility] Provider Note  MDM   HPI/ROS:  Stacy Christian is a 32 y.o. [id number] female with a medical history as below who presents for evaluation of abdominal pain.  She reports that her last menstrual period ended on June 8.  She started experiencing some abdominal pain and cramping over the last several days, and has had some nausea but no vomiting.  Denies any diarrhea, fever, chills, or any other infectious type symptoms.  She not had any hematuria, or dysuria.  Physical exam is notable for: - No tenderness to palpation in the abdomen  On my initial evaluation, patient is:  -Vital signs stable. Patient afebrile, hemodynamically stable, and non-toxic appearing. -Additional history obtained from chart review.   This patient's current presentation, including their history and physical exam, is most consistent with pregnancy of unknown location.  Differentials include IUP, ectopic, less likely other causes of acute abdominal pain including gastroenteritis.  Labs obtained in triage process showed positive pregnancy test.  She does report that she has had some minor increasing in urinary frequency and light vaginal bleeding and spotting as well.  She has a history of diabetes that is diet controlled, and a history of hypertension in pregnancy and pre-E with at least one of her pregnancies.  Overall well-appearing, denies any complaints at current time.  She does report that she has followed with [facility] center in the past for her OB/GYN care, and would like to establish with them again.  Will plan for discharge with instructions for prenatal vitamins and close follow-up with OB/GYN pending confirmation of intrauterine pregnancy.  Interpretations, interventions, and the patient's course of care are documented below.    Clinical Course as of 06/22/23 2202  Fri Jun 22, 2023  2157 US OB Transvaginal Confirmation of IUP estimated to be around 6  weeks [BB]    Clinical Course User Index [BB] Fayrene Helper, MD      Disposition:  I discussed the plan for discharge with the patient and/or their surrogate at bedside prior to discharge and they were in agreement with the plan and verbalized understanding of the return precautions provided. All questions answered to the best of my ability. Ultimately, the patient was discharged in stable condition with stable vital signs. I am reassured that they are capable of close follow up and good social support at home.   Clinical Impression:  1. Less than [redacted] weeks gestation of pregnancy     Rx / DC Orders ED Discharge Orders          Ordered    Prenatal Vit-Fe Fumarate-FA (PRENATAL COMPLETE) 14-0.4 MG TABS  Daily        06/22/23 2202            The plan for this patient was discussed with Dr. Dalene Seltzer, who voiced agreement and who oversaw evaluation and treatment of this patient.   Clinical Complexity A medically appropriate history, review of systems, and physical exam was performed.  My independent interpretations of EKG, labs, and radiology are documented in the ED course above.   If decision rules were used in this patient's evaluation, they are listed below.   Click here for ABCD2, HEART and other calculatorsREFRESH Note before signing   Patient's presentation is most consistent with acute presentation with potential threat to life or bodily function.  Medical Decision Making Amount and/or Complexity of Data Reviewed External Data Reviewed: notes.    Details: DC summary from  Radiology:  Decision-making details documented in ED Course.  Risk OTC drugs. Diagnosis or treatment significantly limited by social determinants of health.    HPI/ROS      See MDM section for pertinent HPI and ROS. A complete ROS was performed with pertinent positives/negatives noted above.   Past Medical History:  Diagnosis Date   Asthma    Preeclampsia    Preterm labor     Past  Surgical History:  Procedure Laterality Date   CESAREAN SECTION     CESAREAN SECTION N/A 11/16/2019   Procedure: CESAREAN SECTION;  Surgeon: Reva Bores, MD;  Location: [facility] LD ORS;  Service: Obstetrics;  Laterality: N/A;   TONSILLECTOMY        Physical Exam   Vitals:   06/22/23 1714 06/22/23 2003 06/22/23 2015 06/22/23 2047  BP: (!) 137/90  (!) 149/98   Pulse: 71 62 77   Resp: 18   17  Temp: 97.7 F (36.5 C)     TempSrc:      SpO2: 100% 100% 100%   Weight:      Height:        Physical Exam Vitals and nursing note reviewed.  Constitutional:      General: She is not in acute distress.    Appearance: She is well-developed.  HENT:     Head: Normocephalic and atraumatic.  Eyes:     Conjunctiva/sclera: Conjunctivae normal.  Cardiovascular:     Rate and Rhythm: Normal rate and regular rhythm.     Heart sounds: No murmur heard. Pulmonary:     Effort: Pulmonary effort is normal. No respiratory distress.     Breath sounds: Normal breath sounds.  Abdominal:     Palpations: Abdomen is soft.     Tenderness: There is no abdominal tenderness.  Musculoskeletal:        General: No swelling.     Cervical back: Neck supple.  Skin:    General: Skin is warm and dry.     Capillary Refill: Capillary refill takes less than 2 seconds.  Neurological:     Mental Status: She is alert.  Psychiatric:        Mood and Affect: Mood normal.      Procedures   If procedures were preformed on this patient, they are listed below:  Procedures   Fayrene Helper, MD Emergency Medicine PGY-2   Please note that this documentation was produced with the assistance of voice-to-text technology and may contain errors.    Fayrene Helper, MD 06/22/23 4401    Alvira Monday, MD 06/25/23 2155

## 2023-06-22 NOTE — Discharge Instructions (Addendum)
You were seen today for abdominal pain.  While you are here we got labs and imaging and performed physical exam.  All of these were reassuring and there is no need for any further intervention in the emergency department at this time.  You are approximately [redacted] weeks pregnant.  Please begin taking the prenatal vitamins that I sent.  And I have provided information for the Center for [facility]; please call them to schedule an appointment for continued care as soon as you are able.  Come back to the emergency department if you have any new or worsening concerns including any worsening vaginal bleeding.

## 2023-06-22 NOTE — ED Triage Notes (Addendum)
Pt to ED c/o abdominal pain/abdominal cramping x 2 days. Reports central in location. Denies aggravating factors. Reports little vaginal bleeding when wipe. Reports last period  6/8 -Unknown if pregnant

## 2023-06-22 NOTE — ED Notes (Signed)
Assumed care of patient here c/o 2 day hx of generalized abdominal cramps and vaginal spotting when wiping. Pt last known menstrual date 05/12/23 . Patient ambulatory to room with steady gait respirations even and non labored bowel sounds audible and present in all quads soft and non tender to palpation denies n/v/d. Patient pending OB US

## 2023-06-22 NOTE — ED Provider Triage Note (Signed)
Emergency Medicine Provider Triage Evaluation Note  KARLEE STAFF , a 32 y.o. female  was evaluated in triage.  Pt complains of abdominal pain.  Review of Systems  Positive:  Negative:   Physical Exam  BP (!) 150/100 (BP Location: Right Arm)   Pulse 80   Temp 99 F (37.2 C) (Oral)   Resp 14   Ht 5\' 6"  (1.676 m)   Wt 68 kg   SpO2 99%   BMI 24.21 kg/m  Gen:   Awake, no distress   Resp:  Normal effort  MSK:   Moves extremities without difficulty  Other:    Medical Decision Making  Medically screening exam initiated at 2:35 PM.  Appropriate orders placed.  ANAELLE DUNTON was informed that the remainder of the evaluation will be completed by another provider, this initial triage assessment does not replace that evaluation, and the importance of remaining in the ED until their evaluation is complete.  [id number] Complaining of intermittent central abdominal cramping x2 days. 7/10 severity. Also complaining of urinary frequency and light vaginal bleeding. LMP 05/12/2023. Last BM today.  Denies fever, chest pain, dyspnea, nausea, vomiting, diarrhea, hematuria, hematochezia, dysuria. Denies vaginal discharge/irritation.   Dorthy Cooler, [place] 06/22/23 1441

## 2023-06-27 ENCOUNTER — Encounter: Payer: Self-pay | Admitting: Family Medicine

## 2023-07-06 ENCOUNTER — Encounter (HOSPITAL_COMMUNITY): Payer: Self-pay | Admitting: *Deleted

## 2023-07-06 ENCOUNTER — Other Ambulatory Visit: Payer: Self-pay

## 2023-07-06 ENCOUNTER — Inpatient Hospital Stay (HOSPITAL_COMMUNITY)
Admission: AD | Admit: 2023-07-06 | Discharge: 2023-07-06 | Disposition: A | Discharge status: Home / Self Care | Payer: Medicaid Other | Attending: Obstetrics & Gynecology | Admitting: Obstetrics & Gynecology

## 2023-07-06 DIAGNOSIS — R6884 Jaw pain: Secondary | ICD-10-CM | POA: Insufficient documentation

## 2023-07-06 DIAGNOSIS — Z3A08 8 weeks gestation of pregnancy: Secondary | ICD-10-CM | POA: Insufficient documentation

## 2023-07-06 DIAGNOSIS — O26891 Other specified pregnancy related conditions, first trimester: Secondary | ICD-10-CM | POA: Insufficient documentation

## 2023-07-06 DIAGNOSIS — K047 Periapical abscess without sinus: Secondary | ICD-10-CM | POA: Insufficient documentation

## 2023-07-06 MED ORDER — AMOXICILLIN-POT CLAVULANATE 875-125 MG PO TABS: 1.0000 | ORAL_TABLET | Freq: Two times a day (BID) | ORAL | 0 refills | Status: DC

## 2023-07-06 MED ORDER — HYDROCODONE-ACETAMINOPHEN 5-325 MG PO TABS: 2.0000 | ORAL_TABLET | ORAL | 0 refills | Status: DC | PRN

## 2023-07-06 NOTE — MAU Note (Signed)
Stacy Christian is a 32 y.o. at [redacted]w[redacted]d here in MAU reporting: she thinks her filling is coming out of her tooth and has right jaw pain.  Reports has dental appt on Monday 8/5/204.  Reports pain subsides with Tylenol, but returns.  Last took Tylenol @ 0700, taking regular strength x2 tabs. Denies pregnancy complaints LMP: NA Onset of complaint: 1-2 days ago Pain score: 9 Vitals:   07/06/23 1123  BP: 127/86  Pulse: 79  Resp: 18  Temp: 98.7 F (37.1 C)  SpO2: 100%     FHT:NA Lab orders placed from triage:  None

## 2023-07-06 NOTE — MAU Provider Note (Signed)
Event Date/Time   First Provider Initiated Contact with Patient 07/06/23 1251      S Ms. Stacy Christian is a 32 y.o. [id number] patient who presents to MAU today with complaint of new onset jaw pain for the last 2 days. Patient states that she had a temporary filling placed and she believes it has come out. She states that she is having constant jaw pain that is usually temporarily relieved with PO Tylenol but 3 hours post meds she begins to hurt again. She also reports swelling and heat to the site. Denies fever. She denies pregnancy complaints.   O BP 127/86 (BP Location: Right Arm)   Pulse 79   Temp 98.7 F (37.1 C) (Oral)   Resp 18   Ht 5\' 6"  (1.676 m)   Wt 68.7 kg   SpO2 100%   BMI 24.45 kg/m  Physical Exam Vitals and nursing note reviewed.  Constitutional:      General: She is not in acute distress.    Appearance: Normal appearance.  HENT:     Head: Normocephalic.  Pulmonary:     Effort: Pulmonary effort is normal.  Musculoskeletal:     Cervical back: Normal range of motion.  Skin:    General: Skin is warm and dry.  Neurological:     Mental Status: She is alert and oriented to person, place, and time.  Psychiatric:        Mood and Affect: Mood normal.     A Medical screening exam complete - Concern for tooth abscess dental infection.   P 1. Tooth abscess   2. Jaw pain   3. [redacted] weeks gestation of pregnancy    - Rx for Augmentin and Short course of Hydrocodone sent to outpatient pharmacy for pick up.  - Worsening signs and return precautions reviewed - Patient had a dental appointment scheduled for Monday 07/09/23. Encouraged to keep appointment.  - Discharge from MAU in stable condition - Patient given the option of transfer to [facility] for further evaluation or seek care in outpatient facility of choice  - Patient may return to MAU as needed   Carlynn Herald, CNM 07/06/2023 12:51 PM

## 2023-07-25 ENCOUNTER — Telehealth (INDEPENDENT_AMBULATORY_CARE_PROVIDER_SITE_OTHER): Payer: Medicaid Other

## 2023-07-25 DIAGNOSIS — O0990 Supervision of high risk pregnancy, unspecified, unspecified trimester: Secondary | ICD-10-CM | POA: Insufficient documentation

## 2023-07-25 DIAGNOSIS — F4321 Adjustment disorder with depressed mood: Secondary | ICD-10-CM

## 2023-07-25 DIAGNOSIS — Z3689 Encounter for other specified antenatal screening: Secondary | ICD-10-CM

## 2023-07-25 MED ORDER — PRENATAL 27-1 MG PO TABS: 1.0000 | ORAL_TABLET | Freq: Every day | ORAL | 11 refills | Status: AC

## 2023-07-25 MED ORDER — BLOOD PRESSURE KIT DEVI
1.0000 | Freq: Once | 0 refills | Status: AC
Start: 2023-07-25 — End: 2023-07-25

## 2023-07-25 NOTE — Progress Notes (Signed)
New OB Intake  I connected with Stacy Christian  on 07/25/23 at 11:15 AM EDT by MyChart Video Visit and verified that I am speaking with the correct person using two identifiers. Nurse is located at [facility] and pt is located at home.  I discussed the limitations, risks, security and privacy concerns of performing an evaluation and management service by telephone and the availability of in person appointments. I also discussed with the patient that there may be a patient responsible charge related to this service. The patient expressed understanding and agreed to proceed.  I explained I am completing New OB Intake today. We discussed EDD of 02/15/2024 by LMP. Pt is [id number]. I reviewed her allergies, medications and Medical/Surgical/OB history.    Patient Active Problem List   Diagnosis Date Noted   Supervision of high risk pregnancy, antepartum 07/25/2023   History of cesarean section 2019-11-29   History of neonatal death September 26, 2019   History of gestational hypertension 07/11/2019   Asthma    History of premature delivery, currently pregnant 08/07/2017   History of classical cesarean section 08/07/2017   Concerns addressed today Patient needs assistance with utilities and clothing for children. Ready, Ready Prenatal Navigation consent form sent over MyChart for further assistance.  Patient became tearful during family history review. She has been struggling mentally since losing her father, grandmother, and uncle in the past few years. Offered [facility] referral which patient accepts.   Delivery Plans Plans to deliver at [facility]. Discussed the nature of our practice with multiple providers including residents and students. Due to the size of the practice, the delivering provider may not be the same as those providing prenatal care. Not a water birth candidate.  MyChart/Babyscripts MyChart access verified. I explained pt will have some visits in office and some virtually. Babyscripts  instructions given and order placed.  Blood Pressure Cuff/Weight Scale Blood pressure cuff ordered for patient to pick-up from [employer]. Explained after first prenatal appt pt will check weekly and document in Babyscripts.  Anatomy US Explained first scheduled [place] will be around 19 weeks. Anatomy US scheduled for 08/01/23.  Is patient a CenteringPregnancy candidate?  Declined; due to group setting  Is patient a Mom+Baby Combined Care candidate?  Declined    Is patient a candidate for Babyscripts Optimization? No- complex OB history  First visit review I reviewed new OB appt with patient. Explained pt will be seen by Catalina Antigua, MD at first visit. Discussed Avelina Laine genetic screening with patient. Horizon 14 panel previously negative. Pt desires Panorama. Will need routine labs, Panorama, A1C, Pr/Cr ratio, and CMP. Pt agreeable to Pap at first appt.  Last Pap Diagnosis  Date Value Ref Range Status  08/07/2017   Final   NEGATIVE FOR INTRAEPITHELIAL LESIONS OR MALIGNANCY.  08/07/2017   Final   FUNGAL ORGANISMS PRESENT CONSISTENT WITH CANDIDA SPP.   Marjo Bicker, RN 07/25/2023  4:59 PM

## 2023-07-31 NOTE — BH Specialist Note (Deleted)
Integrated Behavioral Health Initial In-Person Visit  MRN: [id number] Name: Stacy Christian  Number of Integrated Behavioral Health Clinician visits: No data recorded Session Start time: No data recorded   Session End time: No data recorded Total time in minutes: No data recorded  Types of Service: Individual psychotherapy  Interpretor:No. Interpretor Name and Language: n/a   Warm Hand Off Completed.        Subjective: Stacy Christian is a 32 y.o. female accompanied by {CHL AMB ACCOMPANIED WU:[id number]} Patient was referred by Catalina Antigua, MD for positive screening/grief. Patient reports the following symptoms/concerns: *** Duration of problem: ***; Severity of problem: {Mild/Moderate/Severe:20260}  Objective: Mood: {BHH MOOD:22306} and Affect: {BHH AFFECT:22307} Risk of harm to self or others: {CHL AMB BH Suicide Current Mental Status:21022748}  Life Context: Family and Social: *** School/Work: *** Self-Care: *** Life Changes: Current pregnancy; ***  Patient and/or Family's Strengths/Protective Factors: {CHL AMB BH PROTECTIVE FACTORS:[phone]}  Goals Addressed: Patient will: Reduce symptoms of: {IBH Symptoms:21014056} Increase knowledge and/or ability of: {IBH Patient Tools:21014057}  Demonstrate ability to: {IBH Goals:21014053}  Progress towards Goals: {CHL AMB BH PROGRESS TOWARDS GOALS:[phone]}  Interventions: Interventions utilized: {IBH Interventions:21014054}  Standardized Assessments completed: {IBH Screening Tools:21014051}  Patient and/or Family Response: Patient agrees with treatment plan.   Patient Centered Plan: Patient is on the following Treatment Plan(s):  IBH  Assessment: Patient currently experiencing ***.   Patient may benefit from psychoeducation and brief therapeutic interventions regarding coping with symptoms of *** .  Plan: Follow up with behavioral health clinician on : *** Behavioral recommendations:   -*** -*** Referral(s): {IBH Referrals:21014055}  Rae Lips, LCSW     08/01/2023   10:24 AM 11/05/2019    8:29 AM 10/15/2019   11:07 AM 07/09/2019    1:43 PM 11/05/2017   11:56 AM  Depression screen PHQ 2/9  Decreased Interest 0 0 0 0 0  Down, Depressed, Hopeless 2 0 1 1 2   PHQ - 2 Score 2 0 1 1 2   Altered sleeping 0 1 1 1 1   Tired, decreased energy 1 1 1 1 2   Change in appetite 2 1 2 1  0  Feeling bad or failure about yourself  0 0 0 0 1  Trouble concentrating 0 0 0 1 0  Moving slowly or fidgety/restless 0 0 0 0 0  Suicidal thoughts 0 0 0 0 0  PHQ-9 Score 5 3 5 5 6       08/01/2023   10:24 AM 11/05/2019    8:29 AM 10/15/2019   11:08 AM 07/09/2019    1:41 PM  GAD 7 : Generalized Anxiety Score  Nervous, Anxious, on Edge 1 1 1  0  Control/stop worrying 2 1 1  0  Worry too much - different things 2 1 1 2   Trouble relaxing 0 0 0 1  Restless 0 0 0 0  Easily annoyed or irritable 2 1 1 1   Afraid - awful might happen 1 0 0 1  Total GAD 7 Score 8 4 4  5

## 2023-08-01 ENCOUNTER — Other Ambulatory Visit (HOSPITAL_COMMUNITY)
Admission: RE | Admit: 2023-08-01 | Discharge: 2023-08-01 | Disposition: A | Discharge status: Home / Self Care | Payer: Medicaid Other | Source: Ambulatory Visit | Attending: Obstetrics and Gynecology | Admitting: Obstetrics and Gynecology

## 2023-08-01 ENCOUNTER — Other Ambulatory Visit: Payer: Self-pay

## 2023-08-01 ENCOUNTER — Encounter: Payer: Self-pay | Admitting: Obstetrics and Gynecology

## 2023-08-01 ENCOUNTER — Ambulatory Visit (INDEPENDENT_AMBULATORY_CARE_PROVIDER_SITE_OTHER): Payer: Medicaid Other | Admitting: Obstetrics and Gynecology

## 2023-08-01 VITALS — BP 136/89 | HR 75 | Wt 155.9 lb

## 2023-08-01 DIAGNOSIS — O0991 Supervision of high risk pregnancy, unspecified, first trimester: Secondary | ICD-10-CM

## 2023-08-01 DIAGNOSIS — O0990 Supervision of high risk pregnancy, unspecified, unspecified trimester: Secondary | ICD-10-CM | POA: Insufficient documentation

## 2023-08-01 DIAGNOSIS — Z3A11 11 weeks gestation of pregnancy: Secondary | ICD-10-CM | POA: Diagnosis not present

## 2023-08-01 DIAGNOSIS — O09291 Supervision of pregnancy with other poor reproductive or obstetric history, first trimester: Secondary | ICD-10-CM | POA: Diagnosis not present

## 2023-08-01 DIAGNOSIS — Z98891 History of uterine scar from previous surgery: Secondary | ICD-10-CM | POA: Diagnosis not present

## 2023-08-01 DIAGNOSIS — Z3482 Encounter for supervision of other normal pregnancy, second trimester: Secondary | ICD-10-CM | POA: Diagnosis not present

## 2023-08-01 DIAGNOSIS — O09299 Supervision of pregnancy with other poor reproductive or obstetric history, unspecified trimester: Secondary | ICD-10-CM

## 2023-08-01 MED ORDER — ASPIRIN 81 MG PO TBEC: 81.0000 mg | DELAYED_RELEASE_TABLET | Freq: Every day | ORAL | 2 refills | Status: DC

## 2023-08-01 NOTE — Addendum Note (Signed)
Addended by: Catalina Antigua on: 08/01/2023 10:32 AM   Modules accepted: Orders

## 2023-08-01 NOTE — Patient Instructions (Signed)
First Trimester of Pregnancy  The first trimester of pregnancy starts on the first day of your last menstrual period until the end of week 12. This is months 1 through 3 of pregnancy. A week after a sperm fertilizes an egg, the egg will implant into the wall of the uterus and begin to develop into a baby. By the end of 12 weeks, all the baby's organs will be formed and the baby will be 2-3 inches in size. Body changes during your first trimester Your body goes through many changes during pregnancy. The changes vary and generally return to normal after your baby is born. Physical changes You may gain or lose weight. Your breasts may begin to grow larger and become tender. The tissue that surrounds your nipples (areola) may become darker. Dark spots or blotches (chloasma or mask of pregnancy) may develop on your face. You may have changes in your hair. These can include thickening or thinning of your hair or changes in texture. Health changes You may feel nauseous, and you may vomit. You may have heartburn. You may develop headaches. You may develop constipation. Your gums may bleed and may be sensitive to brushing and flossing. Other changes You may tire easily. You may urinate more often. Your menstrual periods will stop. You may have a loss of appetite. You may develop cravings for certain kinds of food. You may have changes in your emotions from day to day. You may have more vivid and strange dreams. Follow these instructions at home: Medicines Follow your health care provider's instructions regarding medicine use. Specific medicines may be either safe or unsafe to take during pregnancy. Do not take any medicines unless told to by your health care provider. Take a prenatal vitamin that contains at least 600 micrograms (mcg) of folic acid. Eating and drinking Eat a healthy diet that includes fresh fruits and vegetables, whole grains, good sources of protein such as meat, eggs, or tofu,  and low-fat dairy products. Avoid raw meat and unpasteurized juice, milk, and cheese. These carry germs that can harm you and your baby. If you feel nauseous or you vomit: Eat 4 or 5 small meals a day instead of 3 large meals. Try eating a few soda crackers. Drink liquids between meals instead of during meals. You may need to take these actions to prevent or treat constipation: Drink enough fluid to keep your urine pale yellow. Eat foods that are high in fiber, such as beans, whole grains, and fresh fruits and vegetables. Limit foods that are high in fat and processed sugars, such as fried or sweet foods. Activity Exercise only as directed by your health care provider. Most people can continue their usual exercise routine during pregnancy. Try to exercise for 30 minutes at least 5 days a week. Stop exercising if you develop pain or cramping in the lower abdomen or lower back. Avoid exercising if it is very hot or humid or if you are at high altitude. Avoid heavy lifting. If you choose to, you may have sex unless your health care provider tells you not to. Relieving pain and discomfort Wear a good support bra to relieve breast tenderness. Rest with your legs elevated if you have leg cramps or low back pain. If you develop bulging veins (varicose veins) in your legs: Wear support hose as told by your health care provider. Elevate your feet for 15 minutes, 3-4 times a day. Limit salt in your diet. Safety Wear your seat belt at all times when  driving or riding in a car. Talk with your health care provider if someone is verbally or physically abusive to you. Talk with your health care provider if you are feeling sad or have thoughts of hurting yourself. Lifestyle Do not use hot tubs, steam rooms, or saunas. Do not douche. Do not use tampons or scented sanitary pads. Do not use herbal remedies, alcohol, illegal drugs, or medicines that are not approved by your health care provider. Chemicals  in these products can harm your baby. Do not use any products that contain nicotine or tobacco, such as cigarettes, e-cigarettes, and chewing tobacco. If you need help quitting, ask your health care provider. Avoid cat litter boxes and soil used by cats. These carry germs that can cause birth defects in the baby and possibly loss of the unborn baby (fetus) by miscarriage or stillbirth. General instructions During routine prenatal visits in the first trimester, your health care provider will do a physical exam, perform necessary tests, and ask you how things are going. Keep all follow-up visits. This is important. Ask for help if you have counseling or nutritional needs during pregnancy. Your health care provider can offer advice or refer you to specialists for help with various needs. Schedule a dentist appointment. At home, brush your teeth with a soft toothbrush. Floss gently. Write down your questions. Take them to your prenatal visits. Where to find more information American Pregnancy Association: americanpregnancy.org [employer]: https://www.todd-brady.net/ Office on [employer] Health: MightyRew[employer].nz Contact a health care provider if you have: Dizziness. A fever. Mild pelvic cramps, pelvic pressure, or nagging pain in the abdominal area. Nausea, vomiting, or diarrhea that lasts for 24 hours or longer. A bad-smelling vaginal discharge. Pain when you urinate. Known exposure to a contagious illness, such as chickenpox, measles, Zika virus, HIV, or hepatitis. Get help right away if you have: Spotting or bleeding from your vagina. Severe abdominal cramping or pain. Shortness of breath or chest pain. Any kind of trauma, such as from a fall or a car crash. New or increased pain, swelling, or redness in an arm or leg. Summary The first trimester of pregnancy starts on the first day of your last menstrual period until the end of week  12 (months 1 through 3). Eating 4 or 5 small meals a day rather than 3 large meals may help to relieve nausea and vomiting. Do not use any products that contain nicotine or tobacco, such as cigarettes, e-cigarettes, and chewing tobacco. If you need help quitting, ask your health care provider. Keep all follow-up visits. This is important. This information is not intended to replace advice given to you by your health care provider. Make sure you discuss any questions you have with your health care provider. Document Revised: 04/28/2020 Document Reviewed: 03/04/2020 Elsevier Patient Education  2024 Elsevier Inc[employer]  Second Trimester of Pregnancy  The second trimester of pregnancy is from week 13 through week 27. This is months 4 through 6 of pregnancy. The second trimester is often a time when you feel your best. Your body has adjusted to being pregnant, and you begin to feel better physically. During the second trimester: Morning sickness has lessened or stopped completely. You may have more energy. You may have an increase in appetite. The second trimester is also a time when the unborn baby (fetus) is growing rapidly. At the end of the sixth month, the fetus may be up to 12 inches long and weigh about 1 pounds. You will likely  begin to feel the baby move (quickening) between 16 and 20 weeks of pregnancy. Body changes during your second trimester Your body continues to go through many changes during your second trimester. The changes vary and generally return to normal after the baby is born. Physical changes Your weight will continue to increase. You will notice your lower abdomen bulging out. You may begin to get stretch marks on your hips, abdomen, and breasts. Your breasts will continue to grow and to become tender. Dark spots or blotches (chloasma or mask of pregnancy) may develop on your face. A dark line from your belly button to the pubic area (linea nigra) may appear. You may have  changes in your hair. These can include thickening of your hair, rapid growth, and changes in texture. Some people also have hair loss during or after pregnancy, or hair that feels dry or thin. Health changes You may develop headaches. You may have heartburn. You may develop constipation. You may develop hemorrhoids or swollen, bulging veins (varicose veins). Your gums may bleed and may be sensitive to brushing and flossing. You may urinate more often because the fetus is pressing on your bladder. You may have back pain. This is caused by: Weight gain. Pregnancy hormones that are relaxing the joints in your pelvis. A shift in weight and the muscles that support your balance. Follow these instructions at home: Medicines Follow your health care provider's instructions regarding medicine use. Specific medicines may be either safe or unsafe to take during pregnancy. Do not take any medicines unless approved by your health care provider. Take a prenatal vitamin that contains at least 600 micrograms (mcg) of folic acid. Eating and drinking Eat a healthy diet that includes fresh fruits and vegetables, whole grains, good sources of protein such as meat, eggs, or tofu, and low-fat dairy products. Avoid raw meat and unpasteurized juice, milk, and cheese. These carry germs that can harm you and your baby. You may need to take these actions to prevent or treat constipation: Drink enough fluid to keep your urine pale yellow. Eat foods that are high in fiber, such as beans, whole grains, and fresh fruits and vegetables. Limit foods that are high in fat and processed sugars, such as fried or sweet foods. Activity Exercise only as directed by your health care provider. Most people can continue their usual exercise routine during pregnancy. Try to exercise for 30 minutes at least 5 days a week. Stop exercising if you develop contractions in your uterus. Stop exercising if you develop pain or cramping in the  lower abdomen or lower back. Avoid exercising if it is very hot or humid or if you are at a high altitude. Avoid heavy lifting. If you choose to, you may have sex unless your health care provider tells you not to. Relieving pain and discomfort Wear a supportive bra to prevent discomfort from breast tenderness. Take warm sitz baths to soothe any pain or discomfort caused by hemorrhoids. Use hemorrhoid cream if your health care provider approves. Rest with your legs raised (elevated) if you have leg cramps or low back pain. If you develop varicose veins: Wear support hose as told by your health care provider. Elevate your feet for 15 minutes, 3-4 times a day. Limit salt in your diet. Safety Wear your seat belt at all times when driving or riding in a car. Talk with your health care provider if someone is verbally or physically abusive to you. Lifestyle Do not use hot tubs, steam rooms,  or saunas. Do not douche. Do not use tampons or scented sanitary pads. Avoid cat litter boxes and soil used by cats. These carry germs that can cause birth defects in the baby and possibly loss of the fetus by miscarriage or stillbirth. Do not use herbal remedies, alcohol, illegal drugs, or medicines that are not approved by your health care provider. Chemicals in these products can harm your baby. Do not use any products that contain nicotine or tobacco, such as cigarettes, e-cigarettes, and chewing tobacco. If you need help quitting, ask your health care provider. General instructions During a routine prenatal visit, your health care provider will do a physical exam and other tests. He or she will also discuss your overall health. Keep all follow-up visits. This is important. Ask your health care provider for a referral to a local prenatal education class. Ask for help if you have counseling or nutritional needs during pregnancy. Your health care provider can offer advice or refer you to specialists for help  with various needs. Where to find more information American Pregnancy Association: americanpregnancy.org [employer]: https://www.todd-brady.net/ Office on [employer] Health: MightyRew[employer]co.nz Contact a health care provider if you have: A headache that does not go away when you take medicine. Vision changes or you see spots in front of your eyes. Mild pelvic cramps, pelvic pressure, or nagging pain in the abdominal area. Persistent nausea, vomiting, or diarrhea. A bad-smelling vaginal discharge or foul-smelling urine. Pain when you urinate. Sudden or extreme swelling of your face, hands, ankles, feet, or legs. A fever. Get help right away if you: Have fluid leaking from your vagina. Have spotting or bleeding from your vagina. Have severe abdominal cramping or pain. Have difficulty breathing. Have chest pain. Have fainting spells. Have not felt your baby move for the time period told by your health care provider. Have new or increased pain, swelling, or redness in an arm or leg. Summary The second trimester of pregnancy is from week 13 through week 27 (months 4 through 6). Do not use herbal remedies, alcohol, illegal drugs, or medicines that are not approved by your health care provider. Chemicals in these products can harm your baby. Exercise only as directed by your health care provider. Most people can continue their usual exercise routine during pregnancy. Keep all follow-up visits. This is important. This information is not intended to replace advice given to you by your health care provider. Make sure you discuss any questions you have with your health care provider. Document Revised: 04/28/2020 Document Reviewed: 03/04/2020 Elsevier Patient Education  2024 [employer].

## 2023-08-01 NOTE — Progress Notes (Addendum)
Subjective:    Stacy Christian is a [id number] [redacted]w[redacted]d being seen today for her first obstetrical visit.  Her obstetrical history is significant for previous preterm delivery x 3 due to severe preeclampsia, previous classical cesarean section with first 23 week delivery (placental abruption with neonatal demise). Patient does intend to breast feed. Pregnancy history fully reviewed.  Patient reports no complaints.  Vitals:   08/01/23 0943  BP: 136/89  Pulse: 75  Weight: 155 lb 14.4 oz (70.7 kg)    HISTORY: OB History  Gravida Para Term Preterm AB Living  5 3   3 1 2   SAB IAB Ectopic Multiple Live Births  1     0 3    # Outcome Date GA Lbr Len/2nd Weight Sex Type Anes PTL Lv  5 Current           4 Preterm 11/16/19 [redacted]w[redacted]d  4 lb 13.4 oz (2.195 kg) M CS-LTranv Spinal  LIV  3 Preterm 12/07/17 [redacted]w[redacted]d   F CS-Unspec   LIV     Complications: Gestational hypertension, IUGR (intrauterine growth restriction) affecting care of mother  2 SAB 12/23/14 [redacted]w[redacted]d         1 Preterm 06/26/07 [redacted]w[redacted]d  1 lb 7 oz (0.652 kg) F CS-Classical  Y ND     Complications: Abruptio Placenta, Preterm labor   Past Medical History:  Diagnosis Date   Asthma    Preeclampsia    Preterm labor    Supervision of high risk pregnancy, antepartum 07/09/2019              Nursing Staff    Provider      Office Location     [facility]    Dating     LMP + 8 wk [place]      Language      English    Anatomy US     WNL--f/u at 28 wks      Flu Vaccine     08/06/2019    Genetic Screen     NIPS:  WNL           TDaP vaccine      10/15/19    Hgb A1C or   GTT    Early 5.0 A1c  Third trimester: 68/100/81      Rhogam     n/a         LAB RESULTS       Feeding Plan    Breast       Past Surgical History:  Procedure Laterality Date   CESAREAN SECTION     CESAREAN SECTION N/A 11/16/2019   Procedure: CESAREAN SECTION;  Surgeon: Reva Bores, MD;  Location: [facility] LD ORS;  Service: Obstetrics;  Laterality: N/A;   TONSILLECTOMY     Family History  Problem  Relation Age of Onset   Heart disease Mother    Diabetes Mother    Hypertension Mother    Diabetes Father    Hypertension Father    Heart disease Father    Cancer Maternal Grandmother    Diabetes Maternal Grandmother    Cancer Maternal Grandfather    Heart disease Paternal Grandmother    Heart disease Paternal Grandfather      Exam    Uterus:   12-weeks  Pelvic Exam:    Perineum: Normal Perineum   Vulva: normal   Vagina:  normal mucosa, normal discharge   pH:    Cervix: multiparous appearance and cervix is closed and long   Adnexa:  no mass, fullness, tenderness   Bony Pelvis: gynecoid  System: Breast:  normal appearance, no masses or tenderness   Skin: normal coloration and turgor, no rashes    Neurologic: oriented, no focal deficits   Extremities: normal strength, tone, and muscle mass   HEENT extra ocular movement intact   Mouth/Teeth mucous membranes moist, pharynx normal without lesions and dental hygiene good   Neck supple and no masses   Cardiovascular: regular rate and rhythm   Respiratory:  ear and throat exam is normal, chest clear, no wheezing, crepitations, rhonchi, normal symmetric air entry   Abdomen: soft, non-tender; bowel sounds normal; no masses,  no organomegaly   Urinary:       Assessment:    Pregnancy: [id number] Patient Active Problem List   Diagnosis Date Noted   Supervision of high risk pregnancy, antepartum 07/25/2023   History of cesarean section 11/24/2019   History of neonatal death September 21, 2019   Hx of preeclampsia, prior pregnancy, currently pregnant 07/11/2019   Asthma    History of premature delivery, currently pregnant 08/07/2017   History of classical cesarean section 08/07/2017        Plan:     Initial labs drawn. Prenatal vitamins. Problem list reviewed and updated. Genetic Screening discussed : Panorama ordered.  Ultrasound discussed; fetal survey: ordered. Baseline labs ordered Rx ASA provided Patient referred to [facility]  Cards  Follow up in 4 weeks. 50% of 30 min visit spent on counseling and coordination of care.     Khyler Eschmann 08/01/2023

## 2023-08-02 DIAGNOSIS — O0990 Supervision of high risk pregnancy, unspecified, unspecified trimester: Secondary | ICD-10-CM | POA: Diagnosis not present

## 2023-08-02 LAB — CBC/D/PLT+RPR+RH+ABO+RUBIGG...
Antibody Screen: NEGATIVE
Basophils Absolute: 0 10*3/uL (ref 0.0–0.2)
Basos: 1 %
EOS (ABSOLUTE): 0.2 10*3/uL (ref 0.0–0.4)
Eos: 2 %
HCV Ab: NONREACTIVE
HIV Screen 4th Generation wRfx: NONREACTIVE
Hematocrit: 35.9 % (ref 34.0–46.6)
Hemoglobin: 12.2 g/dL (ref 11.1–15.9)
Hepatitis B Surface Ag: NEGATIVE
Immature Grans (Abs): 0 10*3/uL (ref 0.0–0.1)
Immature Granulocytes: 0 %
Lymphocytes Absolute: 1.9 10*3/uL (ref 0.7–3.1)
Lymphs: 27 %
MCH: 29.7 pg (ref 26.6–33.0)
MCHC: 34 g/dL (ref 31.5–35.7)
MCV: 87 fL (ref 79–97)
Monocytes Absolute: 0.5 10*3/uL (ref 0.1–0.9)
Monocytes: 7 %
Neutrophils Absolute: 4.5 10*3/uL (ref 1.4–7.0)
Neutrophils: 63 %
Platelets: 236 10*3/uL (ref 150–450)
RBC: 4.11 x10E6/uL (ref 3.77–5.28)
RDW: 12.8 % (ref 11.7–15.4)
RPR Ser Ql: NONREACTIVE
Rh Factor: POSITIVE
Rubella Antibodies, IGG: 1.7 {index} (ref >0.99)
WBC: 7 10*3/uL (ref 3.4–10.8)

## 2023-08-02 LAB — COMPREHENSIVE METABOLIC PANEL
ALT: 13 IU/L (ref 0–32)
AST: 18 IU/L (ref 0–40)
Albumin: 4.4 g/dL (ref 3.9–4.9)
Alkaline Phosphatase: 53 IU/L (ref 44–121)
BUN/Creatinine Ratio: 9 (ref 9–23)
BUN: 5 mg/dL — ABNORMAL LOW (ref 6–20)
Bilirubin Total: 0.4 mg/dL (ref 0.0–1.2)
CO2: 22 mmol/L (ref 20–29)
Calcium: 9.3 mg/dL (ref 8.7–10.2)
Chloride: 99 mmol/L (ref 96–106)
Creatinine, Ser: 0.54 mg/dL — ABNORMAL LOW (ref 0.57–1.00)
Globulin, Total: 3.3 g/dL (ref 1.5–4.5)
Glucose: 78 mg/dL (ref 70–99)
Potassium: 4.2 mmol/L (ref 3.5–5.2)
Sodium: 135 mmol/L (ref 134–144)
Total Protein: 7.7 g/dL (ref 6.0–8.5)
eGFR: 125 mL/min/{1.73_m2} (ref >59)

## 2023-08-02 LAB — HCV INTERPRETATION

## 2023-08-02 LAB — PROTEIN / CREATININE RATIO, URINE
Creatinine, Urine: 21.8 mg/dL
Protein, Ur: 4.2 mg/dL
Protein/Creat Ratio: 193 mg/g{creat} (ref 0–200)

## 2023-08-02 LAB — HEMOGLOBIN A1C
Est. average glucose Bld gHb Est-mCnc: 114 mg/dL
Hgb A1c MFr Bld: 5.6 % (ref 4.8–5.6)

## 2023-08-03 LAB — URINE CULTURE, OB REFLEX: Organism ID, Bacteria: NO GROWTH

## 2023-08-03 LAB — CULTURE, OB URINE

## 2023-08-09 LAB — CYTOLOGY - PAP
Chlamydia: NEGATIVE
Comment: NEGATIVE
Comment: NEGATIVE
Comment: NEGATIVE
Comment: NORMAL
Diagnosis: NEGATIVE
High risk HPV: NEGATIVE
Neisseria Gonorrhea: NEGATIVE
Trichomonas: NEGATIVE

## 2023-08-11 LAB — PANORAMA PRENATAL TEST FULL PANEL:PANORAMA TEST PLUS 5 ADDITIONAL MICRODELETIONS: FETAL FRACTION: 5.7

## 2023-08-14 ENCOUNTER — Institutional Professional Consult (permissible substitution): Payer: Medicaid Other

## 2023-08-22 ENCOUNTER — Encounter: Payer: Self-pay | Admitting: *Deleted

## 2023-08-23 ENCOUNTER — Encounter: Payer: Self-pay | Admitting: Obstetrics and Gynecology

## 2023-08-29 ENCOUNTER — Encounter: Payer: Self-pay | Admitting: Obstetrics & Gynecology

## 2023-08-29 ENCOUNTER — Other Ambulatory Visit: Payer: Self-pay

## 2023-08-29 ENCOUNTER — Ambulatory Visit (INDEPENDENT_AMBULATORY_CARE_PROVIDER_SITE_OTHER): Payer: Medicaid Other | Admitting: Obstetrics & Gynecology

## 2023-08-29 VITALS — BP 109/71 | HR 89 | Wt 159.0 lb

## 2023-08-29 DIAGNOSIS — O0992 Supervision of high risk pregnancy, unspecified, second trimester: Secondary | ICD-10-CM

## 2023-08-29 DIAGNOSIS — O0990 Supervision of high risk pregnancy, unspecified, unspecified trimester: Secondary | ICD-10-CM

## 2023-08-29 DIAGNOSIS — Z23 Encounter for immunization: Secondary | ICD-10-CM

## 2023-08-29 DIAGNOSIS — O09899 Supervision of other high risk pregnancies, unspecified trimester: Secondary | ICD-10-CM

## 2023-08-29 DIAGNOSIS — O09892 Supervision of other high risk pregnancies, second trimester: Secondary | ICD-10-CM

## 2023-08-29 DIAGNOSIS — Z98891 History of uterine scar from previous surgery: Secondary | ICD-10-CM

## 2023-08-29 DIAGNOSIS — O09299 Supervision of pregnancy with other poor reproductive or obstetric history, unspecified trimester: Secondary | ICD-10-CM

## 2023-08-29 DIAGNOSIS — O09292 Supervision of pregnancy with other poor reproductive or obstetric history, second trimester: Secondary | ICD-10-CM

## 2023-08-29 DIAGNOSIS — Z3A15 15 weeks gestation of pregnancy: Secondary | ICD-10-CM

## 2023-08-29 NOTE — Progress Notes (Signed)
   PRENATAL VISIT NOTE  Subjective:  Stacy Christian is a 32 y.o. [id number] at [redacted]w[redacted]d being seen today for ongoing prenatal care.  She is currently monitored for the following issues for this high-risk pregnancy and has History of premature delivery, currently pregnant; History of classical cesarean section; Hx of preeclampsia, prior pregnancy, currently pregnant; Asthma; History of neonatal death; History of cesarean section; and Supervision of high risk pregnancy, antepartum on their problem list.  Patient reports no complaints.  Contractions: Not present. Vag. Bleeding: None.  Movement: Absent. Denies leaking of fluid.   The following portions of the patient's history were reviewed and updated as appropriate: allergies, current medications, past family history, past medical history, past social history, past surgical history and problem list.   Objective:   Vitals:   08/29/23 1108  BP: 109/71  Pulse: 89  Weight: 159 lb (72.1 kg)    Fetal Status: Fetal Heart Rate (bpm): 152   Movement: Absent     General:  Alert, oriented and cooperative. Patient is in no acute distress.  Skin: Skin is warm and dry. No rash noted.   Cardiovascular: Normal heart rate noted  Respiratory: Normal respiratory effort, no problems with respiration noted  Abdomen: Soft, gravid, appropriate for gestational age.  Pain/Pressure: Present     Pelvic: Cervical exam deferred        Extremities: Normal range of motion.  Edema: None  Mental Status: Normal mood and affect. Normal behavior. Normal judgment and thought content.   Assessment and Plan:  Pregnancy: [id number] at [redacted]w[redacted]d 1. Hx of preeclampsia, prior pregnancy, currently pregnant Stable BP. Continue ASA.  2. History of premature delivery, currently pregnant PTL precautions reviewed.  3. History of classical cesarean section Delivery indicated by 37 weeks or earlier if indicated.  4. [redacted] weeks gestation of pregnancy 5. Supervision of high risk pregnancy,  antepartum - Flu vaccine trivalent PF, 6mos and older(Flulaval,Afluria,Fluarix,Fluzone) given Counseled about AFP, she declined.  Already scheduled for anatomy scan. No other complaints or concerns.  Routine obstetric precautions reviewed.  Please refer to After Visit Summary for other counseling recommendations.   Return in about 4 weeks (around 09/26/2023) for OFFICE OB VISIT (MD or APP).  Future Appointments  Date Time Provider Department Center  09/24/2023  8:15 AM [facility] NURSE [facility]  09/24/2023  8:30 AM [facility] US3 [facility]  09/24/2023  9:35 AM Reva Bores, MD [facility]    Jaynie Collins, MD

## 2023-09-12 DIAGNOSIS — Z8759 Personal history of other complications of pregnancy, childbirth and the puerperium: Secondary | ICD-10-CM | POA: Insufficient documentation

## 2023-09-24 ENCOUNTER — Ambulatory Visit: Payer: Medicaid Other | Admitting: *Deleted

## 2023-09-24 ENCOUNTER — Other Ambulatory Visit: Payer: Self-pay

## 2023-09-24 ENCOUNTER — Encounter: Payer: Medicaid Other | Admitting: Family Medicine

## 2023-09-24 ENCOUNTER — Other Ambulatory Visit: Payer: Self-pay | Admitting: *Deleted

## 2023-09-24 ENCOUNTER — Ambulatory Visit: Payer: Medicaid Other | Attending: Obstetrics and Gynecology

## 2023-09-24 VITALS — BP 136/73 | HR 85

## 2023-09-24 DIAGNOSIS — Z8759 Personal history of other complications of pregnancy, childbirth and the puerperium: Secondary | ICD-10-CM | POA: Diagnosis present

## 2023-09-24 DIAGNOSIS — O34219 Maternal care for unspecified type scar from previous cesarean delivery: Secondary | ICD-10-CM

## 2023-09-24 DIAGNOSIS — O99512 Diseases of the respiratory system complicating pregnancy, second trimester: Secondary | ICD-10-CM | POA: Diagnosis not present

## 2023-09-24 DIAGNOSIS — O09212 Supervision of pregnancy with history of pre-term labor, second trimester: Secondary | ICD-10-CM

## 2023-09-24 DIAGNOSIS — Z3A19 19 weeks gestation of pregnancy: Secondary | ICD-10-CM

## 2023-09-24 DIAGNOSIS — J45909 Unspecified asthma, uncomplicated: Secondary | ICD-10-CM | POA: Diagnosis not present

## 2023-09-24 DIAGNOSIS — O0990 Supervision of high risk pregnancy, unspecified, unspecified trimester: Secondary | ICD-10-CM | POA: Insufficient documentation

## 2023-09-24 DIAGNOSIS — O09292 Supervision of pregnancy with other poor reproductive or obstetric history, second trimester: Secondary | ICD-10-CM | POA: Diagnosis not present

## 2023-09-24 DIAGNOSIS — O09299 Supervision of pregnancy with other poor reproductive or obstetric history, unspecified trimester: Secondary | ICD-10-CM | POA: Insufficient documentation

## 2023-10-03 ENCOUNTER — Telehealth: Payer: Self-pay | Admitting: Clinical

## 2023-10-03 NOTE — Telephone Encounter (Signed)
Attempt call regarding referral; Unable to leave voicemail as number is not in service.

## 2023-10-09 ENCOUNTER — Other Ambulatory Visit: Payer: Self-pay

## 2023-10-09 ENCOUNTER — Ambulatory Visit (INDEPENDENT_AMBULATORY_CARE_PROVIDER_SITE_OTHER): Payer: Medicaid Other | Admitting: Family Medicine

## 2023-10-09 VITALS — BP 117/70 | HR 85 | Wt 177.2 lb

## 2023-10-09 DIAGNOSIS — O09292 Supervision of pregnancy with other poor reproductive or obstetric history, second trimester: Secondary | ICD-10-CM

## 2023-10-09 DIAGNOSIS — Z98891 History of uterine scar from previous surgery: Secondary | ICD-10-CM

## 2023-10-09 DIAGNOSIS — O0990 Supervision of high risk pregnancy, unspecified, unspecified trimester: Secondary | ICD-10-CM

## 2023-10-09 DIAGNOSIS — Z8759 Personal history of other complications of pregnancy, childbirth and the puerperium: Secondary | ICD-10-CM

## 2023-10-09 DIAGNOSIS — O09299 Supervision of pregnancy with other poor reproductive or obstetric history, unspecified trimester: Secondary | ICD-10-CM

## 2023-10-09 DIAGNOSIS — O0992 Supervision of high risk pregnancy, unspecified, second trimester: Secondary | ICD-10-CM

## 2023-10-09 DIAGNOSIS — Z3A21 21 weeks gestation of pregnancy: Secondary | ICD-10-CM

## 2023-10-09 NOTE — Patient Instructions (Signed)

## 2023-10-09 NOTE — Progress Notes (Signed)
   Subjective:  Stacy Christian is a 32 y.o. [id number] at [redacted]w[redacted]d being seen today for ongoing prenatal care.  She is currently monitored for the following issues for this high-risk pregnancy and has History of premature delivery, currently pregnant; History of classical cesarean section; Hx of preeclampsia, prior pregnancy, currently pregnant; Asthma; History of neonatal death; History of cesarean section; Supervision of high risk pregnancy, antepartum; and History of placenta abruption on their problem list.  Patient reports no complaints.  Contractions: Not present. Vag. Bleeding: None.  Movement: Present. Denies leaking of fluid.   The following portions of the patient's history were reviewed and updated as appropriate: allergies, current medications, past family history, past medical history, past social history, past surgical history and problem list. Problem list updated.  Objective:   Vitals:   10/09/23 1635  BP: 117/70  Pulse: 85  Weight: 177 lb 4 oz (80.4 kg)    Fetal Status: Fetal Heart Rate (bpm): 144   Movement: Present     General:  Alert, oriented and cooperative. Patient is in no acute distress.  Skin: Skin is warm and dry. No rash noted.   Cardiovascular: Normal heart rate noted  Respiratory: Normal respiratory effort, no problems with respiration noted  Abdomen: Soft, gravid, appropriate for gestational age. Pain/Pressure: Absent     Pelvic: Vag. Bleeding: None     Cervical exam deferred        Extremities: Normal range of motion.  Edema: Trace  Mental Status: Normal mood and affect. Normal behavior. Normal judgment and thought content.   Urinalysis:      Assessment and Plan:  Pregnancy: [id number] at [redacted]w[redacted]d  1. Supervision of high risk pregnancy, antepartum BP and FHR normal Up to date on labs  2. Hx of preeclampsia, prior pregnancy, currently pregnant Not yet taking ASA, encouraged to do so ASAP  3. History of placenta abruption Classical cesarean in 2008 at  23 weeks with fetal demise Schedule for RCS at 37 wks  4. History of neonatal death See above  5. History of classical cesarean section See above  Preterm labor symptoms and general obstetric precautions including but not limited to vaginal bleeding, contractions, leaking of fluid and fetal movement were reviewed in detail with the patient. Please refer to After Visit Summary for other counseling recommendations.  Return in 4 weeks (on 11/06/2023) for [facility], ob visit.   Venora Maples, MD

## 2023-10-29 ENCOUNTER — Ambulatory Visit: Payer: Medicaid Other

## 2023-11-06 ENCOUNTER — Encounter: Payer: Medicaid Other | Admitting: Family Medicine

## 2023-11-13 ENCOUNTER — Ambulatory Visit: Payer: Medicaid Other | Admitting: *Deleted

## 2023-11-13 ENCOUNTER — Other Ambulatory Visit: Payer: Self-pay

## 2023-11-13 ENCOUNTER — Ambulatory Visit: Payer: Medicaid Other | Attending: Obstetrics

## 2023-11-13 DIAGNOSIS — J45909 Unspecified asthma, uncomplicated: Secondary | ICD-10-CM | POA: Diagnosis not present

## 2023-11-13 DIAGNOSIS — O34219 Maternal care for unspecified type scar from previous cesarean delivery: Secondary | ICD-10-CM

## 2023-11-13 DIAGNOSIS — O09212 Supervision of pregnancy with history of pre-term labor, second trimester: Secondary | ICD-10-CM

## 2023-11-13 DIAGNOSIS — Z3A26 26 weeks gestation of pregnancy: Secondary | ICD-10-CM | POA: Diagnosis not present

## 2023-11-13 DIAGNOSIS — O09292 Supervision of pregnancy with other poor reproductive or obstetric history, second trimester: Secondary | ICD-10-CM

## 2023-11-13 DIAGNOSIS — O99512 Diseases of the respiratory system complicating pregnancy, second trimester: Secondary | ICD-10-CM

## 2023-11-13 DIAGNOSIS — O402XX0 Polyhydramnios, second trimester, not applicable or unspecified: Secondary | ICD-10-CM

## 2023-11-13 DIAGNOSIS — Z8759 Personal history of other complications of pregnancy, childbirth and the puerperium: Secondary | ICD-10-CM | POA: Diagnosis present

## 2023-11-14 ENCOUNTER — Other Ambulatory Visit: Payer: Self-pay | Admitting: *Deleted

## 2023-11-14 DIAGNOSIS — O09899 Supervision of other high risk pregnancies, unspecified trimester: Secondary | ICD-10-CM

## 2023-11-14 DIAGNOSIS — O09299 Supervision of pregnancy with other poor reproductive or obstetric history, unspecified trimester: Secondary | ICD-10-CM

## 2023-11-15 ENCOUNTER — Encounter (HOSPITAL_COMMUNITY): Payer: Self-pay | Admitting: Obstetrics and Gynecology

## 2023-11-15 ENCOUNTER — Inpatient Hospital Stay (HOSPITAL_COMMUNITY): Payer: Medicaid Other

## 2023-11-15 ENCOUNTER — Inpatient Hospital Stay (HOSPITAL_COMMUNITY)
Admission: AD | Admit: 2023-11-15 | Discharge: 2023-11-15 | Disposition: A | Discharge status: Home / Self Care | Payer: Medicaid Other | Attending: Obstetrics and Gynecology | Admitting: Obstetrics and Gynecology

## 2023-11-15 DIAGNOSIS — O99891 Other specified diseases and conditions complicating pregnancy: Secondary | ICD-10-CM | POA: Diagnosis not present

## 2023-11-15 DIAGNOSIS — O402XX0 Polyhydramnios, second trimester, not applicable or unspecified: Secondary | ICD-10-CM

## 2023-11-15 DIAGNOSIS — O09212 Supervision of pregnancy with history of pre-term labor, second trimester: Secondary | ICD-10-CM

## 2023-11-15 DIAGNOSIS — Z3A26 26 weeks gestation of pregnancy: Secondary | ICD-10-CM

## 2023-11-15 DIAGNOSIS — R030 Elevated blood-pressure reading, without diagnosis of hypertension: Secondary | ICD-10-CM | POA: Diagnosis not present

## 2023-11-15 DIAGNOSIS — R109 Unspecified abdominal pain: Secondary | ICD-10-CM

## 2023-11-15 DIAGNOSIS — O209 Hemorrhage in early pregnancy, unspecified: Secondary | ICD-10-CM | POA: Diagnosis present

## 2023-11-15 DIAGNOSIS — O09299 Supervision of pregnancy with other poor reproductive or obstetric history, unspecified trimester: Secondary | ICD-10-CM

## 2023-11-15 DIAGNOSIS — O99512 Diseases of the respiratory system complicating pregnancy, second trimester: Secondary | ICD-10-CM | POA: Insufficient documentation

## 2023-11-15 DIAGNOSIS — O26892 Other specified pregnancy related conditions, second trimester: Secondary | ICD-10-CM

## 2023-11-15 DIAGNOSIS — O09292 Supervision of pregnancy with other poor reproductive or obstetric history, second trimester: Secondary | ICD-10-CM | POA: Insufficient documentation

## 2023-11-15 DIAGNOSIS — J45909 Unspecified asthma, uncomplicated: Secondary | ICD-10-CM

## 2023-11-15 DIAGNOSIS — O34219 Maternal care for unspecified type scar from previous cesarean delivery: Secondary | ICD-10-CM | POA: Diagnosis not present

## 2023-11-15 DIAGNOSIS — O34212 Maternal care for vertical scar from previous cesarean delivery: Secondary | ICD-10-CM | POA: Diagnosis not present

## 2023-11-15 DIAGNOSIS — N858 Other specified noninflammatory disorders of uterus: Secondary | ICD-10-CM | POA: Diagnosis not present

## 2023-11-15 DIAGNOSIS — R1030 Lower abdominal pain, unspecified: Secondary | ICD-10-CM | POA: Diagnosis present

## 2023-11-15 DIAGNOSIS — R102 Pelvic and perineal pain: Secondary | ICD-10-CM | POA: Diagnosis present

## 2023-11-15 LAB — WET PREP, GENITAL
Clue Cells Wet Prep HPF POC: NONE SEEN
Sperm: NONE SEEN
Trich, Wet Prep: NONE SEEN
WBC, Wet Prep HPF POC: >=10 — AB (ref <10)
Yeast Wet Prep HPF POC: NONE SEEN

## 2023-11-15 LAB — COMPREHENSIVE METABOLIC PANEL
ALT: 9 U/L (ref 0–44)
AST: 14 U/L — ABNORMAL LOW (ref 15–41)
Albumin: 2.8 g/dL — ABNORMAL LOW (ref 3.5–5.0)
Alkaline Phosphatase: 59 U/L (ref 38–126)
Anion gap: 6 (ref 5–15)
BUN: <5 mg/dL — ABNORMAL LOW (ref 6–20)
CO2: 23 mmol/L (ref 22–32)
Calcium: 8.2 mg/dL — ABNORMAL LOW (ref 8.9–10.3)
Chloride: 107 mmol/L (ref 98–111)
Creatinine, Ser: 0.54 mg/dL (ref 0.44–1.00)
GFR, Estimated: >60 mL/min (ref >60)
Glucose, Bld: 77 mg/dL (ref 70–99)
Potassium: 3.5 mmol/L (ref 3.5–5.1)
Sodium: 136 mmol/L (ref 135–145)
Total Bilirubin: 0.4 mg/dL (ref <1.2)
Total Protein: 6.4 g/dL — ABNORMAL LOW (ref 6.5–8.1)

## 2023-11-15 LAB — URINALYSIS, ROUTINE W REFLEX MICROSCOPIC
Bilirubin Urine: NEGATIVE
Glucose, UA: NEGATIVE mg/dL
Hgb urine dipstick: NEGATIVE
Ketones, ur: NEGATIVE mg/dL
Leukocytes,Ua: NEGATIVE
Nitrite: NEGATIVE
Protein, ur: NEGATIVE mg/dL
Specific Gravity, Urine: 1.003 — ABNORMAL LOW (ref 1.005–1.030)
pH: 7 (ref 5.0–8.0)

## 2023-11-15 LAB — PROTEIN / CREATININE RATIO, URINE
Creatinine, Urine: 17 mg/dL
Total Protein, Urine: <6 mg/dL

## 2023-11-15 LAB — CBC
HCT: 29.7 % — ABNORMAL LOW (ref 36.0–46.0)
Hemoglobin: 10.4 g/dL — ABNORMAL LOW (ref 12.0–15.0)
MCH: 30.6 pg (ref 26.0–34.0)
MCHC: 35 g/dL (ref 30.0–36.0)
MCV: 87.4 fL (ref 80.0–100.0)
Platelets: 184 10*3/uL (ref 150–400)
RBC: 3.4 MIL/uL — ABNORMAL LOW (ref 3.87–5.11)
RDW: 12.1 % (ref 11.5–15.5)
WBC: 9.4 10*3/uL (ref 4.0–10.5)
nRBC: 0 % (ref 0.0–0.2)

## 2023-11-15 MED ORDER — ACETAMINOPHEN 500 MG PO TABS
1000.0000 mg | ORAL_TABLET | Freq: Once | ORAL | Status: AC
  Administered 2023-11-15: 1000 mg via ORAL
  Filled 2023-11-15: qty 2

## 2023-11-15 NOTE — MAU Provider Note (Signed)
History     CSN: [id number]  Arrival date and time: 11/15/23 1317   Event Date/Time   First Provider Initiated Contact with Patient 11/15/23 1430      Chief Complaint  Patient presents with   Contractions   Vaginal Bleeding   HPI  Ms.Stacy Christian is a 32 y.o. female [phone] @ [redacted]w[redacted]d here in MAU with abdominal pain that started yesterday. She does attest to having sex last night, prior to the pain starting. She reports lower abdominal pain that radiates around to her lower back. She repots scant, pink vaginal discharge. Reports good fetal movement.  Rates her pain 6/10. Has not tried anything for the pain.   Poor OB history including classical C/s along with 3 total C/s, along with preeclampsia.   Diagnosed with GHTN in her last pregnancy at 27 weeks. She delivered via repeat C/s @ [redacted]w[redacted]d 2/2 labor.   OB History     Gravida  5   Para  3   Term  0   Preterm  3   AB  1   Living  2      SAB  1   IAB  0   Ectopic  0   Multiple  0   Live Births  3           Past Medical History:  Diagnosis Date   Asthma    Preeclampsia    Preterm labor    Supervision of high risk pregnancy, antepartum 07/09/2019              Nursing Staff    Provider      Office Location     [facility]    Dating     LMP + 8 wk [place]      Language      English    Anatomy US     WNL--f/u at 28 wks      Flu Vaccine     08/06/2019    Genetic Screen     NIPS:  WNL           TDaP vaccine      10/15/19    Hgb A1C or   GTT    Early 5.0 A1c  Third trimester: 68/100/81      Rhogam     n/a         LAB RESULTS       Feeding Plan    Breast        Past Surgical History:  Procedure Laterality Date   CESAREAN SECTION     CESAREAN SECTION N/A 11/16/2019   Procedure: CESAREAN SECTION;  Surgeon: Reva Bores, MD;  Location: [facility] LD [facility]S;  Service: Obstetrics;  Laterality: N/A;   TONSILLECTOMY      Family History  Problem Relation Age of Onset   Heart disease Mother    Diabetes Mother    Hypertension  Mother    Diabetes Father    Hypertension Father    Heart disease Father    Cancer Maternal Grandmother    Diabetes Maternal Grandmother    Cancer Maternal Grandfather    Heart disease Paternal Grandmother    Heart disease Paternal Grandfather     Social History   Tobacco Use   Smoking status: Light Smoker    Current packs/day: 0.00    Types: Cigarettes    Last attempt to quit: 05/23/2019    Years since quitting: 4.4   Smokeless tobacco: Never  Vaping Use  Vaping status: Never Used  Substance Use Topics   Alcohol use: Not Currently    Comment: occasionally   Drug use: Not Currently    Types: Marijuana    Comment: working on decreasing    Allergies: No Known Allergies  No medications prior to admission.   Results for orders placed or performed during the hospital encounter of 11/15/23 (from the past 48 hours)  Urinalysis, Routine w reflex microscopic -Urine, Clean Catch     Status: Abnormal   Collection Time: 11/15/23  1:54 PM  Result Value Ref Range   Color, Urine COLORLESS (A) YELLOW   APPearance CLEAR CLEAR   Specific Gravity, Urine 1.003 (L) 1.005 - 1.030   pH 7.0 5.0 - 8.0   Glucose, UA NEGATIVE NEGATIVE mg/dL   Hgb urine dipstick NEGATIVE NEGATIVE   Bilirubin Urine NEGATIVE NEGATIVE   Ketones, ur NEGATIVE NEGATIVE mg/dL   Protein, ur NEGATIVE NEGATIVE mg/dL   Nitrite NEGATIVE NEGATIVE   Leukocytes,Ua NEGATIVE NEGATIVE    Comment: Performed at [facility], [address], [place], [place] [address]  Protein / creatinine ratio, urine     Status: None   Collection Time: 11/15/23  1:54 PM  Result Value Ref Range   Creatinine, Urine 17 mg/dL   Total Protein, Urine <6 mg/dL    Comment: NO NORMAL RANGE ESTABLISHED FOR THIS TEST   Protein Creatinine Ratio        0.00 - 0.15 mg/mg[Cre]    Comment: RESULT BELOW REPORTABLE RANGE, UNABLE TO CALCULATE. Performed at [facility], [address] [place], [place] [address]   Wet prep, genital      Status: Abnormal   Collection Time: 11/15/23  3:05 PM   Specimen: Vaginal  Result Value Ref Range   Yeast Wet Prep HPF POC NONE SEEN NONE SEEN   Trich, Wet Prep NONE SEEN NONE SEEN   Clue Cells Wet Prep HPF POC NONE SEEN NONE SEEN   WBC, Wet Prep HPF POC >=10 (A) <10   Sperm NONE SEEN     Comment: Performed at [facility], [address], [place], [place] [address]  CBC     Status: Abnormal   Collection Time: 11/15/23  3:47 PM  Result Value Ref Range   WBC 9.4 4.0 - 10.5 K/uL   RBC 3.40 (L) 3.87 - 5.11 MIL/uL   Hemoglobin 10.4 (L) 12.0 - 15.0 g/dL   HCT 38.1 (L) 01.7 - 51.0 %   MCV 87.4 80.0 - 100.0 fL   MCH 30.6 26.0 - 34.0 pg   MCHC 35.0 30.0 - 36.0 g/dL   RDW 25.8 52.7 - 78.2 %   Platelets 184 150 - 400 K/uL   nRBC 0.0 0.0 - 0.2 %    Comment: Performed at [facility], [address], [place], [place] [address]  Comprehensive metabolic panel     Status: Abnormal   Collection Time: 11/15/23  3:47 PM  Result Value Ref Range   Sodium 136 135 - 145 mmol/L   Potassium 3.5 3.5 - 5.1 mmol/L   Chloride 107 98 - 111 mmol/L   CO2 23 22 - 32 mmol/L   Glucose, Bld 77 70 - 99 mg/dL    Comment: Glucose reference range applies only to samples taken after fasting for at least 8 hours.   BUN <5 (L) 6 - 20 mg/dL   Creatinine, Ser 6.14 0.44 - 1.00 mg/dL   Calcium 8.2 (L) 8.9 - 10.3  mg/dL   Total Protein 6.4 (L) 6.5 - 8.1 g/dL   Albumin 2.8 (L) 3.5 - 5.0 g/dL   AST 14 (L) 15 - 41 U/L   ALT 9 0 - 44 U/L   Alkaline Phosphatase 59 38 - 126 U/L   Total Bilirubin 0.4 <1.2 mg/dL   GFR, Estimated >96 >04 mL/min    Comment: (NOTE) Calculated using the CKD-EPI Creatinine Equation (2021)    Anion gap 6 5 - 15    Comment: Performed at [facility], [address] [place], [place] [address]     Review of Systems  Constitutional:  Negative for fever.  Gastrointestinal:  Positive for abdominal pain.  Genitourinary:  Positive for vaginal discharge.   Physical Exam   Blood  pressure 137/86, pulse 76, temperature 98.3 F (36.8 C), resp. rate 18, height 5\' 6"  (1.676 m), weight 85.3 kg, last menstrual period 05/12/2023, SpO2 100%, unknown if currently breastfeeding.  Patient Vitals for the past 24 hrs:  BP Temp Pulse Resp SpO2 Height Weight  11/15/23 1640 137/86 -- 76 -- 100 % -- --  11/15/23 1535 -- -- -- -- 99 % -- --  11/15/23 1531 127/87 -- 80 -- -- -- --  11/15/23 1530 -- -- -- -- 100 % -- --  11/15/23 1525 -- -- -- -- 100 % -- --  11/15/23 1520 -- -- -- -- 100 % -- --  11/15/23 1516 128/87 -- 73 -- -- -- --  11/15/23 1515 -- -- -- -- 100 % -- --  11/15/23 1510 -- -- -- -- 100 % -- --  11/15/23 1505 131/85 -- 69 -- 100 % -- --  11/15/23 1500 -- -- -- -- 98 % -- --  11/15/23 1455 -- -- -- -- 99 % -- --  11/15/23 1450 -- -- -- -- 99 % -- --  11/15/23 1445 -- -- -- -- 99 % -- --  11/15/23 1440 -- -- -- -- 99 % -- --  11/15/23 1435 -- -- -- -- 99 % -- --  11/15/23 1430 -- -- -- -- 100 % -- --  11/15/23 1425 -- -- -- -- 100 % -- --  11/15/23 1420 -- -- -- -- 99 % -- --  11/15/23 1415 -- -- -- -- 99 % -- --  11/15/23 1410 -- -- -- -- 99 % -- --  11/15/23 1405 -- -- -- -- 99 % -- --  11/15/23 1400 135/88 -- 87 -- -- -- --  11/15/23 1335 (!) 140/91 98.3 F (36.8 C) 89 18 -- 5\' 6"  (1.676 m) 85.3 kg    Physical Exam Constitutional:      General: She is not in acute distress.    Appearance: Normal appearance. She is not ill-appearing, toxic-appearing or diaphoretic.  Abdominal:     Palpations: Abdomen is soft.  Genitourinary:    Comments: Dilation: Closed, thick, posterior. Exam by:: Valdez Brannan,NP  Skin:    General: Skin is warm.  Neurological:     Mental Status: She is alert and oriented to person, place, and time.   Fetal Tracing: Baseline: 125 bpm Variability: Moderate  Accelerations: 15x15 Decelerations: None Toco: None  MAU Course  Procedures  MDM  Intake BP 140/91 then repeat 135/88 PIH labs reassuring Tylenol given 1 gram, pain  0/10 Limited US with subjectively increased fluid. She has a follow up US scheduled on 12/11/23   Assessment and Plan   A:  1. Hx of preeclampsia, prior pregnancy, currently pregnant  2. Elevated BP without diagnosis of hypertension   3. [redacted] weeks gestation of pregnancy   4. Abdominal pain during pregnancy in second trimester      P:  Dc home in stable condition Needs BP check in the office next week, message sent to the [facility] office. Preeclampsia precautions reviewed Pelvic rest Ok to use tylenol as needed.  Increase oral fluid intake.  Return if symptoms worsen  Avram Danielson, Harolyn Rutherford, NP 11/15/2023 5:39 PM

## 2023-11-15 NOTE — MAU Note (Signed)
.  Stacy Christian is a 32 y.o. at [redacted]w[redacted]d here in MAU reporting: she had som light pink blood this morning when she wiped.  Went to work and started having some tightening and cramping. . Good fetal movement felt throughout the day.  P reports she did have intercourse last night.  LMP:  Onset of complaint: this morning Pain score: 6-7 Vitals:   11/15/23 1335  BP: (!) 140/91  Pulse: 89  Resp: 18  Temp: 98.3 F (36.8 C)     FHT:145 Lab orders placed from triage:  u/a

## 2023-11-16 LAB — GC/CHLAMYDIA PROBE AMP (~~LOC~~) NOT AT ARMC
Chlamydia: NEGATIVE
Comment: NEGATIVE
Comment: NORMAL
Neisseria Gonorrhea: NEGATIVE

## 2023-11-19 ENCOUNTER — Ambulatory Visit (INDEPENDENT_AMBULATORY_CARE_PROVIDER_SITE_OTHER): Payer: Medicaid Other | Admitting: Advanced Practice Midwife

## 2023-11-19 ENCOUNTER — Encounter: Payer: Self-pay | Admitting: Family Medicine

## 2023-11-19 VITALS — BP 125/82 | HR 82 | Wt 191.5 lb

## 2023-11-19 DIAGNOSIS — O0990 Supervision of high risk pregnancy, unspecified, unspecified trimester: Secondary | ICD-10-CM

## 2023-11-19 DIAGNOSIS — O09292 Supervision of pregnancy with other poor reproductive or obstetric history, second trimester: Secondary | ICD-10-CM

## 2023-11-19 DIAGNOSIS — O133 Gestational [pregnancy-induced] hypertension without significant proteinuria, third trimester: Secondary | ICD-10-CM

## 2023-11-19 DIAGNOSIS — Z98891 History of uterine scar from previous surgery: Secondary | ICD-10-CM

## 2023-11-19 DIAGNOSIS — O34219 Maternal care for unspecified type scar from previous cesarean delivery: Secondary | ICD-10-CM

## 2023-11-19 DIAGNOSIS — Z8759 Personal history of other complications of pregnancy, childbirth and the puerperium: Secondary | ICD-10-CM | POA: Insufficient documentation

## 2023-11-19 DIAGNOSIS — Z3A27 27 weeks gestation of pregnancy: Secondary | ICD-10-CM

## 2023-11-19 DIAGNOSIS — O132 Gestational [pregnancy-induced] hypertension without significant proteinuria, second trimester: Secondary | ICD-10-CM

## 2023-11-19 DIAGNOSIS — O09299 Supervision of pregnancy with other poor reproductive or obstetric history, unspecified trimester: Secondary | ICD-10-CM

## 2023-11-19 NOTE — Patient Instructions (Signed)
TDaP Vaccine Pregnancy Get the Whooping Cough Vaccine While You Are Pregnant (CDC)  It is important for women to get the whooping cough vaccine in the third trimester of each pregnancy. Vaccines are the best way to prevent this disease. There are 2 different whooping cough vaccines. Both vaccines combine protection against whooping cough, tetanus and diphtheria, but they are for different age groups: Tdap: for everyone 11 years or older, including pregnant women  DTaP: for children 2 months through 6 years of age  You need the whooping cough vaccine during each of your pregnancies The recommended time to get the shot is during your 27th through 36th week of pregnancy, preferably during the earlier part of this time period. The Centers for Disease Control and Prevention (CDC) recommends that pregnant women receive the whooping cough vaccine for adolescents and adults (called Tdap vaccine) during the third trimester of each pregnancy. The recommended time to get the shot is during your 27th through 36th week of pregnancy, preferably during the earlier part of this time period. This replaces the original recommendation that pregnant women get the vaccine only if they had not previously received it. The American College of Obstetricians and Gynecologists and the American College of Nurse-Midwives support this recommendation.  You should get the whooping cough vaccine while pregnant to pass protection to your baby frame support disabled and/or not supported in this browser  Learn why Stacy Christian decided to get the whooping cough vaccine in her 3rd trimester of pregnancy and how her baby girl was born with some protection against the disease. Also available on YouTube. After receiving the whooping cough vaccine, your body will create protective antibodies (proteins produced by the body to fight off diseases) and pass some of them to your baby before birth. These antibodies provide your baby some short-term  protection against whooping cough in early life. These antibodies can also protect your baby from some of the more serious complications that come along with whooping cough. Your protective antibodies are at their highest about 2 weeks after getting the vaccine, but it takes time to pass them to your baby. So the preferred time to get the whooping cough vaccine is early in your third trimester. The amount of whooping cough antibodies in your body decreases over time. That is why CDC recommends you get a whooping cough vaccine during each pregnancy. Doing so allows each of your babies to get the greatest number of protective antibodies from you. This means each of your babies will get the best protection possible against this disease.  Getting the whooping cough vaccine while pregnant is better than getting the vaccine after you give birth Whooping cough vaccination during pregnancy is ideal so your baby will have short-term protection as soon as he is born. This early protection is important because your baby will not start getting his whooping cough vaccines until he is 2 months old. These first few months of life are when your baby is at greatest risk for catching whooping cough. This is also when he's at greatest risk for having severe, potentially life-threating complications from the infection. To avoid that gap in protection, it is best to get a whooping cough vaccine during pregnancy. You will then pass protection to your baby before he is born. To continue protecting your baby, he should get whooping cough vaccines starting at 2 months old. You may never have gotten the Tdap vaccine before and did not get it during this pregnancy. If so, you should make sure   to get the vaccine immediately after you give birth, before leaving the hospital or birthing center. It will take about 2 weeks before your body develops protection (antibodies) in response to the vaccine. Once you have protection from the vaccine,  you are less likely to give whooping cough to your newborn while caring for him. But remember, your baby will still be at risk for catching whooping cough from others. A recent study looked to see how effective Tdap was at preventing whooping cough in babies whose mothers got the vaccine while pregnant or in the hospital after giving birth. The study found that getting Tdap between 27 through 36 weeks of pregnancy is 85% more effective at preventing whooping cough in babies younger than 2 months old. Blood tests cannot tell if you need a whooping cough vaccine There are no blood tests that can tell you if you have enough antibodies in your body to protect yourself or your baby against whooping cough. Even if you have been sick with whooping cough in the past or previously received the vaccine, you still should get the vaccine during each pregnancy. Breastfeeding may pass some protective antibodies onto your baby By breastfeeding, you may pass some antibodies you have made in response to the vaccine to your baby. When you get a whooping cough vaccine during your pregnancy, you will have antibodies in your breast milk that you can share with your baby as soon as your milk comes in. However, your baby will not get protective antibodies immediately if you wait to get the whooping cough vaccine until after delivering your baby. This is because it takes about 2 weeks for your body to create antibodies. Learn more about the health benefits of breastfeeding.  

## 2023-11-19 NOTE — Progress Notes (Signed)
   PRENATAL VISIT NOTE  Subjective:  Stacy Christian is a 32 y.o. [id number] at [redacted]w[redacted]d being seen today for ongoing prenatal care.  She is currently monitored for the following issues for this high-risk pregnancy and has History of premature delivery, currently pregnant; History of classical cesarean section; Hx of preeclampsia, prior pregnancy, currently pregnant; Asthma; History of neonatal death; History of cesarean section; Supervision of high risk pregnancy, antepartum; and History of placenta abruption on their problem list.  Patient reports no complaints.  Contractions: Irritability. Vag. Bleeding: Scant.  Movement: Present. Denies leaking of fluid.   The following portions of the patient's history were reviewed and updated as appropriate: allergies, current medications, past family history, past medical history, past social history, past surgical history and problem list.   Objective:   Vitals:   11/19/23 0849 11/19/23 0920  BP: (!) 135/92 125/82  Pulse: 80 82  Weight: 191 lb 8 oz (86.9 kg)     Fetal Status: Fetal Heart Rate (bpm): 149   Movement: Present     General:  Alert, oriented and cooperative. Patient is in no acute distress.  Skin: Skin is warm and dry. No rash noted.   Cardiovascular: Normal heart rate noted  Respiratory: Normal respiratory effort, no problems with respiration noted  Abdomen: Soft, gravid, appropriate for gestational age.  Pain/Pressure: Present     Pelvic: Cervical exam deferred        Extremities: Normal range of motion.  Edema: None  Mental Status: Normal mood and affect. Normal behavior. Normal judgment and thought content.   Assessment and Plan:  Pregnancy: [id number] at [redacted]w[redacted]d 1. Supervision of high risk pregnancy, antepartum (Primary) - 28 week labs NV - TDaP info given - CBC; Future - Glucose Tolerance, 2 Hours w/1 Hour; Future - HIV Antibody (routine testing w rflx); Future - RPR; Future  2. [redacted] weeks gestation of pregnancy   3. Hx of  preeclampsia, prior pregnancy, currently pregnant - Nml BP  4. History of classical cesarean section - Plan 37 week repeat C/S. - Referral placed  5. Hx fGR - Growth [place] scheduled  6. Gestational Hypertension - repeat BP Nml. No Pre-E Sx. Precautions reviewed. Pre- E labs Nml 12/12. Will not repeat today.  - Start antenatal testing per MFM.   Preterm labor symptoms and general obstetric precautions including but not limited to vaginal bleeding, contractions, leaking of fluid and fetal movement were reviewed in detail with the patient. Please refer to After Visit Summary for other counseling recommendations.   No follow-ups on file.  Future Appointments  Date Time Provider Department Center  11/30/2023  8:15 AM Warden Fillers, MD [facility]  11/30/2023  8:50 AM [facility]  12/11/2023  2:30 PM [facility] US1 [facility]    Dorathy Kinsman, CNM

## 2023-11-20 NOTE — Addendum Note (Signed)
Addended by: Maxwell Marion E on: 11/20/2023 11:03 AM   Modules accepted: Orders

## 2023-11-30 ENCOUNTER — Other Ambulatory Visit: Payer: Medicaid Other

## 2023-11-30 ENCOUNTER — Other Ambulatory Visit: Payer: Self-pay

## 2023-11-30 ENCOUNTER — Ambulatory Visit: Payer: Medicaid Other | Admitting: Obstetrics and Gynecology

## 2023-11-30 VITALS — BP 134/84 | HR 80 | Wt 192.0 lb

## 2023-11-30 DIAGNOSIS — O09293 Supervision of pregnancy with other poor reproductive or obstetric history, third trimester: Secondary | ICD-10-CM

## 2023-11-30 DIAGNOSIS — Z3A28 28 weeks gestation of pregnancy: Secondary | ICD-10-CM

## 2023-11-30 DIAGNOSIS — O09899 Supervision of other high risk pregnancies, unspecified trimester: Secondary | ICD-10-CM

## 2023-11-30 DIAGNOSIS — O09893 Supervision of other high risk pregnancies, third trimester: Secondary | ICD-10-CM

## 2023-11-30 DIAGNOSIS — O133 Gestational [pregnancy-induced] hypertension without significant proteinuria, third trimester: Secondary | ICD-10-CM

## 2023-11-30 DIAGNOSIS — Z8759 Personal history of other complications of pregnancy, childbirth and the puerperium: Secondary | ICD-10-CM

## 2023-11-30 DIAGNOSIS — O0990 Supervision of high risk pregnancy, unspecified, unspecified trimester: Secondary | ICD-10-CM

## 2023-11-30 DIAGNOSIS — O09299 Supervision of pregnancy with other poor reproductive or obstetric history, unspecified trimester: Secondary | ICD-10-CM

## 2023-11-30 DIAGNOSIS — O0993 Supervision of high risk pregnancy, unspecified, third trimester: Secondary | ICD-10-CM

## 2023-11-30 DIAGNOSIS — Z98891 History of uterine scar from previous surgery: Secondary | ICD-10-CM

## 2023-11-30 DIAGNOSIS — Z23 Encounter for immunization: Secondary | ICD-10-CM

## 2023-11-30 NOTE — Progress Notes (Signed)
   PRENATAL VISIT NOTE  Subjective:  Stacy Christian is a 32 y.o. [id number] at [redacted]w[redacted]d being seen today for ongoing prenatal care.  She is currently monitored for the following issues for this high-risk pregnancy and has History of premature delivery, currently pregnant; History of classical cesarean section; Hx of preeclampsia, prior pregnancy, currently pregnant; Asthma; History of neonatal death; Gestational hypertension; History of cesarean section; Supervision of high risk pregnancy, antepartum; History of placenta abruption; and History of prior pregnancy with intrauterine growth restricted newborn on their problem list.  Patient doing well with no acute concerns today. She reports  occasional post coital spotting .  Contractions: Not present. Vag. Bleeding: None.  Movement: Present. Denies leaking of fluid.   The following portions of the patient's history were reviewed and updated as appropriate: allergies, current medications, past family history, past medical history, past social history, past surgical history and problem list. Problem list updated.  Objective:   Vitals:   11/30/23 0834  BP: 134/84  Pulse: 80  Weight: 192 lb (87.1 kg)    Fetal Status: Fetal Heart Rate (bpm): 138 Fundal Height: 29 cm Movement: Present     General:  Alert, oriented and cooperative. Patient is in no acute distress.  Skin: Skin is warm and dry. No rash noted.   Cardiovascular: Normal heart rate noted  Respiratory: Normal respiratory effort, no problems with respiration noted  Abdomen: Soft, gravid, appropriate for gestational age.  Pain/Pressure: Absent     Pelvic: Cervical exam deferred        Extremities: Normal range of motion.  Edema: Trace  Mental Status:  Normal mood and affect. Normal behavior. Normal judgment and thought content.   Assessment and Plan:  Pregnancy: [id number] at [redacted]w[redacted]d  1. Supervision of high risk pregnancy, antepartum (Primary) Continue routine prenatal care Discussed  postcoital spotting and how it can be fairly common in a normal pregnancy Pt ate this morning and will reschedule her labs until 12/06/23  2. [redacted] weeks gestation of pregnancy   3. Gestational hypertension, third trimester Pt compliant with baby ASA BP borderline but normal  4. History of placenta abruption   5. History of classical cesarean section Repeat c section at 37 weeks Long discussion with patient regarding high order c sections and increased risk of complications. Pt is unsure regarding BTL.  Discussed permanency of procedure and logistics of medicaid paperwork.  Pt will try to have a decision by next visit  6. History of premature delivery, currently pregnant No s/sx of preterm labor  7. History of prior pregnancy with intrauterine growth restricted newborn Pt has several growth scans  and BPP scheduled Last EFW was 18%  8. Hx of preeclampsia, prior pregnancy, currently pregnant No s/sx of preeclampsia  Preterm labor symptoms and general obstetric precautions including but not limited to vaginal bleeding, contractions, leaking of fluid and fetal movement were reviewed in detail with the patient.  Please refer to After Visit Summary for other counseling recommendations.   Return in about 2 weeks (around 12/14/2023) for [facility], in person.   Mariel Aloe, MD Faculty Attending Center for [facility]

## 2023-12-04 ENCOUNTER — Ambulatory Visit (INDEPENDENT_AMBULATORY_CARE_PROVIDER_SITE_OTHER): Payer: Medicaid Other

## 2023-12-04 ENCOUNTER — Other Ambulatory Visit: Payer: Medicaid Other

## 2023-12-04 VITALS — BP 135/86 | HR 81 | Wt 190.4 lb

## 2023-12-04 DIAGNOSIS — O0990 Supervision of high risk pregnancy, unspecified, unspecified trimester: Secondary | ICD-10-CM

## 2023-12-04 DIAGNOSIS — O133 Gestational [pregnancy-induced] hypertension without significant proteinuria, third trimester: Secondary | ICD-10-CM | POA: Diagnosis not present

## 2023-12-04 DIAGNOSIS — Z3A29 29 weeks gestation of pregnancy: Secondary | ICD-10-CM | POA: Diagnosis not present

## 2023-12-06 ENCOUNTER — Other Ambulatory Visit: Payer: Self-pay | Admitting: *Deleted

## 2023-12-06 ENCOUNTER — Other Ambulatory Visit: Payer: Medicaid Other

## 2023-12-06 ENCOUNTER — Other Ambulatory Visit: Payer: Self-pay

## 2023-12-06 DIAGNOSIS — O133 Gestational [pregnancy-induced] hypertension without significant proteinuria, third trimester: Secondary | ICD-10-CM | POA: Diagnosis not present

## 2023-12-06 DIAGNOSIS — Z8759 Personal history of other complications of pregnancy, childbirth and the puerperium: Secondary | ICD-10-CM

## 2023-12-06 DIAGNOSIS — O0990 Supervision of high risk pregnancy, unspecified, unspecified trimester: Secondary | ICD-10-CM

## 2023-12-06 DIAGNOSIS — R030 Elevated blood-pressure reading, without diagnosis of hypertension: Secondary | ICD-10-CM

## 2023-12-06 DIAGNOSIS — Z3A29 29 weeks gestation of pregnancy: Secondary | ICD-10-CM

## 2023-12-10 ENCOUNTER — Other Ambulatory Visit: Payer: Self-pay

## 2023-12-10 DIAGNOSIS — O0990 Supervision of high risk pregnancy, unspecified, unspecified trimester: Secondary | ICD-10-CM

## 2023-12-11 ENCOUNTER — Encounter: Payer: Self-pay | Admitting: Advanced Practice Midwife

## 2023-12-11 ENCOUNTER — Other Ambulatory Visit: Payer: Self-pay | Admitting: *Deleted

## 2023-12-11 ENCOUNTER — Encounter: Payer: Self-pay | Admitting: Family Medicine

## 2023-12-11 ENCOUNTER — Ambulatory Visit: Payer: Medicaid Other | Attending: Maternal & Fetal Medicine

## 2023-12-11 ENCOUNTER — Other Ambulatory Visit: Payer: Medicaid Other

## 2023-12-11 ENCOUNTER — Ambulatory Visit: Payer: Medicaid Other | Admitting: Family Medicine

## 2023-12-11 ENCOUNTER — Other Ambulatory Visit: Payer: Self-pay | Admitting: Advanced Practice Midwife

## 2023-12-11 VITALS — BP 145/89 | HR 76 | Wt 191.5 lb

## 2023-12-11 DIAGNOSIS — O403XX0 Polyhydramnios, third trimester, not applicable or unspecified: Secondary | ICD-10-CM | POA: Insufficient documentation

## 2023-12-11 DIAGNOSIS — O34219 Maternal care for unspecified type scar from previous cesarean delivery: Secondary | ICD-10-CM | POA: Diagnosis not present

## 2023-12-11 DIAGNOSIS — O09213 Supervision of pregnancy with history of pre-term labor, third trimester: Secondary | ICD-10-CM

## 2023-12-11 DIAGNOSIS — O133 Gestational [pregnancy-induced] hypertension without significant proteinuria, third trimester: Secondary | ICD-10-CM

## 2023-12-11 DIAGNOSIS — Z8759 Personal history of other complications of pregnancy, childbirth and the puerperium: Secondary | ICD-10-CM

## 2023-12-11 DIAGNOSIS — O09299 Supervision of pregnancy with other poor reproductive or obstetric history, unspecified trimester: Secondary | ICD-10-CM | POA: Insufficient documentation

## 2023-12-11 DIAGNOSIS — Z3A30 30 weeks gestation of pregnancy: Secondary | ICD-10-CM | POA: Diagnosis not present

## 2023-12-11 DIAGNOSIS — O0990 Supervision of high risk pregnancy, unspecified, unspecified trimester: Secondary | ICD-10-CM

## 2023-12-11 DIAGNOSIS — O09293 Supervision of pregnancy with other poor reproductive or obstetric history, third trimester: Secondary | ICD-10-CM

## 2023-12-11 DIAGNOSIS — J45909 Unspecified asthma, uncomplicated: Secondary | ICD-10-CM

## 2023-12-11 DIAGNOSIS — O09899 Supervision of other high risk pregnancies, unspecified trimester: Secondary | ICD-10-CM | POA: Insufficient documentation

## 2023-12-11 DIAGNOSIS — O0993 Supervision of high risk pregnancy, unspecified, third trimester: Secondary | ICD-10-CM

## 2023-12-11 DIAGNOSIS — Z3A27 27 weeks gestation of pregnancy: Secondary | ICD-10-CM

## 2023-12-11 DIAGNOSIS — Z98891 History of uterine scar from previous surgery: Secondary | ICD-10-CM

## 2023-12-11 DIAGNOSIS — O99513 Diseases of the respiratory system complicating pregnancy, third trimester: Secondary | ICD-10-CM

## 2023-12-11 MED ORDER — NIFEDIPINE ER OSMOTIC RELEASE 30 MG PO TB24: 30.0000 mg | ORAL_TABLET | Freq: Every day | ORAL | 5 refills | Status: DC

## 2023-12-11 NOTE — Patient Instructions (Signed)

## 2023-12-11 NOTE — Progress Notes (Signed)
   Subjective:  Stacy Christian is a 33 y.o. [id number] at [redacted]w[redacted]d being seen today for ongoing prenatal care.  She is currently monitored for the following issues for this high-risk pregnancy and has History of premature delivery, currently pregnant; History of classical cesarean section; Hx of preeclampsia, prior pregnancy, currently pregnant; Asthma; History of neonatal death; Gestational hypertension; History of cesarean section; Supervision of high risk pregnancy, antepartum; History of placenta abruption; and History of prior pregnancy with intrauterine growth restricted newborn on their problem list.  Patient reports no complaints.  Contractions: Not present. Vag. Bleeding: None.  Movement: Present. Denies leaking of fluid.   The following portions of the patient's history were reviewed and updated as appropriate: allergies, current medications, past family history, past medical history, past social history, past surgical history and problem list. Problem list updated.  Objective:   Vitals:   12/11/23 1046 12/11/23 1101  BP: (!) 144/93 (!) 145/89  Pulse: 80 76  Weight: 191 lb 8 oz (86.9 kg)     Fetal Status: Fetal Heart Rate (bpm): 140   Movement: Present     General:  Alert, oriented and cooperative. Patient is in no acute distress.  Skin: Skin is warm and dry. No rash noted.   Cardiovascular: Normal heart rate noted  Respiratory: Normal respiratory effort, no problems with respiration noted  Abdomen: Soft, gravid, appropriate for gestational age. Pain/Pressure: Absent     Pelvic: Vag. Bleeding: None     Cervical exam deferred        Extremities: Normal range of motion.  Edema: None  Mental Status: Normal mood and affect. Normal behavior. Normal judgment and thought content.   Urinalysis:      Assessment and Plan:  Pregnancy: [id number] at [redacted]w[redacted]d  1. Supervision of high risk pregnancy, antepartum (Primary) BP elevated, see below FHR normal Doing third trimester labs  today Discussed contraception at length, she thinks she wants to have one more child and is for sure that she does not want a BTL Strongly recommended against future pregnancies and explained reasons why Interested in nexplanon , planning to do that postpartum, PL updated  2. Gestational hypertension, third trimester BP elevated x2 today, has not had weekly testing likely due to difficulties over the holidays Start Nifedipine  30 XL CBC, CMP, UPCR today Schedule for weekly visits with labs and antenatal testing until delivery which is already scheduled at 37 weeks Impressed upon her the importance of this visit cadence to identify problems early and avoid bad outcomes Has MFM US  scheduled for later today - Comp Met (CMET) - Protein / creatinine ratio, urine  3. History of classical cesarean section Scheduled for RCS at 37 weeks  4. History of neonatal death Abruption at 23 weeks with severe PreE and classical cesarean  5. Hx of preeclampsia, prior pregnancy, currently pregnant On ASA  Preterm labor symptoms and general obstetric precautions including but not limited to vaginal bleeding, contractions, leaking of fluid and fetal movement were reviewed in detail with the patient. Please refer to After Visit Summary for other counseling recommendations.  Return in 2 weeks (on 12/25/2023) for [facility], ob visit, needs MD.   Lola Donnice HERO, MD

## 2023-12-12 LAB — COMPREHENSIVE METABOLIC PANEL
ALT: 11 [IU]/L (ref 0–32)
AST: 18 [IU]/L (ref 0–40)
Albumin: 3.6 g/dL — ABNORMAL LOW (ref 3.9–4.9)
Alkaline Phosphatase: 86 [IU]/L (ref 44–121)
BUN/Creatinine Ratio: 8 — ABNORMAL LOW (ref 9–23)
BUN: 4 mg/dL — ABNORMAL LOW (ref 6–20)
Bilirubin Total: <0.2 mg/dL (ref 0.0–1.2)
CO2: 22 mmol/L (ref 20–29)
Calcium: 8.4 mg/dL — ABNORMAL LOW (ref 8.7–10.2)
Chloride: 103 mmol/L (ref 96–106)
Creatinine, Ser: 0.5 mg/dL — ABNORMAL LOW (ref 0.57–1.00)
Globulin, Total: 2.9 g/dL (ref 1.5–4.5)
Glucose: 62 mg/dL — ABNORMAL LOW (ref 70–99)
Potassium: 3.8 mmol/L (ref 3.5–5.2)
Sodium: 138 mmol/L (ref 134–144)
Total Protein: 6.5 g/dL (ref 6.0–8.5)
eGFR: 128 mL/min/{1.73_m2} (ref >59)

## 2023-12-12 LAB — GLUCOSE TOLERANCE, 2 HOURS W/ 1HR
Glucose, 1 hour: 62 mg/dL — ABNORMAL LOW (ref 70–179)
Glucose, 2 hour: 56 mg/dL — ABNORMAL LOW (ref 70–152)
Glucose, Fasting: 65 mg/dL — ABNORMAL LOW (ref 70–91)

## 2023-12-12 LAB — CBC
Hematocrit: 31.9 % — ABNORMAL LOW (ref 34.0–46.6)
Hemoglobin: 10.8 g/dL — ABNORMAL LOW (ref 11.1–15.9)
MCH: 30.3 pg (ref 26.6–33.0)
MCHC: 33.9 g/dL (ref 31.5–35.7)
MCV: 90 fL (ref 79–97)
Platelets: 193 x10E3/uL (ref 150–450)
RBC: 3.56 x10E6/uL — ABNORMAL LOW (ref 3.77–5.28)
RDW: 12 % (ref 11.7–15.4)
WBC: 7.8 x10E3/uL (ref 3.4–10.8)

## 2023-12-12 LAB — SYPHILIS: RPR W/REFLEX TO RPR TITER AND TREPONEMAL ANTIBODIES, TRADITIONAL SCREENING AND DIAGNOSIS ALGORITHM: RPR Ser Ql: NONREACTIVE

## 2023-12-12 LAB — HIV ANTIBODY (ROUTINE TESTING W REFLEX): HIV Screen 4th Generation wRfx: NONREACTIVE

## 2023-12-12 LAB — PROTEIN / CREATININE RATIO, URINE
Creatinine, Urine: 98.7 mg/dL
Protein, Ur: 10 mg/dL
Protein/Creat Ratio: 101 mg/g{creat} (ref 0–200)

## 2023-12-17 ENCOUNTER — Encounter: Payer: Self-pay | Admitting: Family Medicine

## 2023-12-18 ENCOUNTER — Ambulatory Visit: Payer: Medicaid Other

## 2023-12-18 ENCOUNTER — Other Ambulatory Visit: Payer: Medicaid Other

## 2023-12-21 ENCOUNTER — Ambulatory Visit: Payer: Medicaid Other | Attending: Obstetrics and Gynecology | Admitting: *Deleted

## 2023-12-21 ENCOUNTER — Ambulatory Visit: Payer: Medicaid Other | Admitting: *Deleted

## 2023-12-21 ENCOUNTER — Ambulatory Visit (INDEPENDENT_AMBULATORY_CARE_PROVIDER_SITE_OTHER): Payer: Medicaid Other | Admitting: Obstetrics & Gynecology

## 2023-12-21 VITALS — BP 135/84 | HR 80

## 2023-12-21 VITALS — BP 144/87 | HR 82 | Wt 190.9 lb

## 2023-12-21 DIAGNOSIS — O09293 Supervision of pregnancy with other poor reproductive or obstetric history, third trimester: Secondary | ICD-10-CM

## 2023-12-21 DIAGNOSIS — O409XX0 Polyhydramnios, unspecified trimester, not applicable or unspecified: Secondary | ICD-10-CM | POA: Insufficient documentation

## 2023-12-21 DIAGNOSIS — O133 Gestational [pregnancy-induced] hypertension without significant proteinuria, third trimester: Secondary | ICD-10-CM | POA: Diagnosis not present

## 2023-12-21 DIAGNOSIS — O403XX0 Polyhydramnios, third trimester, not applicable or unspecified: Secondary | ICD-10-CM

## 2023-12-21 DIAGNOSIS — Z3A31 31 weeks gestation of pregnancy: Secondary | ICD-10-CM

## 2023-12-21 DIAGNOSIS — O139 Gestational [pregnancy-induced] hypertension without significant proteinuria, unspecified trimester: Secondary | ICD-10-CM | POA: Diagnosis present

## 2023-12-21 DIAGNOSIS — O0990 Supervision of high risk pregnancy, unspecified, unspecified trimester: Secondary | ICD-10-CM

## 2023-12-21 DIAGNOSIS — Z8759 Personal history of other complications of pregnancy, childbirth and the puerperium: Secondary | ICD-10-CM

## 2023-12-21 DIAGNOSIS — Z98891 History of uterine scar from previous surgery: Secondary | ICD-10-CM

## 2023-12-21 DIAGNOSIS — O0993 Supervision of high risk pregnancy, unspecified, third trimester: Secondary | ICD-10-CM

## 2023-12-21 DIAGNOSIS — O09299 Supervision of pregnancy with other poor reproductive or obstetric history, unspecified trimester: Secondary | ICD-10-CM

## 2023-12-21 NOTE — Procedures (Signed)
Stacy Christian 04-06-1991 [redacted]w[redacted]d  Fetus A Non-Stress Test Interpretation for 12/21/23  Indication: Polyhydramnios and GHTN  Fetal Heart Rate A Mode: External Baseline Rate (A): 130 bpm Variability: Moderate Accelerations: 15 x 15 Decelerations: None Multiple birth?: No  Uterine Activity Mode: Toco Contraction Frequency (min): None Resting Tone Palpated: Relaxed  Interpretation (Fetal Testing) Nonstress Test Interpretation: Reactive Comments: Tracing reviewed by Dr. Judeth Cornfield

## 2023-12-21 NOTE — Progress Notes (Signed)
   PRENATAL VISIT NOTE  Subjective:  Stacy Christian is a 33 y.o. [id number] at [redacted]w[redacted]d being seen today for ongoing prenatal care.  She is currently monitored for the following issues for this high-risk pregnancy and has History of premature delivery, currently pregnant; History of classical cesarean section; Hx of preeclampsia, prior pregnancy, currently pregnant; Asthma; History of neonatal death; Gestational hypertension; History of cesarean section; Supervision of high risk pregnancy, antepartum; History of placenta abruption; History of prior pregnancy with intrauterine growth restricted newborn; and Polyhydramnios affecting pregnancy in third trimester on their problem list.  Patient reports no complaints.  Contractions: Not present. Vag. Bleeding: None.  Movement: Present. Denies leaking of fluid.   The following portions of the patient's history were reviewed and updated as appropriate: allergies, current medications, past family history, past medical history, past social history, past surgical history and problem list.   Objective:   Vitals:   12/21/23 1114  BP: (!) 144/87  Pulse: 82  Weight: 190 lb 14.4 oz (86.6 kg)    Fetal Status: Fetal Heart Rate (bpm): 132 Fundal Height: 33 cm Movement: Present     General:  Alert, oriented and cooperative. Patient is in no acute distress.  Skin: Skin is warm and dry. No rash noted.   Cardiovascular: Normal heart rate noted  Respiratory: Normal respiratory effort, no problems with respiration noted  Abdomen: Soft, gravid, appropriate for gestational age.  Pain/Pressure: Absent     Pelvic: Cervical exam deferred        Extremities: Normal range of motion.  Edema: None  Mental Status: Normal mood and affect. Normal behavior. Normal judgment and thought content.   Assessment and Plan:  Pregnancy: [id number] at [redacted]w[redacted]d 1. Supervision of high risk pregnancy, antepartum (Primary)   2. Gestational hypertension, third trimester Stable BP  3.  History of classical cesarean section 37 week delivery  4. History of neonatal death   5. Hx of preeclampsia, prior pregnancy, currently pregnant   6. History of prior pregnancy with intrauterine growth restricted newborn Weekly fetal assessment  Preterm labor symptoms and general obstetric precautions including but not limited to vaginal bleeding, contractions, leaking of fluid and fetal movement were reviewed in detail with the patient. Please refer to After Visit Summary for other counseling recommendations.   Return in about 2 weeks (around 01/04/2024).  Future Appointments  Date Time Provider Department Center  12/28/2023  8:55 AM Adam Phenix, MD [facility]  12/28/2023 10:15 AM [facility] NURSE [facility]  12/28/2023 10:30 AM [facility] US5 [facility]  01/04/2024  9:15 AM [facility] NURSE [facility]  01/04/2024  9:30 AM [facility] US3 [facility]  01/04/2024 10:55 AM Celedonio Savage, MD [facility]  01/11/2024  9:15 AM [facility] NURSE [facility]  01/11/2024  9:30 AM [facility] US1 [facility]    Scheryl Darter, MD

## 2023-12-25 ENCOUNTER — Ambulatory Visit: Payer: Medicaid Other

## 2023-12-25 ENCOUNTER — Encounter: Payer: Medicaid Other | Admitting: Family Medicine

## 2023-12-25 ENCOUNTER — Other Ambulatory Visit: Payer: Medicaid Other

## 2023-12-28 ENCOUNTER — Ambulatory Visit (HOSPITAL_BASED_OUTPATIENT_CLINIC_OR_DEPARTMENT_OTHER): Payer: Medicaid Other | Admitting: *Deleted

## 2023-12-28 ENCOUNTER — Other Ambulatory Visit: Payer: Medicaid Other

## 2023-12-28 ENCOUNTER — Ambulatory Visit: Payer: Medicaid Other

## 2023-12-28 ENCOUNTER — Other Ambulatory Visit: Payer: Self-pay

## 2023-12-28 ENCOUNTER — Ambulatory Visit: Payer: Medicaid Other | Attending: Maternal & Fetal Medicine | Admitting: *Deleted

## 2023-12-28 ENCOUNTER — Encounter: Payer: Medicaid Other | Admitting: Obstetrics & Gynecology

## 2023-12-28 VITALS — BP 121/76 | HR 79

## 2023-12-28 DIAGNOSIS — Z8759 Personal history of other complications of pregnancy, childbirth and the puerperium: Secondary | ICD-10-CM | POA: Diagnosis not present

## 2023-12-28 DIAGNOSIS — Z3A32 32 weeks gestation of pregnancy: Secondary | ICD-10-CM | POA: Diagnosis not present

## 2023-12-28 DIAGNOSIS — O09299 Supervision of pregnancy with other poor reproductive or obstetric history, unspecified trimester: Secondary | ICD-10-CM

## 2023-12-28 DIAGNOSIS — O0990 Supervision of high risk pregnancy, unspecified, unspecified trimester: Secondary | ICD-10-CM

## 2023-12-28 DIAGNOSIS — O403XX0 Polyhydramnios, third trimester, not applicable or unspecified: Secondary | ICD-10-CM | POA: Diagnosis present

## 2023-12-28 NOTE — Procedures (Signed)
Stacy Christian [birth year] [redacted]w[redacted]d  Fetus A Non-Stress Test Interpretation for 12/28/23-Nst Only  Indication: Polyhydramnios, Abruption, PTD  Fetal Heart Rate A Mode: External Baseline Rate (A): 135 bpm Variability: Moderate Accelerations: 15 x 15 Decelerations: None Multiple birth?: No  Uterine Activity Mode: Toco Contraction Frequency (min): UI Resting Tone Palpated: Relaxed  Interpretation (Fetal Testing) Nonstress Test Interpretation: Reactive Comments: Tracing reviewed by Dr. Judeth Cornfield

## 2024-01-01 ENCOUNTER — Encounter: Payer: Medicaid Other | Admitting: Family Medicine

## 2024-01-01 ENCOUNTER — Other Ambulatory Visit: Payer: Medicaid Other

## 2024-01-01 ENCOUNTER — Ambulatory Visit: Payer: Medicaid Other

## 2024-01-04 ENCOUNTER — Other Ambulatory Visit: Payer: Medicaid Other

## 2024-01-04 ENCOUNTER — Encounter: Payer: Medicaid Other | Admitting: Family Medicine

## 2024-01-04 ENCOUNTER — Ambulatory Visit: Payer: Medicaid Other

## 2024-01-08 ENCOUNTER — Other Ambulatory Visit: Payer: Medicaid Other

## 2024-01-08 ENCOUNTER — Encounter: Payer: Medicaid Other | Admitting: Family Medicine

## 2024-01-08 ENCOUNTER — Ambulatory Visit: Payer: Medicaid Other

## 2024-01-11 ENCOUNTER — Ambulatory Visit: Payer: Medicaid Other | Admitting: *Deleted

## 2024-01-11 ENCOUNTER — Ambulatory Visit: Payer: Medicaid Other | Attending: Maternal & Fetal Medicine | Admitting: Maternal & Fetal Medicine

## 2024-01-11 ENCOUNTER — Ambulatory Visit: Payer: Medicaid Other | Attending: Obstetrics and Gynecology

## 2024-01-11 ENCOUNTER — Other Ambulatory Visit: Payer: Self-pay | Admitting: *Deleted

## 2024-01-11 VITALS — BP 136/81 | HR 89

## 2024-01-11 DIAGNOSIS — O09293 Supervision of pregnancy with other poor reproductive or obstetric history, third trimester: Secondary | ICD-10-CM | POA: Diagnosis not present

## 2024-01-11 DIAGNOSIS — O409XX0 Polyhydramnios, unspecified trimester, not applicable or unspecified: Secondary | ICD-10-CM

## 2024-01-11 DIAGNOSIS — Z3A34 34 weeks gestation of pregnancy: Secondary | ICD-10-CM | POA: Diagnosis not present

## 2024-01-11 DIAGNOSIS — O99513 Diseases of the respiratory system complicating pregnancy, third trimester: Secondary | ICD-10-CM | POA: Diagnosis not present

## 2024-01-11 DIAGNOSIS — O09299 Supervision of pregnancy with other poor reproductive or obstetric history, unspecified trimester: Secondary | ICD-10-CM

## 2024-01-11 DIAGNOSIS — Z98891 History of uterine scar from previous surgery: Secondary | ICD-10-CM | POA: Diagnosis not present

## 2024-01-11 DIAGNOSIS — O0990 Supervision of high risk pregnancy, unspecified, unspecified trimester: Secondary | ICD-10-CM | POA: Insufficient documentation

## 2024-01-11 DIAGNOSIS — J45909 Unspecified asthma, uncomplicated: Secondary | ICD-10-CM

## 2024-01-11 DIAGNOSIS — O133 Gestational [pregnancy-induced] hypertension without significant proteinuria, third trimester: Secondary | ICD-10-CM

## 2024-01-11 DIAGNOSIS — O09213 Supervision of pregnancy with history of pre-term labor, third trimester: Secondary | ICD-10-CM

## 2024-01-11 DIAGNOSIS — O403XX0 Polyhydramnios, third trimester, not applicable or unspecified: Secondary | ICD-10-CM | POA: Insufficient documentation

## 2024-01-11 DIAGNOSIS — O34219 Maternal care for unspecified type scar from previous cesarean delivery: Secondary | ICD-10-CM | POA: Diagnosis not present

## 2024-01-11 NOTE — Progress Notes (Signed)
 Patient information  Patient Name: Stacy Christian  Patient MRN:   [id number]  Referring practice: MFM Referring Provider: [facility])  MFM CONSULT  Stacy Christian is a 33 y.o. [id number] at [redacted]w[redacted]d here for ultrasound and consultation. Patient Active Problem List   Diagnosis Date Noted   Polyhydramnios affecting pregnancy in third trimester 12/11/2023   History of prior pregnancy with intrauterine growth restricted newborn 11/19/2023   History of placenta abruption 09/12/2023   Supervision of high risk pregnancy, antepartum 07/25/2023   History of cesarean section 11/16/2019   Gestational hypertension October 06, 2019   History of neonatal death 09-18-2019   Hx of preeclampsia, prior pregnancy, currently pregnant 07/11/2019   Asthma    History of premature delivery, currently pregnant 08/07/2017   History of classical cesarean section 08/07/2017   Stacy Christian is doing well today with no acute concerns. She denies contractions, bleeding, or loss of fluid and reports good fetal movement. She is at 34w 6d. EDD of 02/16/2024 dated by: LMP  (05/12/23). She has no concerns today.  RE gestational hypertension: The patients blood pressure is good today at 136/81. She denies headaches, vision changes or right upper quadrant pain.  I discussed the signs and symptoms of preeclampsia and the need to monitor her blood pressure at home.  I instructed her to go to the MAU if her blood pressure is elevated over 140 systolic or 90 diastolic if she also has a headache, vision changes right upper quadrant pain.  I discussed the importance of monitoring fetal movement and vaginal bleeding.  Sonographic findings Single intrauterine pregnancy. Fetal cardiac activity: Observed. Presentation: Cephalic. Interval fetal anatomy appears normal. Amniotic fluid: Polyhydramnios.  MVP: 9.66 cm. Placenta: Anterior. BPP: 8/8.   There are limitations of prenatal ultrasound such as  the inability to detect certain abnormalities due to poor visualization. Various factors such as fetal position, gestational age and maternal body habitus may increase the difficulty in visualizing the fetal anatomy.    Recommendations 1. Continue weekly antenatal testing until delivery 2. CD scheduled 2/22 3. Weekly CBC/CMP and UPC with OB provider 4. Precautions given  -Continue routine prenatal care with referring OB provider  Review of Systems: A review of systems was performed and was negative except per HPI   Vitals and Physical Exam    01/11/2024    9:17 AM 12/28/2023    9:55 AM 12/21/2023   11:14 AM  Vitals with BMI  Weight   190 lbs 14 oz  Systolic 136 121 855  Diastolic 81 76 87  Pulse 89 79 82    Sitting comfortably on the sonogram table Nonlabored breathing Normal rate and rhythm Abdomen is nontender  Past pregnancies OB History  Gravida Para Term Preterm AB Living  5 3 0 3 1 2   SAB IAB Ectopic Multiple Live Births  1 0 0 0 3    # Outcome Date GA Lbr Len/2nd Weight Sex Type Anes PTL Lv  5 Current           4 Preterm 11/16/19 [redacted]w[redacted]d  4 lb 13.4 oz (2.195 kg) M CS-LTranv Spinal  LIV  3 Preterm 12/07/17 [redacted]w[redacted]d   F CS-Unspec   LIV     Complications: Gestational hypertension, IUGR (intrauterine growth restriction) affecting care of mother  2 SAB 12/23/14 [redacted]w[redacted]d         1 Preterm 06/26/07 [redacted]w[redacted]d  1 lb 7 oz (0.652 kg) F CS-Classical  Y ND  Complications: Abruptio Placenta, Preterm labor    I spent 10 minutes reviewing the patients chart, including labs and images as well as counseling the patient about her medical conditions. Greater than 50% of the time was spent in direct face-to-face patient counseling.  Delora Smaller  MFM, [facility]   01/11/2024  10:15 AM

## 2024-01-12 ENCOUNTER — Inpatient Hospital Stay (HOSPITAL_COMMUNITY)
Admission: AD | Admit: 2024-01-12 | Discharge: 2024-01-12 | Disposition: A | Discharge status: Home / Self Care | Payer: Medicaid Other | Attending: Obstetrics and Gynecology | Admitting: Obstetrics and Gynecology

## 2024-01-12 ENCOUNTER — Encounter (HOSPITAL_COMMUNITY): Payer: Self-pay | Admitting: Obstetrics and Gynecology

## 2024-01-12 DIAGNOSIS — Z3A35 35 weeks gestation of pregnancy: Secondary | ICD-10-CM | POA: Diagnosis not present

## 2024-01-12 DIAGNOSIS — O133 Gestational [pregnancy-induced] hypertension without significant proteinuria, third trimester: Secondary | ICD-10-CM | POA: Diagnosis not present

## 2024-01-12 LAB — COMPREHENSIVE METABOLIC PANEL
ALT: 12 U/L (ref 0–44)
AST: 19 U/L (ref 15–41)
Albumin: 2.9 g/dL — ABNORMAL LOW (ref 3.5–5.0)
Alkaline Phosphatase: 96 U/L (ref 38–126)
Anion gap: 7 (ref 5–15)
BUN: <5 mg/dL — ABNORMAL LOW (ref 6–20)
CO2: 23 mmol/L (ref 22–32)
Calcium: 8.4 mg/dL — ABNORMAL LOW (ref 8.9–10.3)
Chloride: 105 mmol/L (ref 98–111)
Creatinine, Ser: 0.63 mg/dL (ref 0.44–1.00)
GFR, Estimated: >60 mL/min (ref >60)
Glucose, Bld: 79 mg/dL (ref 70–99)
Potassium: 3.1 mmol/L — ABNORMAL LOW (ref 3.5–5.1)
Sodium: 135 mmol/L (ref 135–145)
Total Bilirubin: 0.5 mg/dL (ref 0.0–1.2)
Total Protein: 6.8 g/dL (ref 6.5–8.1)

## 2024-01-12 LAB — CBC
HCT: 30.6 % — ABNORMAL LOW (ref 36.0–46.0)
Hemoglobin: 10.8 g/dL — ABNORMAL LOW (ref 12.0–15.0)
MCH: 29.8 pg (ref 26.0–34.0)
MCHC: 35.3 g/dL (ref 30.0–36.0)
MCV: 84.3 fL (ref 80.0–100.0)
Platelets: 185 10*3/uL (ref 150–400)
RBC: 3.63 MIL/uL — ABNORMAL LOW (ref 3.87–5.11)
RDW: 12.1 % (ref 11.5–15.5)
WBC: 10.8 10*3/uL — ABNORMAL HIGH (ref 4.0–10.5)
nRBC: 0 % (ref 0.0–0.2)

## 2024-01-12 LAB — PROTEIN / CREATININE RATIO, URINE
Creatinine, Urine: 42 mg/dL
Total Protein, Urine: <6 mg/dL

## 2024-01-12 MED ORDER — ACETAMINOPHEN 500 MG PO TABS
1000.0000 mg | ORAL_TABLET | Freq: Once | ORAL | Status: AC
  Administered 2024-01-12: 1000 mg via ORAL
  Filled 2024-01-12: qty 2

## 2024-01-12 MED ORDER — METOCLOPRAMIDE HCL 10 MG PO TABS
10.0000 mg | ORAL_TABLET | Freq: Once | ORAL | Status: AC
  Administered 2024-01-12: 10 mg via ORAL
  Filled 2024-01-12: qty 1

## 2024-01-12 NOTE — MAU Provider Note (Signed)
 History     CSN: [id number]  Arrival date and time: 01/12/24 1246   Event Date/Time   First Provider Initiated Contact with Patient 01/12/24 1355      Chief Complaint  Patient presents with   Headache   Hypertension   Emesis   HPI Stacy Christian is a 33 y.o. [id number] at [redacted]w[redacted]d who presents for hypertension. History of preeclampsia in previous pregnancies.  Was diagnosed with gestational hypertension last month and started on nifedipine  30 mg daily.  Reports headache today since this morning that she rates 3/10.  Has not treated symptoms.  Also had 1 episode of vomiting with some continued mild nausea.  Denies visual disturbance or epigastric pain.  Denies abdominal pain, diarrhea, LOF, vaginal bleeding.  Had noticed decreased fetal movement earlier today.  Reports normal movement since arriving to MAU.  OB History     Gravida  5   Para  3   Term  0   Preterm  3   AB  1   Living  2      SAB  1   IAB  0   Ectopic  0   Multiple  0   Live Births  3           Past Medical History:  Diagnosis Date   Asthma    Preeclampsia    Preterm labor     Past Surgical History:  Procedure Laterality Date   CESAREAN SECTION     CESAREAN SECTION N/A 11/16/2019   Procedure: CESAREAN SECTION;  Surgeon: Fredirick Glenys RAMAN, MD;  Location: [facility] LD [facility]S;  Service: Obstetrics;  Laterality: N/A;   TONSILLECTOMY      Family History  Problem Relation Age of Onset   Heart disease Mother    Diabetes Mother    Hypertension Mother    Diabetes Father    Hypertension Father    Heart disease Father    Cancer Maternal Grandmother    Diabetes Maternal Grandmother    Cancer Maternal Grandfather    Heart disease Paternal Grandmother    Heart disease Paternal Grandfather     Social History   Tobacco Use   Smoking status: Light Smoker    Current packs/day: 0.00    Types: Cigarettes    Last attempt to quit: 05/23/2019    Years since quitting: 4.6   Smokeless tobacco: Never   Vaping Use   Vaping status: Never Used  Substance Use Topics   Alcohol use: Not Currently    Comment: occasionally   Drug use: Not Currently    Types: Marijuana    Comment: working on decreasing    Allergies: No Known Allergies  No medications prior to admission.    Review of Systems  All other systems reviewed and are negative.  Physical Exam   Blood pressure 125/65, pulse 77, temperature (!) 97.5 F (36.4 C), temperature source Oral, resp. rate 16, height 5' 6 (1.676 m), weight 86.7 kg, last menstrual period 05/12/2023, SpO2 98%, unknown if currently breastfeeding. Patient Vitals for the past 24 hrs:  BP Temp Temp src Pulse Resp SpO2 Height Weight  01/12/24 1615 125/65 -- -- 77 -- 98 % -- --  01/12/24 1600 132/76 -- -- 78 -- 98 % -- --  01/12/24 1545 132/81 -- -- 79 -- 98 % -- --  01/12/24 1535 130/83 -- -- 82 -- 99 % -- --  01/12/24 1416 139/88 -- -- 89 -- -- -- --  01/12/24 1401 (!) 146/95 -- --  86 -- -- -- --  01/12/24 1346 (!) 141/92 -- -- 89 -- -- -- --  01/12/24 1331 (!) 143/92 -- -- 95 -- 98 % -- --  01/12/24 1325 (!) 138/95 -- -- 93 -- 98 % -- --  01/12/24 1304 (!) 141/88 (!) 97.5 F (36.4 C) Oral -- 16 100 % 5' 6 (1.676 m) 86.7 kg     Physical Exam Vitals and nursing note reviewed.  Constitutional:      General: She is not in acute distress.    Appearance: She is well-developed. She is not ill-appearing.  HENT:     Head: Normocephalic and atraumatic.  Eyes:     General: No scleral icterus.       Right eye: No discharge.        Left eye: No discharge.     Conjunctiva/sclera: Conjunctivae normal.  Pulmonary:     Effort: Pulmonary effort is normal. No respiratory distress.  Neurological:     General: No focal deficit present.     Mental Status: She is alert.  Psychiatric:        Mood and Affect: Mood normal.        Behavior: Behavior normal.    NST:  Baseline: 135 bpm, Variability: Good {> 6 bpm), Accelerations: Reactive, and Decelerations:  Absent  MAU Course  Procedures Results for orders placed or performed during the hospital encounter of 01/12/24 (from the past 24 hours)  Protein / creatinine ratio, urine     Status: None   Collection Time: 01/12/24  1:14 PM  Result Value Ref Range   Creatinine, Urine 42 mg/dL   Total Protein, Urine <6 mg/dL   Protein Creatinine Ratio        0.00 - 0.15 mg/mg[Cre]  CBC     Status: Abnormal   Collection Time: 01/12/24  1:52 PM  Result Value Ref Range   WBC 10.8 (H) 4.0 - 10.5 K/uL   RBC 3.63 (L) 3.87 - 5.11 MIL/uL   Hemoglobin 10.8 (L) 12.0 - 15.0 g/dL   HCT 69.3 (L) 63.9 - 53.9 %   MCV 84.3 80.0 - 100.0 fL   MCH 29.8 26.0 - 34.0 pg   MCHC 35.3 30.0 - 36.0 g/dL   RDW 87.8 88.4 - 84.4 %   Platelets 185 150 - 400 K/uL   nRBC 0.0 0.0 - 0.2 %  Comprehensive metabolic panel     Status: Abnormal   Collection Time: 01/12/24  1:52 PM  Result Value Ref Range   Sodium 135 135 - 145 mmol/L   Potassium 3.1 (L) 3.5 - 5.1 mmol/L   Chloride 105 98 - 111 mmol/L   CO2 23 22 - 32 mmol/L   Glucose, Bld 79 70 - 99 mg/dL   BUN <5 (L) 6 - 20 mg/dL   Creatinine, Ser 9.36 0.44 - 1.00 mg/dL   Calcium  8.4 (L) 8.9 - 10.3 mg/dL   Total Protein 6.8 6.5 - 8.1 g/dL   Albumin  2.9 (L) 3.5 - 5.0 g/dL   AST 19 15 - 41 U/L   ALT 12 0 - 44 U/L   Alkaline Phosphatase 96 38 - 126 U/L   Total Bilirubin 0.5 0.0 - 1.2 mg/dL   GFR, Estimated >39 >39 mL/min   Anion gap 7 5 - 15    MDM Labs Oral meds Chart review Continuous fetal monitoring  Assessment and Plan   1. Gestational hypertension, third trimester   2. [redacted] weeks gestation of pregnancy    -Reactive  fetal tracing.  Reports good movement while in MAU. -Intake BPs are mild range.  Last several BPs normal.  Headache and nausea resolved with medications given in the MAU.  Preeclampsia labs reassuring.  Reviewed patient with Dr. Nicholaus, will keep medications as currently prescribed. -Reviewed preeclampsia precautions and reasons to return to  MAU  Rocky Satterfield 01/12/2024, 5:08 PM

## 2024-01-12 NOTE — MAU Note (Signed)
 Stacy Christian is a 32 y.o. at [redacted]w[redacted]d here in MAU reporting: had US  appt yesterday, was told to watch for signs of Pre E.  Has vomited this morning, has mild HA- did not take anything for HA.  BP elevated at home 160/116 151/103  (after 1030).  Took BP med around 0900.  Denies visual changes, epigastric pain or increase in swelling. Bled after intercourse last night. No LOF.  Hasn't felt much movement today.  Loose stools last couple of days.  Onset of complaint: this morning Pain score: HA 1-2 Vitals:   01/12/24 1304  BP: (!) 141/88  Resp: 16  Temp: (!) 97.5 F (36.4 C)  SpO2: 100%     FHT:132 Lab orders placed from triage:  urine PCR

## 2024-01-15 NOTE — Patient Instructions (Signed)
Stacy Christian  01/15/2024   Your procedure is scheduled on:  01/26/2024  Arrive at 0730 at Entrance C on [address] at [facility]  and [employer]. You are invited to use the FREE valet parking or use the Visitor's parking deck.  Pick up the phone at the desk and dial [phone].  Call this number if you have problems the morning of surgery: [phone]  Remember:   Do not eat food:(After Midnight) Desps de medianoche.  You may drink clear liquids until arrival at __0730___.  Clear liquids means a liquid you can see thru.  It can have color such as Cola or Kool aid.  Tea is OK and coffee as long as no milk or creamer of any kind.  Take these medicines the morning of surgery with A SIP OF WATER:  nifedipine   Do not wear jewelry, make-up or nail polish.  Do not wear lotions, powders, or perfumes. Do not wear deodorant.  Do not shave 48 hours prior to surgery.  Do not bring valuables to the hospital.  [facility] is not   responsible for any belongings or valuables brought to the hospital.  Contacts, dentures or bridgework may not be worn into surgery.  Leave suitcase in the car. After surgery it may be brought to your room.  For patients admitted to the hospital, checkout time is 11:00 AM the day of              discharge.      Please read over the following fact sheets that you were given:     Preparing for Surgery

## 2024-01-16 ENCOUNTER — Ambulatory Visit (INDEPENDENT_AMBULATORY_CARE_PROVIDER_SITE_OTHER): Payer: Medicaid Other | Admitting: Family Medicine

## 2024-01-16 ENCOUNTER — Inpatient Hospital Stay (HOSPITAL_COMMUNITY)
Admission: AD | Admit: 2024-01-16 | Discharge: 2024-01-19 | DRG: 787 | Disposition: A | Discharge status: Home / Self Care | Payer: Medicaid Other | Attending: Obstetrics and Gynecology | Admitting: Obstetrics and Gynecology

## 2024-01-16 ENCOUNTER — Inpatient Hospital Stay (HOSPITAL_COMMUNITY): Payer: Medicaid Other | Admitting: Anesthesiology

## 2024-01-16 ENCOUNTER — Other Ambulatory Visit: Payer: Self-pay

## 2024-01-16 ENCOUNTER — Encounter (HOSPITAL_COMMUNITY): Payer: Self-pay | Admitting: Family Medicine

## 2024-01-16 ENCOUNTER — Encounter (HOSPITAL_COMMUNITY)
Admission: AD | Disposition: A | Discharge status: Home / Self Care | Payer: Self-pay | Source: Home / Self Care | Attending: Obstetrics and Gynecology

## 2024-01-16 VITALS — BP 145/92 | HR 86 | Wt 194.5 lb

## 2024-01-16 DIAGNOSIS — O42013 Preterm premature rupture of membranes, onset of labor within 24 hours of rupture, third trimester: Secondary | ICD-10-CM | POA: Diagnosis not present

## 2024-01-16 DIAGNOSIS — O403XX0 Polyhydramnios, third trimester, not applicable or unspecified: Secondary | ICD-10-CM | POA: Diagnosis not present

## 2024-01-16 DIAGNOSIS — Z98891 History of uterine scar from previous surgery: Secondary | ICD-10-CM | POA: Diagnosis not present

## 2024-01-16 DIAGNOSIS — D62 Acute posthemorrhagic anemia: Secondary | ICD-10-CM | POA: Diagnosis not present

## 2024-01-16 DIAGNOSIS — O134 Gestational [pregnancy-induced] hypertension without significant proteinuria, complicating childbirth: Secondary | ICD-10-CM | POA: Diagnosis present

## 2024-01-16 DIAGNOSIS — O34211 Maternal care for low transverse scar from previous cesarean delivery: Secondary | ICD-10-CM | POA: Diagnosis not present

## 2024-01-16 DIAGNOSIS — O99334 Smoking (tobacco) complicating childbirth: Secondary | ICD-10-CM | POA: Diagnosis present

## 2024-01-16 DIAGNOSIS — O42913 Preterm premature rupture of membranes, unspecified as to length of time between rupture and onset of labor, third trimester: Principal | ICD-10-CM | POA: Diagnosis present

## 2024-01-16 DIAGNOSIS — O09293 Supervision of pregnancy with other poor reproductive or obstetric history, third trimester: Secondary | ICD-10-CM | POA: Diagnosis not present

## 2024-01-16 DIAGNOSIS — F17210 Nicotine dependence, cigarettes, uncomplicated: Secondary | ICD-10-CM | POA: Diagnosis present

## 2024-01-16 DIAGNOSIS — Z833 Family history of diabetes mellitus: Secondary | ICD-10-CM

## 2024-01-16 DIAGNOSIS — O133 Gestational [pregnancy-induced] hypertension without significant proteinuria, third trimester: Secondary | ICD-10-CM | POA: Diagnosis not present

## 2024-01-16 DIAGNOSIS — O09299 Supervision of pregnancy with other poor reproductive or obstetric history, unspecified trimester: Secondary | ICD-10-CM

## 2024-01-16 DIAGNOSIS — Z8249 Family history of ischemic heart disease and other diseases of the circulatory system: Secondary | ICD-10-CM

## 2024-01-16 DIAGNOSIS — Z30017 Encounter for initial prescription of implantable subdermal contraceptive: Secondary | ICD-10-CM | POA: Diagnosis not present

## 2024-01-16 DIAGNOSIS — O9081 Anemia of the puerperium: Secondary | ICD-10-CM | POA: Diagnosis not present

## 2024-01-16 DIAGNOSIS — O0993 Supervision of high risk pregnancy, unspecified, third trimester: Secondary | ICD-10-CM | POA: Diagnosis not present

## 2024-01-16 DIAGNOSIS — Z3A35 35 weeks gestation of pregnancy: Secondary | ICD-10-CM

## 2024-01-16 DIAGNOSIS — O42919 Preterm premature rupture of membranes, unspecified as to length of time between rupture and onset of labor, unspecified trimester: Principal | ICD-10-CM | POA: Diagnosis present

## 2024-01-16 DIAGNOSIS — O34219 Maternal care for unspecified type scar from previous cesarean delivery: Secondary | ICD-10-CM | POA: Diagnosis not present

## 2024-01-16 DIAGNOSIS — Z3A00 Weeks of gestation of pregnancy not specified: Secondary | ICD-10-CM | POA: Diagnosis not present

## 2024-01-16 DIAGNOSIS — O0990 Supervision of high risk pregnancy, unspecified, unspecified trimester: Secondary | ICD-10-CM

## 2024-01-16 DIAGNOSIS — Z5982 Transportation insecurity: Secondary | ICD-10-CM

## 2024-01-16 DIAGNOSIS — O43813 Placental infarction, third trimester: Secondary | ICD-10-CM | POA: Diagnosis not present

## 2024-01-16 DIAGNOSIS — O43893 Other placental disorders, third trimester: Secondary | ICD-10-CM | POA: Diagnosis not present

## 2024-01-16 LAB — DIC (DISSEMINATED INTRAVASCULAR COAGULATION)PANEL
D-Dimer, Quant: 3.59 ug{FEU}/mL — ABNORMAL HIGH (ref 0.00–0.50)
Fibrinogen: 296 mg/dL (ref 210–475)
INR: 1.1 (ref 0.8–1.2)
Platelets: 157 10*3/uL (ref 150–400)
Prothrombin Time: 14 s (ref 11.4–15.2)
Smear Review: NONE SEEN
aPTT: 29 s (ref 24–36)

## 2024-01-16 LAB — COMPREHENSIVE METABOLIC PANEL
ALT: 12 U/L (ref 0–44)
AST: 19 U/L (ref 15–41)
Albumin: 3 g/dL — ABNORMAL LOW (ref 3.5–5.0)
Alkaline Phosphatase: 107 U/L (ref 38–126)
Anion gap: 11 (ref 5–15)
BUN: 5 mg/dL — ABNORMAL LOW (ref 6–20)
CO2: 20 mmol/L — ABNORMAL LOW (ref 22–32)
Calcium: 8.7 mg/dL — ABNORMAL LOW (ref 8.9–10.3)
Chloride: 104 mmol/L (ref 98–111)
Creatinine, Ser: 0.48 mg/dL (ref 0.44–1.00)
GFR, Estimated: >60 mL/min (ref >60)
Glucose, Bld: 65 mg/dL — ABNORMAL LOW (ref 70–99)
Potassium: 3.5 mmol/L (ref 3.5–5.1)
Sodium: 135 mmol/L (ref 135–145)
Total Bilirubin: 0.4 mg/dL (ref 0.0–1.2)
Total Protein: 7 g/dL (ref 6.5–8.1)

## 2024-01-16 LAB — CBC
HCT: 33 % — ABNORMAL LOW (ref 36.0–46.0)
HCT: 26.7 % — ABNORMAL LOW (ref 36.0–46.0)
Hemoglobin: 11.5 g/dL — ABNORMAL LOW (ref 12.0–15.0)
Hemoglobin: 9.4 g/dL — ABNORMAL LOW (ref 12.0–15.0)
MCH: 29.4 pg (ref 26.0–34.0)
MCH: 29.8 pg (ref 26.0–34.0)
MCHC: 34.8 g/dL (ref 30.0–36.0)
MCHC: 35.2 g/dL (ref 30.0–36.0)
MCV: 84.4 fL (ref 80.0–100.0)
MCV: 84.8 fL (ref 80.0–100.0)
Platelets: 171 10*3/uL (ref 150–400)
Platelets: 157 10*3/uL (ref 150–400)
RBC: 3.91 MIL/uL (ref 3.87–5.11)
RBC: 3.15 MIL/uL — ABNORMAL LOW (ref 3.87–5.11)
RDW: 12.1 % (ref 11.5–15.5)
RDW: 12.2 % (ref 11.5–15.5)
WBC: 10.8 10*3/uL — ABNORMAL HIGH (ref 4.0–10.5)
WBC: 14.8 10*3/uL — ABNORMAL HIGH (ref 4.0–10.5)
nRBC: 0 % (ref 0.0–0.2)
nRBC: 0 % (ref 0.0–0.2)

## 2024-01-16 LAB — POSTPARTUM HEMORRHAGE (BB NOTIFICATION)

## 2024-01-16 SURGERY — Surgical Case
Anesthesia: Spinal

## 2024-01-16 MED ORDER — STERILE WATER FOR IRRIGATION IR SOLN
Status: DC | PRN
  Administered 2024-01-16: 1

## 2024-01-16 MED ORDER — MENTHOL 3 MG MT LOZG: 1.0000 | LOZENGE | OROMUCOSAL | Status: DC | PRN

## 2024-01-16 MED ORDER — ACETAMINOPHEN 325 MG PO TABS: 325.0000 mg | ORAL_TABLET | ORAL | Status: DC | PRN

## 2024-01-16 MED ORDER — LACTATED RINGERS IV BOLUS
1000.0000 mL | Freq: Once | INTRAVENOUS | Status: DC
Start: 2024-01-16 — End: 2024-01-16

## 2024-01-16 MED ORDER — CEFAZOLIN SODIUM-DEXTROSE 2-4 GM/100ML-% IV SOLN
INTRAVENOUS | Status: AC
Start: 2024-01-16 — End: ?
  Filled 2024-01-16: qty 100

## 2024-01-16 MED ORDER — OXYTOCIN-SODIUM CHLORIDE 30-0.9 UT/500ML-% IV SOLN
INTRAVENOUS | Status: DC | PRN
  Administered 2024-01-16: 30 [IU] via INTRAVENOUS

## 2024-01-16 MED ORDER — KETOROLAC TROMETHAMINE 30 MG/ML IJ SOLN
30.0000 mg | Freq: Four times a day (QID) | INTRAMUSCULAR | Status: DC | PRN
  Administered 2024-01-16: 30 mg via INTRAVENOUS
  Filled 2024-01-16: qty 1

## 2024-01-16 MED ORDER — KETOROLAC TROMETHAMINE 30 MG/ML IJ SOLN: 30.0000 mg | Freq: Four times a day (QID) | INTRAMUSCULAR | Status: DC | PRN

## 2024-01-16 MED ORDER — TRANEXAMIC ACID-NACL 1000-0.7 MG/100ML-% IV SOLN
1000.0000 mg | INTRAVENOUS | Status: AC
  Administered 2024-01-16: 1000 mg via INTRAVENOUS

## 2024-01-16 MED ORDER — SENNOSIDES-DOCUSATE SODIUM 8.6-50 MG PO TABS
2.0000 | ORAL_TABLET | Freq: Every day | ORAL | Status: DC
  Administered 2024-01-17 – 2024-01-18 (×2): 2 via ORAL
  Filled 2024-01-16 (×2): qty 2

## 2024-01-16 MED ORDER — ALBUMIN HUMAN 5 % IV SOLN: INTRAVENOUS | Status: DC | PRN

## 2024-01-16 MED ORDER — DIPHENHYDRAMINE HCL 50 MG/ML IJ SOLN
12.5000 mg | Freq: Once | INTRAMUSCULAR | Status: AC
  Administered 2024-01-16: 12.5 mg via INTRAVENOUS

## 2024-01-16 MED ORDER — ENOXAPARIN SODIUM 40 MG/0.4ML IJ SOSY
40.0000 mg | PREFILLED_SYRINGE | INTRAMUSCULAR | Status: DC
  Administered 2024-01-17 – 2024-01-18 (×2): 40 mg via SUBCUTANEOUS
  Filled 2024-01-16 (×2): qty 0.4

## 2024-01-16 MED ORDER — SCOPOLAMINE 1 MG/3DAYS TD PT72: 1.0000 | MEDICATED_PATCH | Freq: Once | TRANSDERMAL | Status: DC

## 2024-01-16 MED ORDER — OXYCODONE HCL 5 MG PO TABS
5.0000 mg | ORAL_TABLET | Freq: Four times a day (QID) | ORAL | Status: DC | PRN
  Administered 2024-01-17 – 2024-01-18 (×2): 5 mg via ORAL
  Filled 2024-01-16 (×2): qty 1

## 2024-01-16 MED ORDER — ACETAMINOPHEN 10 MG/ML IV SOLN
INTRAVENOUS | Status: AC
  Filled 2024-01-16: qty 100

## 2024-01-16 MED ORDER — MISOPROSTOL 200 MCG PO TABS
800.0000 ug | ORAL_TABLET | Freq: Once | ORAL | Status: AC
  Administered 2024-01-16: 800 ug via RECTAL

## 2024-01-16 MED ORDER — ACETAMINOPHEN 10 MG/ML IV SOLN
1000.0000 mg | Freq: Once | INTRAVENOUS | Status: DC | PRN
  Administered 2024-01-16: 1000 mg via INTRAVENOUS

## 2024-01-16 MED ORDER — FENTANYL CITRATE (PF) 100 MCG/2ML IJ SOLN
25.0000 ug | INTRAMUSCULAR | Status: DC | PRN
  Administered 2024-01-16 (×2): 25 ug via INTRAVENOUS
  Administered 2024-01-16 (×2): 50 ug via INTRAVENOUS

## 2024-01-16 MED ORDER — SODIUM CHLORIDE 0.9% FLUSH: 3.0000 mL | INTRAVENOUS | Status: DC | PRN

## 2024-01-16 MED ORDER — PHENYLEPHRINE 80 MCG/ML (10ML) SYRINGE FOR IV PUSH (FOR BLOOD PRESSURE SUPPORT)
PREFILLED_SYRINGE | INTRAVENOUS | Status: AC
  Filled 2024-01-16: qty 10

## 2024-01-16 MED ORDER — FENTANYL CITRATE (PF) 100 MCG/2ML IJ SOLN
INTRAMUSCULAR | Status: DC | PRN
Start: 2024-01-16 — End: 2024-01-16
  Administered 2024-01-16: 15 ug via INTRATHECAL

## 2024-01-16 MED ORDER — ONDANSETRON HCL 4 MG/2ML IJ SOLN: 4.0000 mg | Freq: Three times a day (TID) | INTRAMUSCULAR | Status: DC | PRN

## 2024-01-16 MED ORDER — CEFAZOLIN SODIUM-DEXTROSE 2-4 GM/100ML-% IV SOLN
INTRAVENOUS | Status: AC
  Filled 2024-01-16: qty 100

## 2024-01-16 MED ORDER — NALOXONE HCL 0.4 MG/ML IJ SOLN: 0.4000 mg | INTRAMUSCULAR | Status: DC | PRN

## 2024-01-16 MED ORDER — POVIDONE-IODINE 10 % EX SWAB
2.0000 | Freq: Once | CUTANEOUS | Status: AC
  Administered 2024-01-16: 2 via TOPICAL

## 2024-01-16 MED ORDER — SIMETHICONE 80 MG PO CHEW
80.0000 mg | CHEWABLE_TABLET | Freq: Three times a day (TID) | ORAL | Status: DC
  Administered 2024-01-17 – 2024-01-18 (×5): 80 mg via ORAL
  Filled 2024-01-16 (×5): qty 1

## 2024-01-16 MED ORDER — SOD CITRATE-CITRIC ACID 500-334 MG/5ML PO SOLN
30.0000 mL | Freq: Once | ORAL | Status: AC
  Administered 2024-01-16: 30 mL via ORAL

## 2024-01-16 MED ORDER — FENTANYL CITRATE (PF) 100 MCG/2ML IJ SOLN
INTRAMUSCULAR | Status: AC
Start: 2024-01-16 — End: ?
  Filled 2024-01-16: qty 2

## 2024-01-16 MED ORDER — SODIUM CHLORIDE 0.9 % IV SOLN
500.0000 mg | Freq: Once | INTRAVENOUS | Status: AC
  Administered 2024-01-16: 500 mg via INTRAVENOUS

## 2024-01-16 MED ORDER — BUPIVACAINE IN DEXTROSE 0.75-8.25 % IT SOLN
INTRATHECAL | Status: DC | PRN
Start: 2024-01-16 — End: 2024-01-16
  Administered 2024-01-16: 1.6 mL via INTRATHECAL

## 2024-01-16 MED ORDER — MORPHINE SULFATE (PF) 0.5 MG/ML IJ SOLN
INTRAMUSCULAR | Status: AC
  Filled 2024-01-16: qty 10

## 2024-01-16 MED ORDER — AZITHROMYCIN 500 MG IV SOLR
INTRAVENOUS | Status: AC
  Filled 2024-01-16: qty 5

## 2024-01-16 MED ORDER — MEPERIDINE HCL 25 MG/ML IJ SOLN: 6.2500 mg | INTRAMUSCULAR | Status: DC | PRN

## 2024-01-16 MED ORDER — SOD CITRATE-CITRIC ACID 500-334 MG/5ML PO SOLN
ORAL | Status: AC
  Filled 2024-01-16: qty 30

## 2024-01-16 MED ORDER — NALOXONE HCL 4 MG/10ML IJ SOLN: 1.0000 ug/kg/h | INTRAVENOUS | Status: DC | PRN

## 2024-01-16 MED ORDER — PHENYLEPHRINE HCL-NACL 20-0.9 MG/250ML-% IV SOLN
INTRAVENOUS | Status: AC
  Filled 2024-01-16: qty 250

## 2024-01-16 MED ORDER — MORPHINE SULFATE (PF) 0.5 MG/ML IJ SOLN
INTRAMUSCULAR | Status: DC | PRN
Start: 2024-01-16 — End: 2024-01-16
  Administered 2024-01-16: 150 ug via INTRATHECAL

## 2024-01-16 MED ORDER — WITCH HAZEL-GLYCERIN EX PADS: 1.0000 | MEDICATED_PAD | CUTANEOUS | Status: DC | PRN

## 2024-01-16 MED ORDER — TRANEXAMIC ACID-NACL 1000-0.7 MG/100ML-% IV SOLN
INTRAVENOUS | Status: AC
Start: 2024-01-16 — End: ?
  Filled 2024-01-16: qty 100

## 2024-01-16 MED ORDER — SODIUM CHLORIDE 0.9% FLUSH
3.0000 mL | Freq: Two times a day (BID) | INTRAVENOUS | Status: DC
  Administered 2024-01-17 – 2024-01-18 (×3): 10 mL via INTRAVENOUS

## 2024-01-16 MED ORDER — DIPHENHYDRAMINE HCL 25 MG PO CAPS: 25.0000 mg | ORAL_CAPSULE | ORAL | Status: DC | PRN

## 2024-01-16 MED ORDER — OXYTOCIN-SODIUM CHLORIDE 30-0.9 UT/500ML-% IV SOLN: 2.5000 [IU]/h | INTRAVENOUS | Status: AC

## 2024-01-16 MED ORDER — METOCLOPRAMIDE HCL 5 MG/ML IJ SOLN
INTRAMUSCULAR | Status: AC
  Filled 2024-01-16: qty 2

## 2024-01-16 MED ORDER — DEXAMETHASONE SODIUM PHOSPHATE 10 MG/ML IJ SOLN
INTRAMUSCULAR | Status: AC
  Filled 2024-01-16: qty 1

## 2024-01-16 MED ORDER — DIPHENHYDRAMINE HCL 50 MG/ML IJ SOLN
INTRAMUSCULAR | Status: AC
  Filled 2024-01-16: qty 1

## 2024-01-16 MED ORDER — COCONUT OIL OIL: 1.0000 | TOPICAL_OIL | Status: DC | PRN

## 2024-01-16 MED ORDER — ONDANSETRON HCL 4 MG/2ML IJ SOLN
INTRAMUSCULAR | Status: AC
  Filled 2024-01-16: qty 2

## 2024-01-16 MED ORDER — SCOPOLAMINE 1 MG/3DAYS TD PT72
MEDICATED_PATCH | TRANSDERMAL | Status: AC
  Filled 2024-01-16: qty 1

## 2024-01-16 MED ORDER — SIMETHICONE 80 MG PO CHEW: 80.0000 mg | CHEWABLE_TABLET | ORAL | Status: DC | PRN

## 2024-01-16 MED ORDER — MISOPROSTOL 200 MCG PO TABS
ORAL_TABLET | ORAL | Status: AC
  Filled 2024-01-16: qty 4

## 2024-01-16 MED ORDER — LACTATED RINGERS IV SOLN: INTRAVENOUS | Status: DC

## 2024-01-16 MED ORDER — ACETAMINOPHEN 160 MG/5ML PO SOLN: 325.0000 mg | ORAL | Status: DC | PRN

## 2024-01-16 MED ORDER — MAGNESIUM HYDROXIDE 400 MG/5ML PO SUSP: 30.0000 mL | ORAL | Status: DC | PRN

## 2024-01-16 MED ORDER — DIBUCAINE (PERIANAL) 1 % EX OINT: 1.0000 | TOPICAL_OINTMENT | CUTANEOUS | Status: DC | PRN

## 2024-01-16 MED ORDER — DIPHENHYDRAMINE HCL 25 MG PO CAPS: 25.0000 mg | ORAL_CAPSULE | Freq: Four times a day (QID) | ORAL | Status: DC | PRN

## 2024-01-16 MED ORDER — GABAPENTIN 100 MG PO CAPS
200.0000 mg | ORAL_CAPSULE | Freq: Every day | ORAL | Status: DC
  Administered 2024-01-17 – 2024-01-18 (×3): 200 mg via ORAL
  Filled 2024-01-16 (×3): qty 2

## 2024-01-16 MED ORDER — TRANEXAMIC ACID-NACL 1000-0.7 MG/100ML-% IV SOLN
INTRAVENOUS | Status: AC
  Filled 2024-01-16: qty 100

## 2024-01-16 MED ORDER — FENTANYL CITRATE (PF) 100 MCG/2ML IJ SOLN
INTRAMUSCULAR | Status: AC
  Filled 2024-01-16: qty 2

## 2024-01-16 MED ORDER — PRENATAL MULTIVITAMIN CH
1.0000 | ORAL_TABLET | Freq: Every day | ORAL | Status: DC
  Administered 2024-01-17 – 2024-01-18 (×2): 1 via ORAL
  Filled 2024-01-16 (×2): qty 1

## 2024-01-16 MED ORDER — IBUPROFEN 600 MG PO TABS
600.0000 mg | ORAL_TABLET | Freq: Four times a day (QID) | ORAL | Status: DC
  Administered 2024-01-18 – 2024-01-19 (×5): 600 mg via ORAL
  Filled 2024-01-16 (×6): qty 1

## 2024-01-16 MED ORDER — OXYTOCIN-SODIUM CHLORIDE 30-0.9 UT/500ML-% IV SOLN
INTRAVENOUS | Status: AC
  Filled 2024-01-16: qty 500

## 2024-01-16 MED ORDER — ACETAMINOPHEN 500 MG PO TABS
1000.0000 mg | ORAL_TABLET | Freq: Four times a day (QID) | ORAL | Status: DC
  Administered 2024-01-17 – 2024-01-19 (×10): 1000 mg via ORAL
  Filled 2024-01-16 (×12): qty 2

## 2024-01-16 MED ORDER — CEFAZOLIN SODIUM-DEXTROSE 2-4 GM/100ML-% IV SOLN
2.0000 g | Freq: Once | INTRAVENOUS | Status: AC
  Administered 2024-01-16: 2 g via INTRAVENOUS

## 2024-01-16 MED ORDER — KETOROLAC TROMETHAMINE 30 MG/ML IJ SOLN
30.0000 mg | Freq: Four times a day (QID) | INTRAMUSCULAR | Status: AC
  Administered 2024-01-17 – 2024-01-18 (×4): 30 mg via INTRAVENOUS
  Filled 2024-01-16 (×4): qty 1

## 2024-01-16 MED ORDER — DIPHENHYDRAMINE HCL 50 MG/ML IJ SOLN
12.5000 mg | INTRAMUSCULAR | Status: DC | PRN
  Administered 2024-01-16: 12.5 mg via INTRAVENOUS

## 2024-01-16 SURGICAL SUPPLY — 30 items
BARRIER ADHS 3X4 INTERCEED (GAUZE/BANDAGES/DRESSINGS) IMPLANT
BENZOIN TINCTURE PRP APPL 2/3 (GAUZE/BANDAGES/DRESSINGS) IMPLANT
CHLORAPREP W/TINT 26 (MISCELLANEOUS) ×2 IMPLANT
CLAMP UMBILICAL CORD (MISCELLANEOUS) ×1 IMPLANT
CLOTH BEACON ORANGE TIMEOUT ST (SAFETY) ×1 IMPLANT
DRSG OPSITE POSTOP 4X10 (GAUZE/BANDAGES/DRESSINGS) ×1 IMPLANT
ELECT REM PT RETURN 9FT ADLT (ELECTROSURGICAL) ×1 IMPLANT
ELECTRODE REM PT RTRN 9FT ADLT (ELECTROSURGICAL) ×1 IMPLANT
EXTRACTOR VACUUM M CUP 4 TUBE (SUCTIONS) IMPLANT
GAUZE PAD ABD 7.5X8 STRL (GAUZE/BANDAGES/DRESSINGS) IMPLANT
GAUZE SPONGE 4X4 12PLY STRL LF (GAUZE/BANDAGES/DRESSINGS) IMPLANT
GLOVE BIOGEL PI IND STRL 7.0 (GLOVE) ×2 IMPLANT
GLOVE BIOGEL PI IND STRL 8 (GLOVE) ×1 IMPLANT
GLOVE ECLIPSE 7.5 STRL STRAW (GLOVE) ×1 IMPLANT
GOWN STRL REUS W/TWL LRG LVL3 (GOWN DISPOSABLE) ×2 IMPLANT
GOWN STRL REUS W/TWL XL LVL3 (GOWN DISPOSABLE) ×1 IMPLANT
KIT ABG SYR 3ML LUER SLIP (SYRINGE) IMPLANT
NDL HYPO 25X5/8 SAFETYGLIDE (NEEDLE) IMPLANT
NEEDLE HYPO 25X5/8 SAFETYGLIDE (NEEDLE) IMPLANT
NS IRRIG 1000ML POUR BTL (IV SOLUTION) ×1 IMPLANT
PACK C SECTION WH (CUSTOM PROCEDURE TRAY) ×1 IMPLANT
PAD OB MATERNITY 4.3X12.25 (PERSONAL CARE ITEMS) ×1 IMPLANT
RTRCTR C-SECT PINK 25CM LRG (MISCELLANEOUS) ×1 IMPLANT
SUT MNCRL 0 VIOLET CTX 36 (SUTURE) ×2 IMPLANT
SUT VIC AB 0 CTX36XBRD ANBCTRL (SUTURE) ×1 IMPLANT
SUT VIC AB 2-0 CT1 TAPERPNT 27 (SUTURE) ×1 IMPLANT
SUT VIC AB 4-0 KS 27 (SUTURE) ×1 IMPLANT
TOWEL OR 17X24 6PK STRL BLUE (TOWEL DISPOSABLE) ×1 IMPLANT
TRAY FOLEY W/BAG SLVR 14FR LF (SET/KITS/TRAYS/PACK) ×1 IMPLANT
WATER STERILE IRR 1000ML POUR (IV SOLUTION) ×1 IMPLANT

## 2024-01-16 NOTE — Progress Notes (Signed)
   PRENATAL VISIT NOTE  Subjective:  Stacy Christian is a 33 y.o. [id number] at [redacted]w[redacted]d being seen today for ongoing prenatal care.  She is currently monitored for the following issues for this high-risk pregnancy and has History of premature delivery, currently pregnant; History of classical cesarean section; Hx of preeclampsia, prior pregnancy, currently pregnant; Asthma; History of neonatal death; Gestational hypertension; History of cesarean section; Supervision of high risk pregnancy, antepartum; History of placenta abruption; History of prior pregnancy with intrauterine growth restricted newborn; and Polyhydramnios affecting pregnancy in third trimester on their problem list.  Patient reports  taking medication this AM .  Contractions: Not present. Vag. Bleeding: None.  Movement: Present. Denies leaking of fluid.   The following portions of the patient's history were reviewed and updated as appropriate: allergies, current medications, past family history, past medical history, past social history, past surgical history and problem list.   Objective:   Vitals:   01/16/24 0925 01/16/24 0929  BP: (!) 155/109 (!) 145/92  Pulse: 85 86  Weight: 194 lb 8 oz (88.2 kg)     Fetal Status: Fetal Heart Rate (bpm): 140 Fundal Height: 36 cm Movement: Present     General:  Alert, oriented and cooperative. Patient is in no acute distress.  Skin: Skin is warm and dry. No rash noted.   Cardiovascular: Normal heart rate noted  Respiratory: Normal respiratory effort, no problems with respiration noted  Abdomen: Soft, gravid, appropriate for gestational age.  Pain/Pressure: Absent     Pelvic: Cervical exam deferred        Extremities: Normal range of motion.  Edema: None  Mental Status: Normal mood and affect. Normal behavior. Normal judgment and thought content.   Assessment and Plan:  Pregnancy: [id number] at [redacted]w[redacted]d 1. Supervision of high risk pregnancy, antepartum (Primary) Up to date No concerns  today Vigorous movement Plan on Nexplanon- inpatient  2. Polyhydramnios affecting pregnancy in third trimester AFI 26 3. Hx of preeclampsia, prior pregnancy, currently pregnant Currently with ghtn, monitor closely  4. History of cesarean section  5. Gestational hypertension, third trimester On nifedipine, took medication today BP is elevated Denies HA, blurry vision, changes in vision, RUQ pain, sudden swelling No severe features and feel outpatient labs are appropriate, no need for MAU assessment Last labs were on 2/8 and WNL Last [place] was 2/7, scheduled for BPP 2/14. Reviewed plan for weekly BPP and labs until CS  6. History of classical cesarean section RCS on 2/22 with [place]  Preterm labor symptoms and general obstetric precautions including but not limited to vaginal bleeding, contractions, leaking of fluid and fetal movement were reviewed in detail with the patient. Please refer to After Visit Summary for other counseling recommendations.   Return in about 1 week (around 01/23/2024) for Routine prenatal care, 36wks.  Future Appointments  Date Time Provider Department Center  01/18/2024  9:30 AM [facility] NURSE [facility]  01/18/2024  9:45 AM [facility] NST [facility]  01/24/2024  9:00 AM [facility] PAT 1 [facility] None  01/24/2024 11:15 AM [facility] NURSE [facility]  01/24/2024 11:30 AM [facility] US7 [facility]    Federico Flake, MD

## 2024-01-16 NOTE — Discharge Summary (Signed)
Postpartum Discharge Summary  Date of Service updated-2/15     Patient Name: Stacy Christian DOB: [birth year] MRN: [id number]  Date of admission: 01/16/2024 Delivery date:01/16/2024 Delivering provider: Celedonio Savage Date of discharge: 01/19/2024  Admitting diagnosis: Preterm premature rupture of membranes (PPROM) with unknown onset of labor [O42.919] S/P cesarean section [Z98.891] Intrauterine pregnancy: [redacted]w[redacted]d     Secondary diagnosis:  Principal Problem:   Preterm premature rupture of membranes (PPROM) with unknown onset of labor Active Problems:   S/P cesarean section  Additional problems: gestational HTN    Discharge diagnosis: Preterm Pregnancy Delivered, gestational HTN, and PPH                                              Post partum procedures: none Augmentation: N/A Complications: Hemorrhage>1027mL  Hospital course: Onset of Labor With Unplanned C/S   33 y.o. yo [id number] at [redacted]w[redacted]d was admitted for [facility] 01/16/2024. Patient had a labor course significant for PPROM at [redacted]w[redacted]d. The patient went for cesarean section due to hx C/S x 3, PPROM. Delivery details as follows: Membrane Rupture Time/Date: 11:00 AM,01/16/2024  Delivery Method:C-Section, Low Transverse Operative Delivery:N/A Details of operation can be found in separate operative note. Patient had a postpartum course complicated by gHTN.  Treated with oral Lasix x 5 days.  Pt also noted to have symptomatic anemia- pt treated with 2u pRBC.  HGb improved from 6.1 to 8.  She is ambulating,tolerating a regular diet, passing flatus, and urinating well.  Patient is discharged home in stable condition 01/19/24.  Newborn Data: Birth date:01/16/2024 Birth time:5:12 PM Gender:Female Living status:Living Apgars:8 ,9  Weight:2450 g  Magnesium Sulfate received: No BMZ received: No Rhophylac:N/A MMR:N/A T-DaP:Given prenatally Flu: Yes RSV Vaccine received: No Transfusion:Yes  Immunizations received: Immunization  History  Administered Date(s) Administered   Influenza, Seasonal, Injecte, Preservative Fre 08/29/2023   Influenza,inj,Quad PF,6+ Mos 09/03/2017, 08/06/2019   Tdap 12/02/2017, 10/15/2019, 11/30/2023    Physical exam  Vitals:   01/18/24 1201 01/18/24 1238 01/18/24 1530 01/18/24 2033  BP: 127/76 128/77 134/83 128/83  Pulse: 74 72 78 79  Resp: 19 17 16 18   Temp: 97.6 F (36.4 C) 98.1 F (36.7 C) 98.2 F (36.8 C) 98.1 F (36.7 C)  TempSrc: Oral Oral Oral Oral  SpO2: 100% 100% 100% 100%  Weight:      Height:       General: alert, cooperative, and no distress Lochia: appropriate Uterine Fundus: firm Incision: Dressing is clean, dry, and intact DVT Evaluation: No evidence of DVT seen on physical exam. Labs: Lab Results  Component Value Date   WBC 10.6 (H) 01/19/2024   HGB 8.0 (L) 01/19/2024   HCT 22.8 (L) 01/19/2024   MCV 81.4 01/19/2024   PLT 197 01/19/2024      Latest Ref Rng & Units 01/18/2024    5:11 AM  CMP  Glucose 70 - 99 mg/dL 77   BUN 6 - 20 mg/dL 10   Creatinine 1.30 - 1.00 mg/dL 8.65   Sodium 784 - 696 mmol/L 133   Potassium 3.5 - 5.1 mmol/L 3.3   Chloride 98 - 111 mmol/L 102   CO2 22 - 32 mmol/L 23   Calcium 8.9 - 10.3 mg/dL 8.2    Edinburgh Score:    01/17/2024    3:23 PM  Edinburgh Postnatal Depression Scale Screening Tool  I have been able to laugh and see the funny side of things. 0  I have looked forward with enjoyment to things. 0  I have blamed myself unnecessarily when things went wrong. 2  I have been anxious or worried for no good reason. 1  I have felt scared or panicky for no good reason. 1  Things have been getting on top of me. 1  I have been so unhappy that I have had difficulty sleeping. 1  I have felt sad or miserable. 1  I have been so unhappy that I have been crying. 2  The thought of harming myself has occurred to me. 0  Edinburgh Postnatal Depression Scale Total 9   No data recorded  After visit meds:  Allergies as of  01/19/2024   No Known Allergies      Medication List     STOP taking these medications    aspirin EC 81 MG tablet   NIFEdipine 30 MG 24 hr tablet Commonly known as: PROCARDIA-XL/NIFEDICAL-XL       TAKE these medications    Acetaminophen Extra Strength 500 MG Tabs Take 2 tablets (1,000 mg total) by mouth every 6 (six) hours.   etonogestrel 68 MG Impl implant Commonly known as: NEXPLANON 1 each (68 mg total) by Subdermal route once.   FeroSul 325 (65 Fe) MG tablet Generic drug: ferrous sulfate Take 1 tablet (325 mg total) by mouth every other day.   furosemide 20 MG tablet Commonly known as: LASIX Take 1 tablet (20 mg total) by mouth daily for 5 days.   gabapentin 100 MG capsule Commonly known as: NEURONTIN Take 2 capsules (200 mg total) by mouth at bedtime.   ibuprofen 600 MG tablet Commonly known as: ADVIL Take 1 tablet (600 mg total) by mouth every 6 (six) hours.   oxyCODONE 5 MG immediate release tablet Commonly known as: Oxy IR/ROXICODONE Take 1 tablet (5 mg total) by mouth every 6 (six) hours as needed for moderate pain (pain score 4-6).   potassium chloride SA 20 MEQ tablet Commonly known as: KLOR-CON M Take 1 tablet (20 mEq total) by mouth daily for 5 days.   Prenatal 27-1 MG Tabs Take 1 tablet by mouth daily.         Discharge home in stable condition Infant Feeding: Bottle and Breast Infant Disposition: possibly rooming in Discharge instruction: per After Visit Summary and Postpartum booklet. Activity: Advance as tolerated. Pelvic rest for 6 weeks.  Diet: routine diet Future Appointments: Future Appointments  Date Time Provider Department Center  01/22/2024 10:00 AM [facility] NURSE [facility]  02/29/2024  8:35 AM Warden Fillers, MD [facility]   Follow up Visit:  Follow-up Information     [facility] Follow up in 1 week(s).   Specialty: Obstetrics and Gynecology Why: Incision  check Contact information: [address] [place] [address] [phone]               Message sent to [facility] 2/12  Please schedule this patient for a In person postpartum visit in 4 weeks with the following provider: Any provider. Additional Postpartum F/U:Incision check 1 week and BP check 2-3 days  High risk pregnancy complicated by: HTN Delivery mode:  C-Section, Low Transverse Anticipated Birth Control:  Nexplanon   01/19/2024 Sharon Seller, DO

## 2024-01-16 NOTE — Lactation Note (Signed)
This note was copied from a baby's chart. Lactation Consultation Note  Patient Name: Stacy Christian [id number] Date: 01/16/2024 Age:33 hours  RN stated mom was resting. She will call for [facility] when mom is ready to see me. Mom has been through a lot.   Maternal Data    Feeding    LATCH Score                    Lactation Tools Discussed/Used    Interventions    Discharge    Consult Status      Charyl Dancer 01/16/2024, 11:17 PM

## 2024-01-16 NOTE — H&P (Signed)
Faculty Practice H&P  Stacy Christian is a 33 y.o. female [id number] with IUP at [redacted]w[redacted]d presenting for repeat cesarean section for prior CS x 3 and PPROM. Pregnancy was been complicated by Hx Pre eclampsia,polyhydramnios, gestational hypertension, Hx classical CS .    Pt states she has been having no contractions, or vaginal bleeding.  Reports good fetal movement.  Had a gush of fluid at the clinic today and was fern positive.  Prenatal Course Source of Care:  [facility]  with onset of care at 11weeks  Pregnancy complications or risks: Patient Active Problem List   Diagnosis Date Noted   Polyhydramnios affecting pregnancy in third trimester 12/11/2023   History of prior pregnancy with intrauterine growth restricted newborn 11/19/2023   History of placenta abruption 09/12/2023   Supervision of high risk pregnancy, antepartum 07/25/2023   History of cesarean section 11/16/2019   Gestational hypertension 10/19/2019   History of neonatal death 2019/10/01   Hx of preeclampsia, prior pregnancy, currently pregnant 07/11/2019   Asthma    History of premature delivery, currently pregnant 08/07/2017   History of classical cesarean section 08/07/2017   She desires  declines  for contraception.  She plans to breastfeed, plans to bottle feed  Prenatal labs and studies: ABO, Rh: A/Positive/-- (08/28 1041) Antibody: Negative (08/28 1041) Rubella: 1.70 (08/28 1041) RPR: Non Reactive (01/07 1029)  HBsAg: Negative (08/28 1041)  HIV: Non Reactive (01/07 1029)  GBS:    2hr Glucola: negative Genetic screening: normal Anatomy US: normal  Past Medical History:  Past Medical History:  Diagnosis Date   Asthma    Preeclampsia    Preterm labor     Past Surgical History:  Past Surgical History:  Procedure Laterality Date   CESAREAN SECTION     CESAREAN SECTION N/A 11/16/2019   Procedure: CESAREAN SECTION;  Surgeon: Reva Bores, MD;  Location: [facility] LD ORS;  Service: Obstetrics;  Laterality: N/A;    TONSILLECTOMY      Obstetrical History:  OB History     Gravida  5   Para  3   Term  0   Preterm  3   AB  1   Living  2      SAB  1   IAB  0   Ectopic  0   Multiple  0   Live Births  3           Gynecological History:  OB History     Gravida  5   Para  3   Term  0   Preterm  3   AB  1   Living  2      SAB  1   IAB  0   Ectopic  0   Multiple  0   Live Births  3           Social History:  Social History   Socioeconomic History   Marital status: Single    Spouse name: Not on file   Number of children: Not on file   Years of education: Not on file   Highest education level: Not on file  Occupational History   Not on file  Tobacco Use   Smoking status: Light Smoker    Current packs/day: 0.00    Types: Cigarettes    Last attempt to quit: 05/23/2019    Years since quitting: 4.6   Smokeless tobacco: Never  Vaping Use   Vaping status: Never Used  Substance and Sexual Activity  Alcohol use: Not Currently    Comment: occasionally   Drug use: Not Currently    Types: Marijuana    Comment: working on decreasing   Sexual activity: Yes    Birth control/protection: None  Other Topics Concern   Not on file  Social History Narrative   Not on file   Social Drivers of Health   Financial Resource Strain: Not on file  Food Insecurity: No Food Insecurity (07/09/2019)   Hunger Vital Sign    Worried About Running Out of Food in the Last Year: Never true    Ran Out of Food in the Last Year: Never true  Transportation Needs: Unmet Transportation Needs (07/09/2019)   PRAPARE - Administrator, Civil Service (Medical): Yes    Lack of Transportation (Non-Medical): Yes  Physical Activity: Not on file  Stress: Not on file  Social Connections: Not on file    Family History:  Family History  Problem Relation Age of Onset   Heart disease Mother    Diabetes Mother    Hypertension Mother    Diabetes Father    Hypertension  Father    Heart disease Father    Cancer Maternal Grandmother    Diabetes Maternal Grandmother    Cancer Maternal Grandfather    Heart disease Paternal Grandmother    Heart disease Paternal Grandfather     Medications:  Prenatal vitamins,  No current facility-administered medications for this encounter.    Allergies: No Known Allergies  Review of Systems: -  Negative  Physical Exam: Last menstrual period 05/12/2023, unknown if currently breastfeeding. GENERAL: Well-developed, well-nourished female in no acute distress.  LUNGS: Clear to auscultation bilaterally.  HEART: Regular rate and rhythm. ABDOMEN: Soft, nontender, nondistended, gravid. EFW 5 lbs EXTREMITIES: Nontender, no edema, 2+ distal pulses. FHT:  Baseline rate 150 bpm      Pertinent Labs/Studies:   Lab Results  Component Value Date   WBC 10.8 (H) 01/12/2024   HGB 10.8 (L) 01/12/2024   HCT 30.6 (L) 01/12/2024   MCV 84.3 01/12/2024   PLT 185 01/12/2024    Assessment : Stacy Christian is a 33 y.o. [id number] at [redacted]w[redacted]d being admitted for cesarean section secondary to PPROM with prior CS x 3 with history of classical cesarean delivery.  Plan: The risks of cesarean section discussed with the patient included but were not limited to: bleeding which may require transfusion or reoperation; infection which may require antibiotics; injury to bowel, bladder, ureters or other surrounding organs; injury to the fetus; need for additional procedures including hysterectomy in the event of a life-threatening hemorrhage; placental abnormalities wth subsequent pregnancies, incisional problems, thromboembolic phenomenon and other postoperative/anesthesia complications. The patient concurred with the proposed plan, giving informed written consent for the procedure.   Patient has been NPO since 8 AM and will remain NPO for procedure.  Preoperative prophylactic Ancef ordered on call to the OR.    Celedonio Savage, MD 01/16/2024, 12:36  PM

## 2024-01-16 NOTE — Transfer of Care (Signed)
Immediate Anesthesia Transfer of Care Note  Patient: Stacy Christian  Procedure(s) Performed: CESAREAN SECTION  Patient Location: PACU  Anesthesia Type:Spinal  Level of Consciousness: awake, alert , oriented, and patient cooperative  Airway & Oxygen Therapy: Patient Spontanous Breathing  Post-op Assessment: Report given to RN and Post -op Vital signs reviewed and stable  Post vital signs: Reviewed and stable  Last Vitals:  Vitals Value Taken Time  BP 136/75 01/16/24 1800  Temp    Pulse 79 01/16/24 1810  Resp 20 01/16/24 1810  SpO2 98 % 01/16/24 1810  Vitals shown include unfiled device data.  Last Pain:  Vitals:   01/16/24 1425  TempSrc: Oral         Complications: No notable events documented.

## 2024-01-16 NOTE — Anesthesia Procedure Notes (Signed)
Spinal  Start time: 01/16/2024 4:36 PM End time: 01/16/2024 4:39 PM Reason for block: surgical anesthesia Staffing Performed: anesthesiologist  Anesthesiologist: Shelton Silvas, MD Performed by: Shelton Silvas, MD Authorized by: Shelton Silvas, MD   Preanesthetic Checklist Completed: patient identified, IV checked, site marked, risks and benefits discussed, surgical consent, monitors and equipment checked, pre-op evaluation and timeout performed Spinal Block Patient position: sitting Prep: DuraPrep and site prepped and draped Location: L3-4 Injection technique: single-shot Needle Needle type: Pencan  Needle gauge: 24 G Needle length: 10 cm Needle insertion depth: 10 cm Additional Notes Patient tolerated well. No immediate complications.  Functioning IV was confirmed and monitors were applied. Sterile prep and drape, including hand hygiene and sterile gloves were used. The patient was positioned and the back was prepped. The skin was anesthetized with lidocaine. Free flow of clear CSF was obtained prior to injecting local anesthetic into the CSF. The spinal needle aspirated freely following injection. The needle was carefully withdrawn. The patient tolerated the procedure well.

## 2024-01-16 NOTE — Op Note (Signed)
Cesarean Section Operative Note   Patient: Stacy Christian  Date of Procedure: 01/16/2024  Procedure: Repeat Low Transverse Cesarean   Indications: previous uterine incision: low transverse x 3  Pre-operative Diagnosis: previous cesarean section spontaneous rupture of membranes.   Post-operative Diagnosis: Same  TOLAC Candidate: No  Surgeon: Surgeons and Role:    * Celedonio Savage, MD - Primary    * [place] Fillers, MD - Assisting  Assistants: none  An experienced assistant was required given the standard of surgical care given the complexity of the case.  This assistant was needed for exposure, dissection, suctioning, retraction, instrument exchange, assisting with delivery with administration of fundal pressure, and for overall help during the procedure.   Anesthesia: spinal  Anesthesiologist: No responsible provider has been recorded for the case.   Antibiotics: Cefazolin and Azithromycin   Estimated Blood Loss: 1550 ml   Total IV Fluids: 1000 ml  Urine Output:  100 cc OF clear urine  Specimens: None  Complications: Postpartum hemorrhage   Indications: Stacy Christian is a 33 y.o. [id number] with an IUP [redacted]w[redacted]d presenting for unscheduled, urgent cesarean secondary to the indications listed above. Clinical course notable for rupture of membranes in the clinic today.  No labor.  History of 3 prior C-sections..  The risks of cesarean section discussed with the patient included but were not limited to: bleeding which may require transfusion or reoperation; infection which may require antibiotics; injury to bowel, bladder, ureters or other surrounding organs; injury to the fetus; need for additional procedures including hysterectomy in the event of a life-threatening hemorrhage; placental abnormalities with subsequent pregnancies, incisional problems, thromboembolic phenomenon and other postoperative/anesthesia complications. The patient concurred with the proposed plan,  giving informed written consent for the procedure. Patient was n.p.o. from 8 AM.. Anesthesia and OR aware. Preoperative prophylactic antibiotics and SCDs ordered on call to the OR.   Findings: Viable infant in cephalic presentation, no nuchal cord present. Apgars 8, 9, . Weight 2450 g. Clear amniotic fluid. Normal placenta, three vessel cord.  Extremely thin uterus, not completely see-through but would classify as window.  Normal bilateral fallopian tubes, Normal bilateral ovaries. Moderate adhesive disease, anterior abdominal adhesions to the lower uterine segment, bladder adhesion high on the uterus, considerable amount of scar tissue .  Procedure Details: A Time Out was held and the above information confirmed. The patient received intravenous antibiotics and had sequential compression devices applied to her lower extremities preoperatively. The patient was taken back to the operative suite where spinal anesthesia was administered. After induction of anesthesia, the patient was draped and prepped in the usual sterile manner and placed in a dorsal supine position with a leftward tilt. A low transverse skin incision was made with scalpel and carried down through the subcutaneous tissue to the fascia. Fascial incision was made and extended transversely. The fascia was separated from the underlying rectus tissue superiorly and inferiorly sharply. The rectus muscles were separated in the midline bluntly and the peritoneum was entered sharply using hemostats and Metzenbaum scissors.  A lower uterine adhesion from the anterior abdominal wall was taken down.  An Alexis retractor was placed to aid in visualization of the uterus. The utero-vesical peritoneal reflection was incised transversely and the bladder flap was bluntly freed from the lower uterine segment. A low transverse uterine incision was made. The infant was successfully delivered from cephalic presentation, the umbilical cord was clamped after 1 minute.  Cord ph was not sent, and cord blood was obtained  for evaluation. The placenta was removed Intact and appeared normal. The uterine incision was closed with a single layer running unlocked suture of 0-Monocryl. Due to ongoing bleeding from the hysterotomy figure of eight sutures of 0 Monocryl were placed after which there was excellent hemostasis. The abdomen and the pelvis were cleared of all clot and debris and the Jon Gills was removed. Hemostasis was confirmed on all surfaces.  A layer of Interceed was applied to the hysterotomy.  The peritoneum was was not reapproximated due to the large amount of scar tissue and no peritoneum to close.. The fascia was then closed using 0 Vicryl in a running fashion. The subcutaneous layer was not reapproximated. The skin was closed with a 4-0 vicryl subcuticular stitch. The patient tolerated the procedure well. Sponge, lap, instrument and needle counts were correct x 2. She was taken to the recovery room in stable condition.  Disposition: PACU - hemodynamically stable.    Signed: Celedonio Savage, MD Attending Family Medicine Physician, [facility] for [facility], [facility]

## 2024-01-16 NOTE — Anesthesia Preprocedure Evaluation (Addendum)
Anesthesia Evaluation  Patient identified by MRN, date of birth, ID band Patient awake    Reviewed: Allergy & Precautions, NPO status , Patient's Chart, lab work & pertinent test results  Airway Mallampati: I       Dental no notable dental hx.    Pulmonary asthma , Current Smoker   Pulmonary exam normal        Cardiovascular hypertension, Pt. on medications Normal cardiovascular exam     Neuro/Psych negative neurological ROS  negative psych ROS   GI/Hepatic negative GI ROS, Neg liver ROS,,,  Endo/Other  negative endocrine ROS    Renal/GU negative Renal ROS     Musculoskeletal   Abdominal   Peds  Hematology negative hematology ROS (+)   Anesthesia Other Findings   Reproductive/Obstetrics (+) Pregnancy                             Anesthesia Physical Anesthesia Plan  ASA: 2  Anesthesia Plan: Spinal   Post-op Pain Management: Minimal or no pain anticipated   Induction:   PONV Risk Score and Plan: 0 and Treatment may vary due to age or medical condition  Airway Management Planned: Natural Airway  Additional Equipment: None  Intra-op Plan:   Post-operative Plan:   Informed Consent: I have reviewed the patients History and Physical, chart, labs and discussed the procedure including the risks, benefits and alternatives for the proposed anesthesia with the patient or authorized representative who has indicated his/her understanding and acceptance.       Plan Discussed with: CRNA  Anesthesia Plan Comments: (Lab Results      Component                Value               Date                      WBC                      10.8 (H)            01/16/2024                HGB                      11.5 (L)            01/16/2024                HCT                      33.0 (L)            01/16/2024                MCV                      84.4                01/16/2024                PLT                       171                 01/16/2024           )  Anesthesia Quick Evaluation

## 2024-01-16 NOTE — Progress Notes (Signed)
Called to the PACU regarding continued bleeding and passage of clot once arriving to the PACU.  On arrival there was an additional roughly 500 cc of blood in the bed.  Lower uterine sweep was performed with another 100 200 cc of blood.  The fundus of the uterus was firm and to below the umbilicus.  She continued to bleed so code hemorrhage was called and Humphrey Rolls was placed.  Patient was also given 800 mcg of rectal Cytotec.  Stat CBC, DIC panel ordered.  2 units on hold.  Bleeding improved and will reevaluate in 30 minutes to 1 hour.

## 2024-01-16 NOTE — Progress Notes (Signed)
After her visit patient went to bathroom and exited reporting to staff leaking fluid Pants were wet in the hallway  Brought back to room 10  Sterile spec was performed + pooling +Fern   Last ate at 8:30 (chips) Last water was about 11  ROM 11:00 ish  Discussed with LD team as patient will need repeat CS  Planning inpatient nexplanon Patient will go to [facility] but plans to stop a her house for bag (only 7 min away)

## 2024-01-17 LAB — CBC
HCT: 19.3 % — ABNORMAL LOW (ref 36.0–46.0)
Hematocrit: 32.5 % — ABNORMAL LOW (ref 34.0–46.6)
Hemoglobin: 11.2 g/dL (ref 11.1–15.9)
Hemoglobin: 7.1 g/dL — ABNORMAL LOW (ref 12.0–15.0)
MCH: 29.9 pg (ref 26.6–33.0)
MCH: 30.6 pg (ref 26.0–34.0)
MCHC: 34.5 g/dL (ref 31.5–35.7)
MCHC: 36.8 g/dL — ABNORMAL HIGH (ref 30.0–36.0)
MCV: 87 fL (ref 79–97)
MCV: 83.2 fL (ref 80.0–100.0)
Platelets: 176 10*3/uL (ref 150–450)
Platelets: 190 10*3/uL (ref 150–400)
RBC: 3.75 x10E6/uL — ABNORMAL LOW (ref 3.77–5.28)
RBC: 2.32 MIL/uL — ABNORMAL LOW (ref 3.87–5.11)
RDW: 11.8 % (ref 11.7–15.4)
RDW: 12.1 % (ref 11.5–15.5)
WBC: 10.3 10*3/uL (ref 3.4–10.8)
WBC: 25.5 10*3/uL — ABNORMAL HIGH (ref 4.0–10.5)
nRBC: 0 % (ref 0.0–0.2)

## 2024-01-17 LAB — COMPREHENSIVE METABOLIC PANEL
ALT: 11 [IU]/L (ref 0–32)
AST: 14 [IU]/L (ref 0–40)
Albumin: 3.5 g/dL — ABNORMAL LOW (ref 3.9–4.9)
Alkaline Phosphatase: 126 [IU]/L — ABNORMAL HIGH (ref 44–121)
BUN/Creatinine Ratio: 12 (ref 9–23)
BUN: 6 mg/dL (ref 6–20)
Bilirubin Total: <0.2 mg/dL (ref 0.0–1.2)
CO2: 18 mmol/L — ABNORMAL LOW (ref 20–29)
Calcium: 8.6 mg/dL — ABNORMAL LOW (ref 8.7–10.2)
Chloride: 103 mmol/L (ref 96–106)
Creatinine, Ser: 0.52 mg/dL — ABNORMAL LOW (ref 0.57–1.00)
Globulin, Total: 3.2 g/dL (ref 1.5–4.5)
Glucose: 75 mg/dL (ref 70–99)
Potassium: 4 mmol/L (ref 3.5–5.2)
Sodium: 137 mmol/L (ref 134–144)
Total Protein: 6.7 g/dL (ref 6.0–8.5)
eGFR: 127 mL/min/{1.73_m2} (ref >59)

## 2024-01-17 LAB — PROTEIN / CREATININE RATIO, URINE
Creatinine, Urine: 59.2 mg/dL
Protein, Ur: 7.4 mg/dL
Protein/Creat Ratio: 125 mg/g{creat} (ref 0–200)

## 2024-01-17 LAB — CREATININE, SERUM
Creatinine, Ser: 0.51 mg/dL (ref 0.44–1.00)
GFR, Estimated: >60 mL/min (ref >60)

## 2024-01-17 LAB — RPR: RPR Ser Ql: NONREACTIVE

## 2024-01-17 MED ORDER — FUROSEMIDE 20 MG PO TABS
20.0000 mg | ORAL_TABLET | Freq: Every day | ORAL | Status: DC
  Administered 2024-01-18: 20 mg via ORAL
  Filled 2024-01-17 (×2): qty 1

## 2024-01-17 MED ORDER — LIDOCAINE HCL 1 % IJ SOLN
0.0000 mL | Freq: Once | INTRAMUSCULAR | Status: DC | PRN
  Filled 2024-01-17: qty 20

## 2024-01-17 MED ORDER — FERROUS SULFATE 325 (65 FE) MG PO TABS
325.0000 mg | ORAL_TABLET | Freq: Every day | ORAL | Status: DC
  Administered 2024-01-18: 325 mg via ORAL
  Filled 2024-01-17: qty 1

## 2024-01-17 MED ORDER — ETONOGESTREL 68 MG ~~LOC~~ IMPL
68.0000 mg | DRUG_IMPLANT | Freq: Once | SUBCUTANEOUS | Status: DC
  Filled 2024-01-17: qty 1

## 2024-01-17 NOTE — Anesthesia Postprocedure Evaluation (Signed)
Anesthesia Post Note  Patient: Stacy Christian  Procedure(s) Performed: CESAREAN SECTION     Patient location during evaluation: PACU Anesthesia Type: Spinal Level of consciousness: oriented and awake and alert Pain management: pain level controlled Vital Signs Assessment: post-procedure vital signs reviewed and stable Respiratory status: spontaneous breathing, respiratory function stable and patient connected to nasal cannula oxygen Cardiovascular status: blood pressure returned to baseline and stable Postop Assessment: no headache, no backache and no apparent nausea or vomiting Anesthetic complications: no   No notable events documented.              Shelton Silvas

## 2024-01-17 NOTE — Progress Notes (Signed)
Post Partum Day 1 RLTCS Subjective: no complaints, up ad lib, voiding, tolerating PO, and + flatus  Objective: Blood pressure (!) 106/50, pulse 68, temperature 98.4 F (36.9 C), temperature source Oral, resp. rate 18, height 5\' 6"  (1.676 m), weight 87.8 kg, last menstrual period 05/12/2023, SpO2 100%, unknown if currently breastfeeding.  Physical Exam:  General: alert, cooperative, and no distress Lochia: appropriate Uterine Fundus: firm Incision: healing well DVT Evaluation: No significant calf/ankle edema.  Recent Labs    01/16/24 1827 01/17/24 0717  HGB 9.4* 7.1*  HCT 26.7* 19.3*    Assessment/Plan: Postpartum - Contraception: Plan is for Nexplanon - MOF: Breast and bottle - Rh status: Positive - Rubella status: Immune - Dispo: D/C POD2/3 - Consults: Lactation  Acute anemia blood loss due to postpartum hemorrhage - Had pre-existing anemia and then had postpartum hemorrhage - HgB is 7.1 this morning but she is asymptomatic.  - Plan is for PO iron but will recheck labs tomorrow and possibly to IV Iron.   GHTN - Bp currently normotensive. Will start Lasix. Check CMP tomorrow.   Neonatal - Doing well    LOS: 1 day   Milas Hock 01/17/2024, 5:24 PM

## 2024-01-17 NOTE — Progress Notes (Signed)
CSW made [facility] CPS report due to infant's positive UDS for THC.   CSW delivered car seat to MOB.   Celso Sickle, LCSW Clinical Social Worker [facility] Cell#: [phone]

## 2024-01-17 NOTE — Lactation Note (Signed)
This note was copied from a baby's chart. Lactation Consultation Note  Patient Name: Stacy Christian WUJWJ'X Date: 01/17/2024 Age:33 hours Reason for consult: Initial assessment;Infant < 6lbs;Late-preterm 34-36.6wks  P3, [redacted]w[redacted]d.  < 6 lbs. Mother is breast and formula feeding. Reviewed LPI feeding behavior and reviewed volume guidelines.  SLP provided Dr. Theora Gianotti Ultra Premie bottle.  Discussed bottle feeding first with goal today of 12-14 ml based on infant's weight.  Offer breast or allow baby to nuzzle if desired after feeding. Limit total feeding time to 30 min. Recommend baby feed on cue but at least q 3 hours.   Mother has pumped x 1 expressing 4 ml with 24 mm flanges which mother states were comfortable.  Encouraged pumping q 3 hours.  Social worker is going to help mother get certified with WIC.  [facility] referral sent.  Suggest calling for help as needed.  Maternal Data Has patient been taught Hand Expression?: Yes Does the patient have breastfeeding experience prior to this delivery?: Yes How long did the patient breastfeed?: pumped for 5 mos with 1st child who was in NICU, breastfed for one month with second child.  Feeding Mother's Current Feeding Choice: Breast Milk and Formula  Lactation Tools Discussed/Used Tools: Pump;Flanges Flange Size: 24 Breast pump type: Double-Electric Breast Pump;Manual Pump Education: Setup, frequency, and cleaning;Milk Storage Reason for Pumping: stimulation and supplementation Pumping frequency: q 3 hours for 15 min Pumped volume: 4 mL  Interventions Interventions: Education;Breast feeding basics reviewed;DEBP;LC Services brochure Consult Status Consult Status: Follow-up Date: 01/18/24 Follow-up type: In-patient   Hardie Pulley  RN, IBCLC 01/17/2024, 1:58 PM

## 2024-01-17 NOTE — Clinical Social Work Maternal (Signed)
CLINICAL SOCIAL WORK MATERNAL/CHILD NOTE  Patient Details  Name: Stacy Christian MRN: [id number] Date of Birth: 12/26/1990  Date:  01-07-24  Clinical Social Worker Initiating Note:  Celso Sickle, [place] Date/Time: Initiated:  01/17/24/1102     Child's Name:  Rolly Salter   Biological Parents:  Mother   Need for Interpreter:  None   Reason for Referral:  Current Substance Use/Substance Use During Pregnancy  , Other (Comment) (Needs Assistance with a carseat)   Address:  [address] [place] [address]    Phone number:  [phone] (home)     Additional phone number:   Household Members/Support Persons (HM/SP):   Household Member/Support Person 1, Household Member/Support Person 2   HM/SP Name Relationship DOB or Age  HM/SP -1 Mykhi Clifton Kovacic son 11/16/19  HM/SP -2 Mya Leavy Cella daughter 12/07/17  HM/SP -3        HM/SP -4        HM/SP -5        HM/SP -6        HM/SP -7        HM/SP -8          Natural Supports (not living in the home):  Immediate Family   Professional Supports: None   Employment: Unemployed   Type of Work:     Education:  Some Materials engineer arranged:    Surveyor, quantity Resources:  [employer]   Other Resources:  Archivist Considerations Which May Impact Care:    Strengths:  Ability to meet basic needs  , Pediatrician chosen   Psychotropic Medications:         Pediatrician:    Armed forces operational officer area  Pediatrician List:   [facility] for [employer]    [place]    [facility]      Pediatrician Fax Number:    Risk Factors/Current Problems:  Substance Use  , Basic Needs     Cognitive State:  Able to Concentrate  , Alert  , Linear Thinking  , Goal Oriented     Mood/Affect:  Comfortable  , Calm  , Interested  , Bright  , Happy     CSW Assessment: CSW met with MOB at bedside to complete psychosocial assessment. CSW  introduced self and explained reason for consult. MOB was welcoming, pleasant, and remained engaged during assessment. MOB reported that she resides with two older children and noted that her boyfriend is present at her home at times. MOB reported that FOB is not involved and did not disclose his name during the assessment. MOB reported having some items needed to care for infant including clothing, pack n play, and receiving blankets. MOB reported that a close friend is getting her diapers and wipes. MOB reported needing assistance with getting a car seat. CSW informed MOB about the hospital car seat program, MOB reported that she is unable to pay $30 for a car seat as she is not working. MOB shared that she has been out of work to care for her mother who has had two strokes. CSW offered kinds words. CSW inquired about MOB's support system, MOB reported that her sister and boyfriend are supports.   CSW inquired about MOB's mental health history. MOB denied any mental health history. MOB denied any history of postpartum depression. CSW inquired about how MOB was feeling emotionally since giving birth, MOB reported  that she is feeling like a lot of her stress is relieved in her overall life and not just with the pregnancy. CSW acknowledged and validated MOB's feelings. MOB presented calm and did not demonstrate any acute mental health signs/symptoms. CSW assessed for safety, MOB denied SI, HI, and domestic violence.   CSW provided education regarding the baby blues period vs. perinatal mood disorders, discussed treatment and gave resources for mental health follow up if concerns arise.  CSW recommends self-evaluation during the postpartum time period using the New Mom Checklist from Postpartum Progress and encouraged MOB to contact a medical professional if symptoms are noted at any time.    CSW provided review of Sudden Infant Death Syndrome (SIDS) precautions.    CSW informed MOB about the hospital drug  screen policy due to documented substance use. MOB confirmed marijuana use and denied any additional substance use. CSW informed MOB that infant's UDS was positive for [facility] and a CPS report would be made. MOB verbalized understanding and reported that she was familiar. MOB denied any questions.   CSW inquired about any additional needs for resources/supports, MOB reported no needs.   CSW completed [facility] referral.  CSW will return and deliver car seat. CSW will make [facility] CPS report due to infant's positive UDS for THC.   CSW identifies no further need for intervention and no barriers to discharge at this time.   CSW Plan/Description:  Sudden Infant Death Syndrome (SIDS) Education, Perinatal Mood and Anxiety Disorder (PMADs) Education, No Further Intervention Required/No Barriers to Discharge, Hospital Drug Screen Policy Information, Child Protective Service Report  , Other Information/Referral to [facility], CSW Will Continue to Monitor Umbilical Cord Tissue Drug Screen Results and Make Report if Stacy Rushing, LCSW 12-Jul-2024, 11:04 AM

## 2024-01-17 NOTE — Lactation Note (Signed)
This note was copied from a baby's chart. Lactation Consultation Note  Patient Name: Girl Shastina Rua NUUVO'Z Date: 01/17/2024 Age:33 hours  Baby was formula fed during the night d/t mom not feeling well.    Maternal Data    Feeding    LATCH Score                    Lactation Tools Discussed/Used    Interventions    Discharge    Consult Status      Ashwini Jago, Diamond Nickel 01/17/2024, 6:01 AM

## 2024-01-18 ENCOUNTER — Other Ambulatory Visit: Payer: Medicaid Other

## 2024-01-18 ENCOUNTER — Other Ambulatory Visit (HOSPITAL_COMMUNITY): Payer: Self-pay

## 2024-01-18 ENCOUNTER — Ambulatory Visit: Payer: Medicaid Other

## 2024-01-18 DIAGNOSIS — Z30017 Encounter for initial prescription of implantable subdermal contraceptive: Secondary | ICD-10-CM

## 2024-01-18 LAB — CBC
HCT: 16.9 % — ABNORMAL LOW (ref 36.0–46.0)
Hemoglobin: 6.1 g/dL — CL (ref 12.0–15.0)
MCH: 30.2 pg (ref 26.0–34.0)
MCHC: 36.1 g/dL — ABNORMAL HIGH (ref 30.0–36.0)
MCV: 83.7 fL (ref 80.0–100.0)
Platelets: 167 10*3/uL (ref 150–400)
RBC: 2.02 MIL/uL — ABNORMAL LOW (ref 3.87–5.11)
RDW: 12.1 % (ref 11.5–15.5)
WBC: 12.3 10*3/uL — ABNORMAL HIGH (ref 4.0–10.5)
nRBC: 0 % (ref 0.0–0.2)

## 2024-01-18 LAB — BASIC METABOLIC PANEL
Anion gap: 8 (ref 5–15)
BUN: 10 mg/dL (ref 6–20)
CO2: 23 mmol/L (ref 22–32)
Calcium: 8.2 mg/dL — ABNORMAL LOW (ref 8.9–10.3)
Chloride: 102 mmol/L (ref 98–111)
Creatinine, Ser: 0.57 mg/dL (ref 0.44–1.00)
GFR, Estimated: >60 mL/min (ref >60)
Glucose, Bld: 77 mg/dL (ref 70–99)
Potassium: 3.3 mmol/L — ABNORMAL LOW (ref 3.5–5.1)
Sodium: 133 mmol/L — ABNORMAL LOW (ref 135–145)

## 2024-01-18 LAB — PREPARE RBC (CROSSMATCH)

## 2024-01-18 MED ORDER — POTASSIUM CHLORIDE CRYS ER 20 MEQ PO TBCR
20.0000 meq | EXTENDED_RELEASE_TABLET | Freq: Every day | ORAL | 0 refills | Status: AC
  Filled 2024-01-18: qty 5, 5d supply, fill #0

## 2024-01-18 MED ORDER — SODIUM CHLORIDE 0.9% IV SOLUTION: Freq: Once | INTRAVENOUS | Status: AC

## 2024-01-18 MED ORDER — ACETAMINOPHEN 500 MG PO TABS
1000.0000 mg | ORAL_TABLET | Freq: Four times a day (QID) | ORAL | 0 refills | Status: AC
  Filled 2024-01-18: qty 30, 4d supply, fill #0

## 2024-01-18 MED ORDER — GABAPENTIN 100 MG PO CAPS
200.0000 mg | ORAL_CAPSULE | Freq: Every day | ORAL | 0 refills | Status: AC
  Filled 2024-01-18: qty 30, 15d supply, fill #0

## 2024-01-18 MED ORDER — OXYCODONE HCL 5 MG PO TABS
5.0000 mg | ORAL_TABLET | Freq: Four times a day (QID) | ORAL | 0 refills | Status: AC | PRN
  Filled 2024-01-18: qty 20, 5d supply, fill #0

## 2024-01-18 MED ORDER — ACETAMINOPHEN 325 MG PO TABS
650.0000 mg | ORAL_TABLET | Freq: Once | ORAL | Status: DC
Start: 2024-01-18 — End: 2024-01-18

## 2024-01-18 MED ORDER — FERROUS SULFATE 325 (65 FE) MG PO TABS
325.0000 mg | ORAL_TABLET | ORAL | 0 refills | Status: AC
  Filled 2024-01-18: qty 30, 60d supply, fill #0

## 2024-01-18 MED ORDER — POTASSIUM CHLORIDE CRYS ER 20 MEQ PO TBCR
20.0000 meq | EXTENDED_RELEASE_TABLET | Freq: Every day | ORAL | Status: DC
  Administered 2024-01-18: 20 meq via ORAL
  Filled 2024-01-18 (×2): qty 1

## 2024-01-18 MED ORDER — FUROSEMIDE 20 MG PO TABS
20.0000 mg | ORAL_TABLET | Freq: Every day | ORAL | 0 refills | Status: AC
  Filled 2024-01-18: qty 5, 5d supply, fill #0

## 2024-01-18 MED ORDER — DIPHENHYDRAMINE HCL 25 MG PO CAPS
25.0000 mg | ORAL_CAPSULE | Freq: Once | ORAL | Status: AC
  Administered 2024-01-18: 25 mg via ORAL
  Filled 2024-01-18: qty 1

## 2024-01-18 MED ORDER — IBUPROFEN 600 MG PO TABS
600.0000 mg | ORAL_TABLET | Freq: Four times a day (QID) | ORAL | 0 refills | Status: AC
  Filled 2024-01-18: qty 30, 8d supply, fill #0

## 2024-01-18 NOTE — Progress Notes (Signed)
Post Partum Day 2 RLTCS Subjective: no complaints, up ad lib, voiding, tolerating PO, and + flatus  Objective: Blood pressure 117/70, pulse 70, temperature 97.8 F (36.6 C), temperature source Oral, resp. rate 20, height 5\' 6"  (1.676 m), weight 87.8 kg, last menstrual period 05/12/2023, SpO2 100%, unknown if currently breastfeeding.  Physical Exam:  General: alert, cooperative, and no distress Lochia: appropriate Uterine Fundus: firm Incision: healing well, dressing changed.  DVT Evaluation: No significant calf/ankle edema.  Recent Labs    01/17/24 0717 01/18/24 0511  HGB 7.1* 6.1*  HCT 19.3* 16.9*    Assessment/Plan: Postpartum - Contraception: Plan is for Nexplanon. Will place later today.  - MOF: Breast and bottle - Rh status: Positive - Rubella status: Immune - Dispo: D/C POD2/3 - she is hopeful for discharge today. Likely okay for d/c later this afternoon.  - Consults: Lactation  Acute anemia blood loss due to postpartum hemorrhage - Had pre-existing anemia and then had postpartum hemorrhage - HgB is 6.1 this morning and she is trying to nurse. Discussed transfusion and risks/benefits. I recommend transfusion. She accepts.  - Started PO iron - will continue.   GHTN - Bp currently normotensive. Started Lasix.  - BMP shows hypokalemia. Will add Kdur.   Neonatal - Doing well    LOS: 2 days   Stacy Christian 01/18/2024, 9:07 AM

## 2024-01-18 NOTE — Procedures (Signed)
     GYNECOLOGY PROCEDURE NOTE  Stacy Christian is a 33 y.o. [id number] here for Nexplanon insertion.    Nexplanon Insertion Procedure Patient identified, informed consent performed, consent signed.   Patient does understand that irregular bleeding is a very common side effect of this medication. She was advised to have backup contraception for one week after placement. Pregnancy test in clinic today was negative.  Appropriate time out taken.    Patient's left arm was prepped and draped in the usual sterile fashion. The ruler used to measure and mark insertion area.  Patient was prepped with alcohol swab and then injected with 3 ml of 1% lidocaine.  She was prepped with betadine, Nexplanon removed from packaging,  Device confirmed in needle, then inserted full length of needle and withdrawn per handbook instructions. Nexplanon was able to palpated in the patient's arm; patient palpated the insert herself. There was minimal blood loss.  Patient insertion site covered with guaze and a pressure bandage to reduce any bruising.    The patient tolerated the procedure well and was given post procedure instructions.    Milas Hock, MD, FACOG Obstetrician & Gynecologist, [facility] for [facility], [facility]

## 2024-01-18 NOTE — Progress Notes (Signed)
CSW received and acknowledges consult for EDPS of 9.  Consult screened out due to 9 on EDPS does not warrant a CSW consult.  MOB whom scores are greater than 9/yes to question 10 on Edinburgh Postpartum Depression Screen warrants a CSW consult.   Celso Sickle, LCSW Clinical Social Worker [facility] Cell#: [phone]

## 2024-01-19 ENCOUNTER — Other Ambulatory Visit (HOSPITAL_COMMUNITY): Payer: Self-pay

## 2024-01-19 LAB — BPAM RBC
Blood Product Expiration Date: 202503082359
Blood Product Expiration Date: 202503082359
ISSUE DATE / TIME: 202502140941
ISSUE DATE / TIME: 202502141209
Unit Type and Rh: 6200
Unit Type and Rh: 6200

## 2024-01-19 LAB — CBC
HCT: 22.8 % — ABNORMAL LOW (ref 36.0–46.0)
Hemoglobin: 8 g/dL — ABNORMAL LOW (ref 12.0–15.0)
MCH: 28.6 pg (ref 26.0–34.0)
MCHC: 35.1 g/dL (ref 30.0–36.0)
MCV: 81.4 fL (ref 80.0–100.0)
Platelets: 197 10*3/uL (ref 150–400)
RBC: 2.8 MIL/uL — ABNORMAL LOW (ref 3.87–5.11)
RDW: 14.5 % (ref 11.5–15.5)
WBC: 10.6 10*3/uL — ABNORMAL HIGH (ref 4.0–10.5)
nRBC: 0 % (ref 0.0–0.2)

## 2024-01-19 LAB — TYPE AND SCREEN
ABO/RH(D): A POS
Antibody Screen: NEGATIVE
Unit division: 0
Unit division: 0

## 2024-01-19 NOTE — Plan of Care (Signed)
  Problem: Education: Goal: Knowledge of General Education information will improve Description: Including pain rating scale, medication(s)/side effects and non-pharmacologic comfort measures Outcome: Completed/Met   Problem: Health Behavior/Discharge Planning: Goal: Ability to manage health-related needs will improve Outcome: Completed/Met   Problem: Clinical Measurements: Goal: Ability to maintain clinical measurements within normal limits will improve Outcome: Completed/Met Goal: Will remain free from infection Outcome: Completed/Met Goal: Diagnostic test results will improve Outcome: Completed/Met Goal: Respiratory complications will improve Outcome: Completed/Met Goal: Cardiovascular complication will be avoided Outcome: Completed/Met   Problem: Activity: Goal: Risk for activity intolerance will decrease Outcome: Completed/Met   Problem: Nutrition: Goal: Adequate nutrition will be maintained Outcome: Completed/Met   Problem: Coping: Goal: Level of anxiety will decrease Outcome: Completed/Met   Problem: Elimination: Goal: Will not experience complications related to bowel motility Outcome: Completed/Met Goal: Will not experience complications related to urinary retention Outcome: Completed/Met   Problem: Pain Managment: Goal: General experience of comfort will improve and/or be controlled Outcome: Completed/Met   Problem: Safety: Goal: Ability to remain free from injury will improve Outcome: Completed/Met   Problem: Skin Integrity: Goal: Risk for impaired skin integrity will decrease Outcome: Completed/Met   Problem: Education: Goal: Knowledge of the prescribed therapeutic regimen will improve Outcome: Completed/Met Goal: Understanding of sexual limitations or changes related to disease process or condition will improve Outcome: Completed/Met Goal: Individualized Educational Video(s) Outcome: Completed/Met   Problem: Self-Concept: Goal: Communication of  feelings regarding changes in body function or appearance will improve Outcome: Completed/Met   Problem: Skin Integrity: Goal: Demonstration of wound healing without infection will improve Outcome: Completed/Met   Problem: Education: Goal: Knowledge of condition will improve Outcome: Completed/Met Goal: Individualized Educational Video(s) Outcome: Completed/Met Goal: Individualized Newborn Educational Video(s) Outcome: Completed/Met   Problem: Activity: Goal: Will verbalize the importance of balancing activity with adequate rest periods Outcome: Completed/Met Goal: Ability to tolerate increased activity will improve Outcome: Completed/Met   Problem: Coping: Goal: Ability to identify and utilize available resources and services will improve Outcome: Completed/Met   Problem: Life Cycle: Goal: Chance of risk for complications during the postpartum period will decrease Outcome: Completed/Met   Problem: Role Relationship: Goal: Ability to demonstrate positive interaction with newborn will improve Outcome: Completed/Met   Problem: Skin Integrity: Goal: Demonstration of wound healing without infection will improve Outcome: Completed/Met

## 2024-01-19 NOTE — Lactation Note (Signed)
This note was copied from a baby's chart.  NICU Lactation Consultation Note  Patient Name: Stacy Christian [id number]'J Date: 01/19/2024 Age:33 hours  Reason for consult: Follow-up assessment; NICU baby; Infant < 6lbs; Late-preterm 34-36.6wks; Maternal discharge; Other (Comment) (SGA)  SUBJECTIVE Visited with family of 39 37/61 weeks old AGA NICU female; baby "Stacy Christian" got admitted due to prematurity and poor feedings. Stacy Christian is a P4 and this is her third baby in the NICU; she [facility] (didn't have a good experience), she has two living children at home, her youngest one is 13 y.o. Her plan is to do both, direct breastfeeding along with pumping and bottle feeding with breastmilk/formula; "Stacy Christian" has latched before and she feels like like she's doing well at the breast, asked her to call for latched assistance when needed. She has also been pumping in her room downstairs but noticed it hasn't been consistently, her milk already came in though. Stacy Christian is getting discharged from [facility] today. Reviewed discharge education, pump settings and the importance of consistent pumping for the prevention of engorgement and to protect her supply. Offered a [facility] loaner pump since she doesn't have one at home and she politely declined, she voiced she'll just use a hand pump in the meantime.   OBJECTIVE Infant data: Mother's Current Feeding Choice: Breast Milk and Formula  O2 Device: Room Air  Infant feeding assessment IDFTS - Readiness: 2   Maternal data: [id number] C-Section, Low Transverse Pumping frequency: 2 times/24 hours Pumped volume: 40 mL (or more) Flange Size: 24 Risk factor for low/delayed milk supply:: C/S, prematurity, infant separation, SGA, < 6 lbs, PPH of 2413 cc  WIC Program: Yes WIC Referral Sent?: Yes What county?: [place]  ASSESSMENT Infant: Feeding Status: Scheduled 8-11-2-5 Feeding method: Bottle Nipple Type: Dr.  Levert Feinstein Preemie  Maternal: Milk volume: Normal  INTERVENTIONS/PLAN Interventions: Interventions: Breast feeding basics reviewed; DEBP; Education; CDC Guidelines for Breast Pump Cleaning; NICU Pumping Log Discharge Education: Engorgement and breast care Tools: Pump; Flanges Pump Education: Setup, frequency, and cleaning; Milk Storage  Plan: Encouraged pumping every 3 hours on maintain mode, ideally 8 pumping sessions/24 hours She'll bring all pump parts to baby's room after her discharge She'll continue taking baby "Stacy Christian" to breast on feeding cues around feeding times and will call for assistance PRN Family will continue advancing on bottle feedings Verify pump issuance  No other support person at this time. All questions and concerns answered, family to contact [facility] services PRN.  Consult Status: NICU follow-up NICU Follow-up type: Verify absence of engorgement; Verify DEBP issuance   Stacy Christian 01/19/2024, 2:53 PM

## 2024-01-21 ENCOUNTER — Ambulatory Visit (HOSPITAL_COMMUNITY): Payer: Self-pay

## 2024-01-21 LAB — SURGICAL PATHOLOGY

## 2024-01-21 NOTE — Lactation Note (Signed)
This note was copied from a baby's chart.  NICU Lactation Consultation Note  Patient Name: Stacy Christian [id number] Date: 01/21/2024 Age:33 days  Reason for consult: Follow-up assessment; NICU baby; Infant < 6lbs; Late-preterm 34-36.6wks; Other (Comment) (SGA)  SUBJECTIVE Visited with family of 69 17/67 weeks old AGA NICU female; Stacy Christian is pumping for baby "Stacy Christian" using her hand pump at home. Noticed that pumping hasn't been consistent; baby is mostly on Similac 24 calorie formula. Stacy Christian is getting discharged today. Reviewed discharge education and the importance of consistent pumping after feedings/attempts at the breast to protect her supply. Offered [facility] loaner again but Ms. Lighty doesn't have the $ 30.00 deposit at this time. Emailed [facility] director and called [facility] to ask for assistance with pump issuance. [facility] already has a visit scheduled for 02/27 but will reach out today to assess pump issuance. No other support person at this time. All questions and concerns answered, family to contact [facility] services PRN.  OBJECTIVE Infant data: Mother's Current Feeding Choice: Breast Milk and Formula  O2 Device: Room Air  Infant feeding assessment IDFTS - Readiness: 1 IDFTS - Quality: 3   Maternal data: [id number] C-Section, Low Transverse Pumping frequency: 5-6 times/24 hours Pumped volume: 40 mL (40-50 ml)  WIC Program: Yes WIC Referral Sent?: Yes What county?: [place] Pump: Manual  ASSESSMENT Infant: Feeding Status: Ad lib Feeding method: Bottle Nipple Type: Dr. Levert Feinstein Preemie  Maternal: Milk volume: Low  INTERVENTIONS/PLAN Interventions: Discharge Education: Outpatient recommendation  Plan: Consult Status: Complete   Amaree Leeper S Yajaira Doffing 01/21/2024, 2:11 PM

## 2024-01-22 ENCOUNTER — Ambulatory Visit: Payer: Medicaid Other

## 2024-01-23 ENCOUNTER — Telehealth (HOSPITAL_COMMUNITY): Payer: Self-pay

## 2024-01-23 NOTE — Telephone Encounter (Signed)
01/23/2024 0945  Name: Stacy Christian MRN: [id number] DOB: [birth year]  Reason for Call:  Transition of Care Hospital Discharge Call  Contact Status: Patient Contact Status: Complete  Language assistant needed:          Follow-Up Questions: Do You Have Any Concerns About Your Health As You Heal From Delivery?: No Do You Have Any Concerns About Your Infants Health?: Yes What Concerns Do You Have About Your Baby?: Patient states that baby is doing well. Patient reports that she is breastfeeding and bottle feeding. Patient states that baby has been drinking 2 ounces about every 2 hours and having good diapers. Patient states that due to transportation challenges she has not been able to take baby to her follow up appointment with the pediatrician. [facility] reports that [facility]. RN explained the importance of pediatrician follow up for baby. RN told patient to call the pediatrician office after our call and schedule an appointment. RN encouraged patient to verbalize her transportation concerns to the pediatrician office to see what resources may be available. Patient states that her brother may be able to provide transportation but that he works 3rd shift. RN will reach out to [facility] nurse to see if they can follow up with patient as well. Patient has no other questions or concerns.  Edinburgh Postnatal Depression Scale:  In the Past 7 Days: I have been able to laugh and see the funny side of things.: As much as I always could I have looked forward with enjoyment to things.: As much as I ever did I have blamed myself unnecessarily when things went wrong.: Not very often I have been anxious or worried for no good reason.: Yes, sometimes I have felt scared or panicky for no good reason.: No, not at all Things have been getting on top of me.: No, most of the time I have coped quite well I have been so unhappy that I have had difficulty sleeping.: Not  very often I have felt sad or miserable.: No, not at all I have been so unhappy that I have been crying.: Only occasionally The thought of harming myself has occurred to me.: Never Edinburgh Postnatal Depression Scale Total: 6  PHQ2-9 Depression Scale:     Discharge Follow-up: Edinburgh score requires follow up?: No Patient was advised of the following resources:: Breastfeeding Support Group, Support Group  Post-discharge interventions: Reviewed Newborn Safe Sleep Practices  Signature  Signe Colt

## 2024-01-24 ENCOUNTER — Ambulatory Visit: Payer: Medicaid Other

## 2024-01-24 ENCOUNTER — Encounter (HOSPITAL_COMMUNITY)
Admission: RE | Admit: 2024-01-24 | Discharge: 2024-01-24 | Disposition: A | Discharge status: Home / Self Care | Payer: Medicaid Other | Source: Ambulatory Visit | Attending: Maternal & Fetal Medicine | Admitting: Maternal & Fetal Medicine

## 2024-01-24 ENCOUNTER — Other Ambulatory Visit (HOSPITAL_COMMUNITY): Payer: Medicaid Other

## 2024-01-26 ENCOUNTER — Encounter (HOSPITAL_COMMUNITY)
Admission: AD | Disposition: A | Discharge status: Home / Self Care | Payer: Self-pay | Source: Home / Self Care | Attending: Obstetrics and Gynecology

## 2024-01-26 SURGERY — Surgical Case
Anesthesia: Regional

## 2024-01-31 MED FILL — Etonogestrel Subdermal Implant 68 MG: SUBCUTANEOUS | Qty: 1 | Status: AC

## 2024-02-29 ENCOUNTER — Ambulatory Visit: Payer: Medicaid Other | Admitting: Obstetrics and Gynecology

## 2024-06-10 ENCOUNTER — Encounter: Payer: Self-pay | Admitting: Obstetrics and Gynecology

## 2024-06-10 ENCOUNTER — Ambulatory Visit: Admitting: Obstetrics and Gynecology

## 2024-06-10 ENCOUNTER — Other Ambulatory Visit: Payer: Self-pay

## 2024-06-10 ENCOUNTER — Other Ambulatory Visit (HOSPITAL_COMMUNITY)
Admission: RE | Admit: 2024-06-10 | Discharge: 2024-06-10 | Disposition: A | Discharge status: Home / Self Care | Source: Ambulatory Visit | Attending: Obstetrics and Gynecology | Admitting: Obstetrics and Gynecology

## 2024-06-10 VITALS — BP 184/124 | HR 65 | Wt 184.6 lb

## 2024-06-10 DIAGNOSIS — Z975 Presence of (intrauterine) contraceptive device: Secondary | ICD-10-CM | POA: Insufficient documentation

## 2024-06-10 DIAGNOSIS — Z3046 Encounter for surveillance of implantable subdermal contraceptive: Secondary | ICD-10-CM | POA: Diagnosis not present

## 2024-06-10 DIAGNOSIS — R030 Elevated blood-pressure reading, without diagnosis of hypertension: Secondary | ICD-10-CM

## 2024-06-10 DIAGNOSIS — Z202 Contact with and (suspected) exposure to infections with a predominantly sexual mode of transmission: Secondary | ICD-10-CM | POA: Insufficient documentation

## 2024-06-10 DIAGNOSIS — Z3042 Encounter for surveillance of injectable contraceptive: Secondary | ICD-10-CM

## 2024-06-10 MED ORDER — MEDROXYPROGESTERONE ACETATE 150 MG/ML IM SUSY
150.0000 mg | PREFILLED_SYRINGE | Freq: Once | INTRAMUSCULAR | Status: AC
  Administered 2024-06-10: 150 mg via INTRAMUSCULAR

## 2024-06-10 NOTE — Progress Notes (Signed)
 GYNECOLOGY OFFICE VISIT NOTE  History:   Stacy Christian is a 33 y.o. [id number] here today for nexplanon  removal and depo initiation. She denies any abnormal vaginal discharge, bleeding, pelvic pain or other concerns.    Past Medical History:  Diagnosis Date   Asthma    Preeclampsia    Preterm labor     Past Surgical History:  Procedure Laterality Date   CESAREAN SECTION     CESAREAN SECTION N/A 11/16/2019   Procedure: CESAREAN SECTION;  Surgeon: Stacy Glenys RAMAN, MD;  Location: [facility];  Service: Obstetrics;  Laterality: N/A;   CESAREAN SECTION N/A 01/16/2024   Procedure: CESAREAN SECTION;  Surgeon: Stacy Norleen GAILS, MD;  Location: [facility];  Service: Obstetrics;  Laterality: N/A;   TONSILLECTOMY      The following portions of the patient's history were reviewed and updated as appropriate: allergies, current medications, past family history, past medical history, past social history, past surgical history and problem list.   Health Maintenance:  Normal pap and negative HRHPV on 08/01/2023.  No mammogram yet indicated.   Review of Systems:  Pertinent items noted in HPI and remainder of comprehensive ROS otherwise negative.  Physical Exam:  BP (!) 184/124   Pulse 65   Wt 184 lb 9.6 oz (83.7 kg)   Breastfeeding No   BMI 29.80 kg/m  CONSTITUTIONAL: Well-developed, well-nourished female in no acute distress.  HEENT:  Normocephalic, atraumatic. External right and left ear normal. No scleral icterus.  NECK: Normal range of motion, supple, no masses noted on observation SKIN: No rash noted. Not diaphoretic. No erythema. No pallor. MUSCULOSKELETAL: Normal range of motion. No edema noted. NEUROLOGIC: Alert and oriented to person, place, and time. Normal muscle tone coordination.  PSYCHIATRIC: Normal mood and affect. Normal behavior. Normal judgment and thought content. CARDIOVASCULAR: Normal heart rate noted RESPIRATORY: Effort and breath sounds normal, no problems with  respiration noted ABDOMEN: No masses noted. No other overt distention noted.   PELVIC: Deferred  Labs and Imaging No results found for this or any previous visit (from the past week). No results found.    Assessment and Plan:      1. Nexplanon  in place (Primary)  2. STD exposure - Cervicovaginal ancillary only( [place]) - RPR+HBsAg+HCVAb+...  3. Elevated BP without diagnosis of hypertension Severe range BP on recheck, asymptomatic. Will log BP at home for 3 days and upload.    Desires nexplanon  removal and initiation of depo-provera .   Nexplanon  removal PROCEDURE NOTE Ms. Stacy Christian is a 33 y.o. [id number] here for Nexplanon  removal. Last pap smear was on 08/01/2023 and was normal.  No other gynecologic concerns.  Patient was given informed consent, she signed consent form.  Appropriate time out taken.  Patient's left arm was prepped and draped in the usual sterile fashion. Device located in upper left arm.  Patient was prepped with alcohol swab and then injected with 3 ml of 1% lidocaine .  She was prepped with betadine , Nexplanon  removed through small incision at distal end. There was minimal blood loss.  Patient insertion site covered with guaze and a pressure bandage to reduce any bruising.  The patient tolerated the procedure well and was given post procedure instructions.   Depo-Provera  given.  Routine preventative health maintenance measures emphasized. Please refer to After Visit Summary for other counseling recommendations.   No follow-ups on file.     Stacy Shropshire, MD OB Fellow, Faculty Practice [facility] [place] 06/10/2024  5:37 PM

## 2024-06-11 LAB — RPR+HBSAG+HCVAB+...
HIV Screen 4th Generation wRfx: NONREACTIVE
Hep C Virus Ab: NONREACTIVE
Hepatitis B Surface Ag: NEGATIVE
RPR Ser Ql: NONREACTIVE

## 2024-06-11 LAB — CERVICOVAGINAL ANCILLARY ONLY
Chlamydia: NEGATIVE
Comment: NEGATIVE
Comment: NEGATIVE
Comment: NORMAL
Neisseria Gonorrhea: NEGATIVE
Trichomonas: NEGATIVE

## 2024-08-27 ENCOUNTER — Ambulatory Visit
